# Patient Record
Sex: Male | Born: 1965
Health system: Southern US, Community
[De-identification: ages and names within clinical notes are randomized; demographics above are authoritative.]

## PROBLEM LIST (undated history)

## (undated) DIAGNOSIS — R319 Hematuria, unspecified: Secondary | ICD-10-CM

## (undated) DIAGNOSIS — Z87442 Personal history of urinary calculi: Secondary | ICD-10-CM

## (undated) DIAGNOSIS — I10 Essential (primary) hypertension: Secondary | ICD-10-CM

## (undated) DIAGNOSIS — D649 Anemia, unspecified: Secondary | ICD-10-CM

## (undated) DIAGNOSIS — G4733 Obstructive sleep apnea (adult) (pediatric): Secondary | ICD-10-CM

## (undated) DIAGNOSIS — E669 Obesity, unspecified: Secondary | ICD-10-CM

## (undated) DIAGNOSIS — N529 Male erectile dysfunction, unspecified: Secondary | ICD-10-CM

## (undated) DIAGNOSIS — R9431 Abnormal electrocardiogram [ECG] [EKG]: Secondary | ICD-10-CM

## (undated) DIAGNOSIS — E559 Vitamin D deficiency, unspecified: Secondary | ICD-10-CM

## (undated) HISTORY — DX: Obesity, unspecified: E66.9

## (undated) HISTORY — DX: Anemia, unspecified: D64.9

## (undated) HISTORY — PX: TONSILLECTOMY AND ADENOIDECTOMY: SHX28

## (undated) HISTORY — DX: Male erectile dysfunction, unspecified: N52.9

## (undated) HISTORY — DX: Obstructive sleep apnea (adult) (pediatric): G47.33

## (undated) HISTORY — DX: Vitamin D deficiency, unspecified: E55.9

## (undated) HISTORY — DX: Abnormal electrocardiogram (ECG) (EKG): R94.31

## (undated) HISTORY — PX: TONSILLECTOMY: SUR1361

## (undated) HISTORY — DX: Essential (primary) hypertension: I10

## (undated) HISTORY — DX: Hematuria, unspecified: R31.9

---

## 2011-03-01 ENCOUNTER — Emergency Department: Payer: Self-pay | Admitting: Emergency Medicine

## 2011-03-04 ENCOUNTER — Ambulatory Visit: Payer: Self-pay | Admitting: Orthopedic Surgery

## 2011-03-04 LAB — BASIC METABOLIC PANEL
Anion Gap: 10 (ref 7–16)
BUN: 16 mg/dL (ref 7–18)
Calcium, Total: 8.8 mg/dL (ref 8.5–10.1)
EGFR (African American): >60
EGFR (Non-African Amer.): >60
Glucose: 88 mg/dL (ref 65–99)
Osmolality: 276 (ref 275–301)
Sodium: 138 mmol/L (ref 136–145)

## 2011-03-04 LAB — PROTIME-INR: Prothrombin Time: 12.1 secs (ref 11.5–14.7)

## 2011-03-04 LAB — CBC
MCH: 27.8 pg (ref 26.0–34.0)
MCV: 83 fL (ref 80–100)
Platelet: 188 10*3/uL (ref 150–440)
RDW: 14.1 % (ref 11.5–14.5)
WBC: 7.7 10*3/uL (ref 3.8–10.6)

## 2011-03-04 LAB — SEDIMENTATION RATE: Erythrocyte Sed Rate: 4 mm/hr (ref 0–15)

## 2011-03-07 ENCOUNTER — Ambulatory Visit: Payer: Self-pay | Admitting: Orthopedic Surgery

## 2011-03-07 HISTORY — PX: ANKLE SURGERY: SHX546

## 2011-07-14 ENCOUNTER — Ambulatory Visit: Payer: Self-pay

## 2013-05-08 ENCOUNTER — Ambulatory Visit: Payer: Self-pay | Admitting: Family Medicine

## 2013-09-19 LAB — CBC AND DIFFERENTIAL
HEMATOCRIT: 41 % (ref 41–53)
Hemoglobin: 13.2 g/dL — AB (ref 13.5–17.5)
Neutrophils Absolute: 4 /uL
PLATELETS: 222 10*3/uL (ref 150–399)
WBC: 6.4 10^3/mL

## 2013-09-19 LAB — BASIC METABOLIC PANEL
BUN: 11 mg/dL (ref 4–21)
Creatinine: 1 mg/dL (ref 0.6–1.3)
Glucose: 88 mg/dL
Potassium: 4.3 mmol/L (ref 3.4–5.3)
SODIUM: 142 mmol/L (ref 137–147)

## 2013-09-19 LAB — HEPATIC FUNCTION PANEL
ALT: 15 U/L (ref 10–40)
AST: 15 U/L (ref 14–40)
Alkaline Phosphatase: 79 U/L (ref 25–125)
Bilirubin, Total: 0.3 mg/dL

## 2013-09-19 LAB — LIPID PANEL
Cholesterol: 178 mg/dL (ref 0–200)
HDL: 49 mg/dL (ref 35–70)
LDL CALC: 117 mg/dL
Triglycerides: 61 mg/dL (ref 40–160)

## 2013-09-19 LAB — TSH: TSH: 0.64 u[IU]/mL (ref 0.41–5.90)

## 2013-09-27 ENCOUNTER — Ambulatory Visit: Payer: Self-pay | Admitting: Family Medicine

## 2013-10-09 DIAGNOSIS — R1012 Left upper quadrant pain: Secondary | ICD-10-CM | POA: Insufficient documentation

## 2013-10-09 DIAGNOSIS — E559 Vitamin D deficiency, unspecified: Secondary | ICD-10-CM | POA: Insufficient documentation

## 2014-05-11 NOTE — Op Note (Signed)
PATIENT NAME:  Angel Chase, Angel Chase MR#:  [id number] DATE OF BIRTH:  03-Oct-1965  DATE OF PROCEDURE:  03/07/2011  PREOPERATIVE DIAGNOSIS: Left bimalleolar ankle fracture.   POSTOPERATIVE DIAGNOSIS: Left bimalleolar ankle fracture.  PROCEDURE: Open reduction internal fixation of left bimalleolar ankle fracture.   SURGEON: Timoteo Gaul, MD   ANESTHESIA: General.   TOURNIQUET TIME: 116 minutes.   ESTIMATED BLOOD LOSS: 50 mL.  IMPLANTS: Synthes seven hole 3.5 LCDC plate with two 4-0 cannulated screws for medial malleolar fixation.   INDICATIONS FOR PROCEDURE: The patient is a 49 year old male who sustained an injury to his left ankle at work when he was run over by a tractor. The patient had a bimalleolar ankle fracture by x-ray with some displacement. It was felt that the patient would be best served with surgical fixation of the bimalleolar ankle fracture. The patient understood the risks of surgery including infection, bleeding, nerve or blood vessel injury, ankle stiffness, osteoarthritis, persistent pain, failure of the hardware, malunion, nonunion, painful hardware requiring removal, wound healing problems, and the need for further surgery. Medical complications include DVT and pulmonary embolism, myocardial infarction, stroke, respiratory failure, pneumonia, and death. The patient understood these risks and wished to proceed with surgery.   PROCEDURE NOTE: The patient was met in the preop area. I signed the left leg with the word yes according to the hospital's right site protocol. This was after verbally confirming with the patient that the left side was the correct site of surgery.   I updated the patient's history and physical examination. I answered all the patient's questions. His wife is at the bedside. The patient was then taken to the operating room where he was placed supine on the operative table. All bony prominences were adequately padded. A tourniquet was applied to the left  upper thigh and the patient was prepped and draped in a sterile fashion.   A time-out was performed to verify the patient's name, date of birth, medical record number, correct site of surgery, and correct procedure to be performed. It was also used to verify the patient had received antibiotics and that all appropriate instruments, implants, and radiographic studies were available in the room. Once all in attendance were in agreement, the case began.   The patient's proposed surgical incisions were drawn out with a surgical marker after fluoroscopic images of the ankle were performed to verify the fracture locations. The patient then had his left lower extremity exsanguinated using an Esmarch. The tourniquet was applied to 275 mmHg and inflated for a total of 116 minutes. A #15 blade was then used to make a lateral incision centered over the fracture site. Care was taken to avoid injury to the superficial peroneal nerve. The subcutaneous tissues were carefully dissected using a Metzenbaum scissor and pick-up. This revealed the underlying fracture of the fibula. The fibular fracture was an oblique fracture extending in an oblique fashion through the coronal and sagittal planes. The fracture was cleared of overlying soft tissue and the hematoma was curetted and irrigated from the fracture site. The fracture was reduced with fracture clamps. The plate was then selected. It was felt that a seven hole LCDC plate would provide the best fixation for this fracture. The plate was slightly bent to allow for the flare of the distal end of the lateral malleolus. Two 3.5 mm bimalleolar screws were then placed above and below the fracture to fix the fracture. Additional proximal and distal screws were then placed. Despite fixation there was  still some instability at the fracture site. Therefore, a single locking screw was placed through the fracture in a medial and lateral direction to provide some stability to the sagittal  component of the fracture. A #2 fiber wire was also placed around the proximal fragment just proximal to the fracture site to help stabilize the fibula fracture given the comminution at the fracture site. The final distal 2.5 mm bicortical screws were then placed, one at the distalmost hole and one at the proximal most hole. Final images were performed. The patient had a slight degree of sag at the fracture site but the anterior cortex appeared anatomic. There was a slight posterior spike at the inferior aspect of the fracture line. The wound was copiously irrigated and a moist Ray-Tec was placed in the wound. The attention was then turned to the medial side. A vertical incision was made based over the medial malleolus. The incision extended just below the tip of the medial malleolus and extended just above the fracture site. The subcutaneous tissues were carefully dissected taking care to avoid any injury to the saphenous nerve or vein. The fracture again was curetted free of hematoma and irrigated carefully. A dental pick was used to perform a reduction of the medial malleolus. Prior to the fracture reduction, the joint was observed to have no evidence of focal chondral loss. With a dental pick holding fracture reduction, two K wires were placed in a parallel fashion through the medial malleolus and across the fracture site. Two cannulated long threaded 50 mm screws were placed across the fracture site allowing for reduction of the fracture. Final x-ray images were performed. The mortise was symmetric and there was no widening of the syndesmosis. With a cotton test the fibula showed no evidence of instability at the syndesmosis. The fracture of the fibula was above the syndesmosis. The position of the plate and screws did not involve any penetration into the ankle joint by x-ray. All wounds were copiously irrigated. The subcutaneous tissues were closed with 2-0 Vicryl and the skin approximated with staples. A dry  sterile dressing along with an AO splint was applied to the left lower extremity. The patient was then extubated and transferred to a hospital bed and brought to the PAC-U in stable condition. I was scrubbed and present for the entire case and all sharp and instrument counts were correct at the conclusion of the case.   I met with the patient's family in the postop recovery room to let them know the case had gone without complication and the patient was stable in the recovery room. The patient will remain nonweightbearing on the left lower extremity on crutches or a walker until he follows up with me in the office in 7 to 10 days. He will be started on enteric-coated aspirin 325 mg daily for DVT prophylaxis. He will be provided Percocet for pain control as well as Phenergan for postop nausea.   ____________________________ Timoteo Gaul, MD klk:drc D: 03/08/2011 12:28:39 ET T: 03/08/2011 12:49:53 ET JOB#: [id number]  cc: Timoteo Gaul, MD, <Dictator> Timoteo Gaul MD ELECTRONICALLY SIGNED 03/10/2011 17:00

## 2014-07-10 ENCOUNTER — Encounter (INDEPENDENT_AMBULATORY_CARE_PROVIDER_SITE_OTHER): Payer: Self-pay

## 2014-07-10 ENCOUNTER — Encounter: Payer: Self-pay | Admitting: Family Medicine

## 2014-07-10 ENCOUNTER — Ambulatory Visit (INDEPENDENT_AMBULATORY_CARE_PROVIDER_SITE_OTHER): Payer: BLUE CROSS/BLUE SHIELD | Admitting: Family Medicine

## 2014-07-10 VITALS — BP 128/80 | HR 90 | Temp 98.6°F | Resp 16 | Ht 73.0 in | Wt 308.1 lb

## 2014-07-10 DIAGNOSIS — E559 Vitamin D deficiency, unspecified: Secondary | ICD-10-CM

## 2014-07-10 DIAGNOSIS — I10 Essential (primary) hypertension: Secondary | ICD-10-CM | POA: Diagnosis not present

## 2014-07-10 DIAGNOSIS — N529 Male erectile dysfunction, unspecified: Secondary | ICD-10-CM | POA: Insufficient documentation

## 2014-07-10 DIAGNOSIS — R310 Gross hematuria: Secondary | ICD-10-CM | POA: Insufficient documentation

## 2014-07-10 MED ORDER — TELMISARTAN-HCTZ 80-12.5 MG PO TABS: 1.0000 | ORAL_TABLET | Freq: Every day | ORAL | Status: DC

## 2014-07-10 NOTE — Progress Notes (Signed)
Name: Angel Chase   MRN: [id number]    DOB: 25-Feb-1965   Date:07/10/2014       Progress Note  Subjective  Chief Complaint  Chief Complaint  Patient presents with  . Hypertension    Patient states that he is compliant with his medication and is not checking blood pressures at home  . Vitamin D Deficiency    Patient states that he has finished Vitamin D 40981 units weekly.    Hypertension This is a chronic problem. The problem is controlled. Pertinent negatives include no blurred vision, chest pain, headaches or palpitations. Past treatments include angiotensin blockers and diuretics. The current treatment provides significant improvement. There are no compliance problems.  There is no history of angina, kidney disease or CAD/MI.      Past Medical History  Diagnosis Date  . Vitamin D deficiency   . Obesity   . Obstructive sleep apnea   . Hypertension   . ED (erectile dysfunction)   . Hematuria   . EKG abnormalities     Past Surgical History  Procedure Laterality Date  . Ankle surgery Left 03/07/2011    Family History  Problem Relation Age of Onset  . Diabetes Father   . Breast cancer Mother   . Hypertension Father   . Hypertension Mother   . Pancreatic disease Sister   . Diabetes Sister   . Sleep apnea Sister     History   Social History  . Marital Status: Married    Spouse Name: Sherrie  . Number of Children: 1  . 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 Smoking status: Never Smoker   . Smokeless tobacco: Not on file  . Alcohol Use: No  . Drug Use: No  . Sexual Activity: Yes   Other Topics Concern  . Not on file   Social History Narrative     Current outpatient prescriptions:  .  tadalafil (CIALIS) 20 MG tablet, Take 1 tablet by mouth daily., Disp: , Rfl:  .  telmisartan-hydrochlorothiazide (MICARDIS HCT) 80-12.5 MG per tablet, Take 1 tablet by mouth daily., Disp: , Rfl:    No Known Allergies   Review of Systems  Eyes: Negative for blurred vision.  Cardiovascular: Negative for chest pain and palpitations.  Musculoskeletal: Negative for myalgias, back pain and joint pain.  Neurological: Negative for headaches.      Objective  Filed Vitals:   07/10/14 0847  BP: 128/80  Pulse: 90  Temp: 98.6 F (37 C)  Resp: 16  Height:  (1.854 m)  Weight: 308 lb 2 oz (139.765 kg)  SpO2: 96%    Physical Exam  Constitutional: He is oriented to person, place, and time and well-developed, well-nourished, and in no distress.  HENT:  Head: Normocephalic and atraumatic.  Cardiovascular: Normal rate and regular rhythm.   Pulmonary/Chest: Effort normal and breath sounds normal.  Musculoskeletal: He exhibits no edema.  Neurological: He is alert and oriented to person, place, and time.  Skin: Skin is warm and dry.  Nursing note and vitals reviewed.      No results found for this or any previous visit (from the past 2160 hour(s)).   Assessment & Plan 1. Essential hypertension Blood pressure at goal. Continue present therapy. - telmisartan-hydrochlorothiazide (MICARDIS HCT) 80-12.5 MG per tablet; Take 1 tablet by mouth daily.  Dispense: 90 tablet; Refill: 1  2. Avitaminosis D Patient has finished the 99  week prescription for vitamin D 50,000 units. Repeat levels today. - Vitamin D (25 hydroxy)  There are no diagnoses linked to this encounter.  Billi Bright Asad A. Faylene Kurtz [facility] 07/10/2014 8:59 AM

## 2014-07-11 ENCOUNTER — Telehealth: Payer: Self-pay

## 2014-07-11 LAB — VITAMIN D 25 HYDROXY (VIT D DEFICIENCY, FRACTURES): Vit D, 25-Hydroxy: 28.6 ng/mL — ABNORMAL LOW (ref 30.0–100.0)

## 2014-07-11 NOTE — Telephone Encounter (Signed)
-----   Message from Ellyn Hack, MD sent at 07/11/2014 10:34 AM EDT ----- Vitamin D level is improved from 19.2 to 28.6 to taking 50,000 units every week for 12 weeks. Patient may continue with OTC vitamin D 2000 IUs daily. Repeat levels in 3 months

## 2014-07-11 NOTE — Telephone Encounter (Signed)
Spoke with pt. Pt. Advised of results and verbalized understanding. Patient will call with any questions or concerns. [facility]

## 2014-07-23 ENCOUNTER — Other Ambulatory Visit: Payer: Self-pay | Admitting: Family Medicine

## 2014-10-13 ENCOUNTER — Encounter: Payer: Self-pay | Admitting: Family Medicine

## 2014-10-13 ENCOUNTER — Ambulatory Visit (INDEPENDENT_AMBULATORY_CARE_PROVIDER_SITE_OTHER): Payer: BLUE CROSS/BLUE SHIELD | Admitting: Family Medicine

## 2014-10-13 VITALS — BP 126/78 | HR 94 | Temp 98.6°F | Resp 17 | Ht 73.0 in | Wt 306.1 lb

## 2014-10-13 DIAGNOSIS — E559 Vitamin D deficiency, unspecified: Secondary | ICD-10-CM | POA: Diagnosis not present

## 2014-10-13 NOTE — Progress Notes (Signed)
Name: Angel Chase   MRN: [id number]    DOB: May 07, 1965   Date:10/13/2014       Progress Note  Subjective  Chief Complaint  Chief Complaint  Patient presents with  . Follow-up    3 mo  . Labs Only    fasting  . Hypertension    HPI Pt. Is here for Vitamin D deficiency. Level of Vitamin D was 28.6 in June 2016. He is taking OTC Vitamin D 2000 IUs. He feels well, no fatigue, myalgias, or other symptoms.  Past Medical History  Diagnosis Date  . Vitamin D deficiency   . Obesity   . Obstructive sleep apnea   . Hypertension   . ED (erectile dysfunction)   . Hematuria   . EKG abnormalities     Past Surgical History  Procedure Laterality Date  . Ankle surgery Left 03/07/2011    Family History  Problem Relation Age of Onset  . Diabetes Father   . Breast cancer Mother   . Hypertension Father   . Hypertension Mother   . Pancreatic disease Sister   . Diabetes Sister   . Sleep apnea Sister     Social History   Social History  . Marital Status: Married    Spouse Name: Sherrie  . Number of Children: 1  . 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 Smoking status: Never Smoker   . Smokeless tobacco: Not on file  . Alcohol Use: No  . Drug Use: No  . Sexual Activity: Yes   Other Topics Concern  . Not on file   Social History Narrative     Current outpatient prescriptions:  .  tadalafil (CIALIS) 20 MG tablet, Take 1 tablet by mouth daily., Disp: , Rfl:  .  telmisartan-hydrochlorothiazide (MICARDIS HCT) 80-12.5 MG per tablet, Take 1 tablet by mouth daily., Disp: 90 tablet, Rfl: 1  No Known Allergies   Review of Systems  Constitutional: Negative for fever, chills, weight loss and malaise/fatigue.   Objective  Filed Vitals:   10/13/14 0831  BP: 126/78  Pulse: 94  Temp: 98.6 F (37 C)  TempSrc: Oral  Resp: 17  Height:  (1.854 m)  Weight: 306 lb 1.6 oz (138.846 kg)  SpO2: 95%     Physical Exam  Constitutional: He is well-developed, well-nourished, and in no distress.  Cardiovascular: Normal rate and regular rhythm.   Pulmonary/Chest: Effort normal and breath sounds normal.  Abdominal: Soft. Bowel sounds are normal.  Musculoskeletal: He exhibits no edema.  Nursing note and vitals reviewed.   Assessment & Plan  1. Avitaminosis D  - Vitamin D (25 hydroxy)   Syed Asad A. Faylene Kurtz [facility] 10/13/2014 8:38 AM

## 2014-10-14 LAB — VITAMIN D 25 HYDROXY (VIT D DEFICIENCY, FRACTURES): Vit D, 25-Hydroxy: 29.7 ng/mL — ABNORMAL LOW (ref 30.0–100.0)

## 2014-12-31 ENCOUNTER — Telehealth: Payer: Self-pay | Admitting: Family Medicine

## 2014-12-31 DIAGNOSIS — I10 Essential (primary) hypertension: Secondary | ICD-10-CM

## 2014-12-31 NOTE — Telephone Encounter (Signed)
Patient had appointment for December, however, he had to reschedule due to provider not being in the office. He rescheduled for 01-27-15 and would like to know if you would refill his marcardis, he will not have enough to last until his appointment. Please send to [email or web address].

## 2015-01-02 MED ORDER — TELMISARTAN-HCTZ 80-12.5 MG PO TABS: 1.0000 | ORAL_TABLET | Freq: Every day | ORAL | Status: DC

## 2015-01-02 NOTE — Telephone Encounter (Signed)
Medication has been refilled and sent to [facility] °

## 2015-01-15 ENCOUNTER — Ambulatory Visit: Payer: BLUE CROSS/BLUE SHIELD | Admitting: Family Medicine

## 2015-01-27 ENCOUNTER — Ambulatory Visit (INDEPENDENT_AMBULATORY_CARE_PROVIDER_SITE_OTHER): Payer: BLUE CROSS/BLUE SHIELD | Admitting: Family Medicine

## 2015-01-27 ENCOUNTER — Encounter: Payer: Self-pay | Admitting: Family Medicine

## 2015-01-27 VITALS — BP 128/76 | HR 97 | Temp 98.7°F | Resp 16 | Ht 73.0 in | Wt 315.9 lb

## 2015-01-27 DIAGNOSIS — E785 Hyperlipidemia, unspecified: Secondary | ICD-10-CM

## 2015-01-27 DIAGNOSIS — I10 Essential (primary) hypertension: Secondary | ICD-10-CM | POA: Diagnosis not present

## 2015-01-27 DIAGNOSIS — E559 Vitamin D deficiency, unspecified: Secondary | ICD-10-CM

## 2015-01-27 NOTE — Progress Notes (Deleted)
Subjective:     Patient ID: Angel Chase, male   DOB: [birth year], 50 y.o.   MRN: [id number]  Hypertension This is a chronic problem. The problem is unchanged. The problem is controlled. Pertinent negatives include no blurred vision, chest pain, headaches, palpitations or shortness of breath. Past treatments include angiotensin blockers and diuretics.  Hyperlipidemia This is a chronic problem. The problem is uncontrolled. Recent lipid tests were reviewed and are high. Pertinent negatives include no chest pain or shortness of breath. He is currently on no antihyperlipidemic treatment.   Pt. Is also here for Vitamin D deficiency. Last level obtained in September 2016 was 29.6. Pt. Has been on Vitamin D OTC therapy.   Review of Systems  Eyes: Negative for blurred vision.  Respiratory: Negative for shortness of breath.   Cardiovascular: Negative for chest pain and palpitations.  Neurological: Negative for headaches.       Objective:   Physical Exam  Constitutional: He is oriented to person, place, and time. He appears well-developed.  HENT:  Head: Normocephalic and atraumatic.  Cardiovascular: Normal rate and regular rhythm.   Pulmonary/Chest: Effort normal and breath sounds normal.  Abdominal: Soft. Bowel sounds are normal.  Neurological: He is alert and oriented to person, place, and time.  Nursing note and vitals reviewed.      Assessment:     ***    Plan:     ***

## 2015-01-27 NOTE — Progress Notes (Signed)
Name: Angel Chase   MRN: [id number]    DOB: [birth year]   Date:01/27/2015       Progress Note  Subjective  Chief Complaint  Chief Complaint  Patient presents with  . Follow-up    3 mo  . Hypertension    Hypertension This is a chronic problem. The problem is unchanged. The problem is controlled. Pertinent negatives include no blurred vision, chest pain, headaches, malaise/fatigue, palpitations or shortness of breath. Risk factors for coronary artery disease include obesity, male gender and dyslipidemia. Past treatments include angiotensin blockers and diuretics. The current treatment provides significant improvement. There are no compliance problems.  There is no history of kidney disease, CAD/MI or CVA.  Hyperlipidemia This is a chronic problem. The problem is uncontrolled. Recent lipid tests were reviewed and are high. Exacerbating diseases include obesity. Pertinent negatives include no chest pain, myalgias or shortness of breath. He is currently on no antihyperlipidemic treatment. Risk factors for coronary artery disease include dyslipidemia, male sex, hypertension and obesity.   Pt. Is also here for Vitamin D deficiency. Last level obtained in September 2016 was 29.6. Pt. Has been on Vitamin D OTC therapy. We will repeat levels today.    Past Medical History  Diagnosis Date  . Vitamin D deficiency   . Obesity   . Obstructive sleep apnea   . Hypertension   . ED (erectile dysfunction)   . Hematuria   . EKG abnormalities     Past Surgical History  Procedure Laterality Date  . Ankle surgery Left 03/07/2011    Family History  Problem Relation Age of Onset  . Diabetes Father   . Breast cancer Mother   . Hypertension Father   . Hypertension Mother   . Pancreatic disease Sister   . Diabetes Sister   . Sleep apnea Sister     Social History   Social History  . Marital Status: Married    Spouse Name: Angel Chase  . Number of Children: 1  . 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 Smoking status: Never Smoker   . Smokeless tobacco: Not on file  . Alcohol Use: No  . Drug Use: No  . Sexual Activity: Yes   Other Topics Concern  . Not on file   Social History Narrative     Current outpatient prescriptions:  .  tadalafil (CIALIS) 20 MG tablet, Take 1 tablet by mouth daily., Disp: , Rfl:  .  telmisartan-hydrochlorothiazide (MICARDIS HCT) 80-12.5 MG tablet, Take 1 tablet by mouth daily., Disp: 90 tablet, Rfl: 0  No Known Allergies   Review of Systems  Constitutional: Negative for fever, chills, weight loss and malaise/fatigue.  Eyes: Negative for blurred vision.  Respiratory: Negative for shortness of breath.   Cardiovascular: Negative for chest pain and palpitations.  Musculoskeletal: Negative for myalgias.  Neurological: Negative for headaches.    Objective  Filed Vitals:   01/27/15 1044  BP: 128/76  Pulse: 97  Temp: 98.7 F (37.1 C)  TempSrc: Oral  Resp: 16  Height: 6\' 1"  (1.854 m)  Weight: 315 lb 14.4 oz (143.291 kg)  SpO2: 97%    Physical Exam  Constitutional: He is oriented to person, place, and time. He appears well-developed.  HENT:  Head: Normocephalic and atraumatic.  Cardiovascular: Normal rate and regular rhythm.  Pulmonary/Chest: Effort normal and breath sounds normal.  Abdominal: Soft. Bowel sounds are normal.  Neurological: He is alert  and oriented to person, place, and time.  Nursing note and vitals reviewed   Assessment & Plan  1. Avitaminosis D Recheck levels today. - Vitamin D (25 hydroxy)  2. Essential hypertension BP stable and controlled on present antihypertensive therapy.  3. Hyperlipidemia Obtain FLP and liver enzymes. - Comprehensive Metabolic Panel (CMET) - Lipid Profile   Angel Chase Angel Chase Angel Chase [facility] 01/27/2015 8:25 PM

## 2015-01-28 LAB — COMPREHENSIVE METABOLIC PANEL
ALBUMIN: 3.9 g/dL (ref 3.5–5.5)
ALT: 21 IU/L (ref 0–44)
AST: 12 IU/L (ref 0–40)
Albumin/Globulin Ratio: 1.4 (ref 1.1–2.5)
Alkaline Phosphatase: 89 IU/L (ref 39–117)
BUN / CREAT RATIO: 11 (ref 9–20)
BUN: 11 mg/dL (ref 6–24)
Bilirubin Total: 0.3 mg/dL (ref 0.0–1.2)
CALCIUM: 9.2 mg/dL (ref 8.7–10.2)
CO2: 24 mmol/L (ref 18–29)
CREATININE: 1 mg/dL (ref 0.76–1.27)
Chloride: 99 mmol/L (ref 96–106)
GFR calc non Af Amer: 89 mL/min/{1.73_m2} (ref >59)
GFR, EST AFRICAN AMERICAN: 103 mL/min/{1.73_m2} (ref >59)
GLOBULIN, TOTAL: 2.8 g/dL (ref 1.5–4.5)
Glucose: 85 mg/dL (ref 65–99)
Potassium: 4.5 mmol/L (ref 3.5–5.2)
SODIUM: 141 mmol/L (ref 134–144)
TOTAL PROTEIN: 6.7 g/dL (ref 6.0–8.5)

## 2015-01-28 LAB — LIPID PANEL
CHOL/HDL RATIO: 3.8 ratio (ref 0.0–5.0)
Cholesterol, Total: 189 mg/dL (ref 100–199)
HDL: 50 mg/dL (ref >39)
LDL CALC: 124 mg/dL — AB (ref 0–99)
Triglycerides: 73 mg/dL (ref 0–149)
VLDL Cholesterol Cal: 15 mg/dL (ref 5–40)

## 2015-01-28 LAB — VITAMIN D 25 HYDROXY (VIT D DEFICIENCY, FRACTURES): Vit D, 25-Hydroxy: 28.7 ng/mL — ABNORMAL LOW (ref 30.0–100.0)

## 2015-03-04 ENCOUNTER — Telehealth: Payer: Self-pay | Admitting: Family Medicine

## 2015-03-04 NOTE — Telephone Encounter (Signed)
PT SAID AT HIS LAST OFFICE VISIT THAT HE TO CALL VIT D BUT PT SAID THAT THE PHARM ( [facility] IN [place]) SAYS THAT THEY NEVER DID RECEIVE IT. PLEASE LET HIM KNOW WHAT HAPPENED.

## 2015-03-06 MED ORDER — VITAMIN D (ERGOCALCIFEROL) 1.25 MG (50000 UNIT) PO CAPS: 50000.0000 [IU] | ORAL_CAPSULE | ORAL | Status: DC

## 2015-03-06 NOTE — Telephone Encounter (Signed)
Prescription for Vitamin D 50,000 units has been sent to [facility]

## 2015-04-06 ENCOUNTER — Other Ambulatory Visit: Payer: Self-pay | Admitting: Family Medicine

## 2015-04-27 ENCOUNTER — Telehealth: Payer: Self-pay | Admitting: Family Medicine

## 2015-04-27 NOTE — Telephone Encounter (Signed)
Pt states he went to [facility] to get his DOT physical done and states their was blood in his urine. Pt states he has always had this problem and it is noted in his chart. Pt is requesting a note to be written by Dr Sherryll BurgerShah explaining that although he has blood in his urine that it is safe for him to drive and he does not need to be under any restrictions. Please advise pt.

## 2015-04-28 NOTE — Telephone Encounter (Signed)
Routed to Dr. Shah for Advice ° °

## 2015-04-28 NOTE — Telephone Encounter (Signed)
Please request records from his DOT physical, especially the urinalysis.

## 2015-05-05 ENCOUNTER — Telehealth: Payer: Self-pay | Admitting: Family Medicine

## 2015-05-05 NOTE — Telephone Encounter (Signed)
Patient checking status to see if you have submitted his DOT paperwork in. Please return call [phone]

## 2015-05-06 ENCOUNTER — Telehealth: Payer: Self-pay | Admitting: Family Medicine

## 2015-05-06 ENCOUNTER — Encounter: Payer: Self-pay | Admitting: Family Medicine

## 2015-05-06 NOTE — Telephone Encounter (Signed)
PT HAS CALLED AND WANTING TO CHECK ON THE STATUS OF HIS LETTER TO BE TURNED INTO THE DOT (THIS HAS TO BE DONE BY FIRST OF NEXT WEEK). YOU HAD REQUESTED THE [facility] URINE AND IT HAS BEEN SENT. PT SAID THAT HE NEEDS THIS LETTER SO THAT HE CAN CONTINUE TO WORK. PLEASE LET HIM KNOW SOON.  pLEASE SEE ALL THE MESSAGES FROM THE LAST WEEK ON THIS.

## 2015-05-06 NOTE — Telephone Encounter (Signed)
Letter regarding blood in the urine and specialists' notes are ready for pickup.

## 2015-05-12 NOTE — Telephone Encounter (Signed)
Paper work has been submitted to Dr. Sherryll BurgerShah will contact patient once paper work has been completed

## 2015-07-08 ENCOUNTER — Other Ambulatory Visit: Payer: Self-pay | Admitting: Family Medicine

## 2015-07-24 ENCOUNTER — Ambulatory Visit (INDEPENDENT_AMBULATORY_CARE_PROVIDER_SITE_OTHER): Payer: BLUE CROSS/BLUE SHIELD | Admitting: Family Medicine

## 2015-07-24 ENCOUNTER — Encounter: Payer: Self-pay | Admitting: Family Medicine

## 2015-07-24 VITALS — BP 123/80 | HR 86 | Temp 98.0°F | Resp 15 | Ht 73.0 in | Wt 314.6 lb

## 2015-07-24 DIAGNOSIS — E559 Vitamin D deficiency, unspecified: Secondary | ICD-10-CM | POA: Diagnosis not present

## 2015-07-24 DIAGNOSIS — I10 Essential (primary) hypertension: Secondary | ICD-10-CM | POA: Diagnosis not present

## 2015-07-24 DIAGNOSIS — E785 Hyperlipidemia, unspecified: Secondary | ICD-10-CM

## 2015-07-24 NOTE — Progress Notes (Signed)
Name: Angel JanskyDaryl Eric Skates   MRN: [id number]    DOB: [birth year]   Date:07/24/2015       Progress Note  Subjective  Chief Complaint  Chief Complaint  Patient presents with  . Follow-up    6 mo    Hypertension This is a chronic problem. The problem is unchanged. The problem is controlled. Pertinent negatives include no blurred vision, chest pain, headaches or palpitations. Past treatments include angiotensin blockers and diuretics. The current treatment provides significant improvement. There are no compliance problems.  There is no history of kidney disease, CAD/MI or CVA.     Past Medical History  Diagnosis Date  . Vitamin D deficiency   . Obesity   . Obstructive sleep apnea   . Hypertension   . ED (erectile dysfunction)   . Hematuria   . EKG abnormalities     Past Surgical History  Procedure Laterality Date  . Ankle surgery Left 03/07/2011    Family History  Problem Relation Age of Onset  . Diabetes Father   . Breast cancer Mother   . Hypertension Father   . Hypertension Mother   . Pancreatic disease Sister   . Diabetes Sister   . Sleep apnea Sister     Social History   Social History  . Marital Status: Married    Spouse Name: Sherrie  . Number of Children: 1  . Years of Education: N/A   Occupational History  . Utility IT [employer]    Social History Main Topics  . Smoking status: Never Smoker   . Smokeless tobacco: Not on file  . Alcohol Use: No  . Drug Use: No  . Sexual Activity: Yes   Other Topics Concern  . Not on file   Social History Narrative     Current outpatient prescriptions:  .  tadalafil (CIALIS) 20 MG tablet, Take 1 tablet by mouth daily., Disp: , Rfl:  .  telmisartan-hydrochlorothiazide (MICARDIS HCT) 80-12.5 MG tablet, TAKE 1 TABLET BY MOUTH DAILY, Disp: 90 tablet, Rfl: 0 .  Vitamin D, Ergocalciferol, (DRISDOL) 50000 units CAPS capsule, Take 1 capsule (50,000 Units total) by mouth once a week. For 12 weeks (Patient not taking:  Reported on 07/24/2015), Disp: 12 capsule, Rfl: 1  No Known Allergies   Review of Systems  Eyes: Negative for blurred vision.  Cardiovascular: Negative for chest pain and palpitations.  Neurological: Negative for headaches.     Objective  Filed Vitals:   07/24/15 1132  BP: 123/80  Pulse: 86  Temp: 98 F (36.7 C)  TempSrc: Oral  Resp: 15  Height: 6\' 1"  (1.854 m)  Weight: 314 lb 9.6 oz (142.702 kg)  SpO2: 93%    Physical Exam  Constitutional: He is oriented to person, place, and time and well-developed, well-nourished, and in no distress.  HENT:  Head: Normocephalic and atraumatic.  Cardiovascular: Normal rate, regular rhythm and normal heart sounds.   No murmur heard. Pulmonary/Chest: Effort normal and breath sounds normal. He has no wheezes.  Abdominal: Soft. Bowel sounds are normal. There is no tenderness.  Musculoskeletal: He exhibits edema.  1+ pitting edema bilateral LE  Neurological: He is alert and oriented to person, place, and time.  Skin: Skin is warm and dry.  Nursing note and vitals reviewed.      Assessment & Plan  1. Avitaminosis D  - Vitamin D (25 hydroxy)  2. Essential hypertension BP stable and controlled on present anti-hypertensive therapy  3. Hyperlipidemia Elevated LDL, recheck and manage accordingly. -  Lipid Profile    Malvina Schadler Asad A. Faylene KurtzShah [facility] 07/24/2015 11:53 AM

## 2015-07-25 LAB — LIPID PANEL
CHOL/HDL RATIO: 3.4 ratio (ref <=5.0)
CHOLESTEROL: 175 mg/dL (ref 125–200)
HDL: 51 mg/dL (ref >=40)
LDL Cholesterol: 113 mg/dL (ref <130)
TRIGLYCERIDES: 53 mg/dL (ref <150)
VLDL: 11 mg/dL (ref <30)

## 2015-07-25 LAB — VITAMIN D 25 HYDROXY (VIT D DEFICIENCY, FRACTURES): Vit D, 25-Hydroxy: 27 ng/mL — ABNORMAL LOW (ref 30–100)

## 2015-07-27 ENCOUNTER — Other Ambulatory Visit: Payer: Self-pay | Admitting: Family Medicine

## 2015-07-27 DIAGNOSIS — E559 Vitamin D deficiency, unspecified: Secondary | ICD-10-CM

## 2015-07-27 MED ORDER — VITAMIN D (ERGOCALCIFEROL) 1.25 MG (50000 UNIT) PO CAPS: 50000.0000 [IU] | ORAL_CAPSULE | ORAL | Status: DC

## 2015-07-29 ENCOUNTER — Telehealth: Payer: Self-pay

## 2015-07-29 MED ORDER — VITAMIN D (ERGOCALCIFEROL) 1.25 MG (50000 UNIT) PO CAPS: 50000.0000 [IU] | ORAL_CAPSULE | ORAL | Status: DC

## 2015-07-29 NOTE — Telephone Encounter (Signed)
Lab results have been reported to patient and a prescription for Vitamin D 50,000 units take 1 capsule once a week for 12 weeks has been sent to [facility] [place] per Dr. Sherryll BurgerShah, pt has been notified

## 2015-10-08 ENCOUNTER — Other Ambulatory Visit: Payer: Self-pay | Admitting: Family Medicine

## 2016-01-06 ENCOUNTER — Other Ambulatory Visit: Payer: Self-pay | Admitting: Family Medicine

## 2016-01-08 NOTE — Telephone Encounter (Signed)
Patient needs medication refill appointment and he has been notified

## 2016-01-13 ENCOUNTER — Telehealth: Payer: Self-pay | Admitting: Family Medicine

## 2016-01-13 MED ORDER — TELMISARTAN-HCTZ 80-12.5 MG PO TABS: 1.0000 | ORAL_TABLET | Freq: Every day | ORAL | 0 refills | Status: DC

## 2016-01-13 NOTE — Telephone Encounter (Signed)
Pt needs refill on Telmisartan-hydrochlorothiazie to be sent to [facility] [place]. Pt understands he needs an appt, however he without insurance until Feb and is asking if enough could be called in until Feb 21, 2016. Pt does have an appt scheduled for Feb. Please advise.

## 2016-01-13 NOTE — Telephone Encounter (Signed)
Prescription for telmisartan-hydrochlorothiazide is sent for 90 days to patient's pharmacy

## 2016-01-14 NOTE — Telephone Encounter (Signed)
Pt informed

## 2016-02-05 ENCOUNTER — Ambulatory Visit
Admission: EM | Admit: 2016-02-05 | Discharge: 2016-02-05 | Disposition: A | Discharge status: Home / Self Care | Payer: Self-pay | Attending: Family Medicine | Admitting: Family Medicine

## 2016-02-05 ENCOUNTER — Ambulatory Visit: Payer: Self-pay

## 2016-02-05 ENCOUNTER — Encounter: Payer: Self-pay | Admitting: Emergency Medicine

## 2016-02-05 ENCOUNTER — Ambulatory Visit
Admission: RE | Admit: 2016-02-05 | Discharge: 2016-02-05 | Disposition: A | Discharge status: Home / Self Care | Payer: Self-pay | Source: Ambulatory Visit | Attending: Family Medicine | Admitting: Family Medicine

## 2016-02-05 DIAGNOSIS — K5732 Diverticulitis of large intestine without perforation or abscess without bleeding: Secondary | ICD-10-CM

## 2016-02-05 DIAGNOSIS — D6489 Other specified anemias: Secondary | ICD-10-CM

## 2016-02-05 DIAGNOSIS — R1032 Left lower quadrant pain: Secondary | ICD-10-CM

## 2016-02-05 LAB — COMPREHENSIVE METABOLIC PANEL
ALBUMIN: 3.9 g/dL (ref 3.5–5.0)
ALT: 18 U/L (ref 17–63)
AST: 17 U/L (ref 15–41)
Alkaline Phosphatase: 83 U/L (ref 38–126)
Anion gap: 9 (ref 5–15)
BUN: 11 mg/dL (ref 6–20)
CHLORIDE: 97 mmol/L — AB (ref 101–111)
CO2: 24 mmol/L (ref 22–32)
CREATININE: 0.91 mg/dL (ref 0.61–1.24)
Calcium: 8.7 mg/dL — ABNORMAL LOW (ref 8.9–10.3)
GFR calc non Af Amer: >60 mL/min (ref >60)
GLUCOSE: 96 mg/dL (ref 65–99)
Potassium: 3.5 mmol/L (ref 3.5–5.1)
SODIUM: 130 mmol/L — AB (ref 135–145)
Total Bilirubin: 0.7 mg/dL (ref 0.3–1.2)
Total Protein: 7.5 g/dL (ref 6.5–8.1)

## 2016-02-05 LAB — CBC WITH DIFFERENTIAL/PLATELET
BASOS ABS: 0.1 10*3/uL (ref 0–0.1)
BASOS PCT: 1 %
Eosinophils Absolute: 0.1 10*3/uL (ref 0–0.7)
Eosinophils Relative: 1 %
HEMATOCRIT: 36.5 % — AB (ref 40.0–52.0)
HEMOGLOBIN: 11.6 g/dL — AB (ref 13.0–18.0)
LYMPHS PCT: 15 %
Lymphs Abs: 1.5 10*3/uL (ref 1.0–3.6)
MCH: 23.3 pg — ABNORMAL LOW (ref 26.0–34.0)
MCHC: 31.9 g/dL — ABNORMAL LOW (ref 32.0–36.0)
MCV: 73.1 fL — ABNORMAL LOW (ref 80.0–100.0)
Monocytes Absolute: 1.2 10*3/uL — ABNORMAL HIGH (ref 0.2–1.0)
Monocytes Relative: 12 %
NEUTROS ABS: 7.6 10*3/uL — AB (ref 1.4–6.5)
Neutrophils Relative %: 71 %
Platelets: 283 10*3/uL (ref 150–440)
RBC: 4.99 MIL/uL (ref 4.40–5.90)
RDW: 16.5 % — AB (ref 11.5–14.5)
WBC: 10.6 10*3/uL (ref 3.8–10.6)

## 2016-02-05 LAB — LIPASE, BLOOD: Lipase: 24 U/L (ref 11–51)

## 2016-02-05 LAB — AMYLASE: AMYLASE: 84 U/L (ref 28–100)

## 2016-02-05 MED ORDER — IOPAMIDOL (ISOVUE-370) INJECTION 76%
125.0000 mL | Freq: Once | INTRAVENOUS | Status: AC | PRN
  Administered 2016-02-05: 125 mL via INTRAVENOUS

## 2016-02-05 NOTE — ED Provider Notes (Signed)
[facility]    CSN: [id number] Arrival date & time: 02/05/16  1519     History   Chief Complaint Chief Complaint  Patient presents with  . Abdominal Pain    HPI Angel Chase is a 51 y.o. male.   She is here because of abdominal pain. He states he started having abdominal pain on Wednesday. He thinks he may be constipated the last time he had a regular bowel movement was on Wednesday. He states that despite having the bowel movement Wednesday he still felt constipated and gave himself 2 enemas that were unsuccessful. Reports pain is in his lower left abdomen. There is a strong family history diverticulitis with several family members with diverticulitis he had a brother in December was diagnosed diverticulitis. Reports no nausea or vomiting and no fever. He's never had a colonoscopy before. He has history of erectile dysfunction hematuria hypertension obesity and obstructive sleep apnea. He also has a history of hyperlipidemia. He's had ankle surgery before in the past. Also strong family history diverticulitis mother's had breast cancer hypertension father said diabetes and hypertension and sister also has diabetes. He has never smoked and no known drug allergies.   The history is provided by the patient. No language interpreter was used.  Abdominal Cramping  This is a new problem. The current episode started more than 2 days ago. The problem occurs constantly. The problem has been gradually worsening. Associated symptoms include abdominal pain. Pertinent negatives include no chest pain, no headaches and no shortness of breath. Nothing aggravates the symptoms. Nothing relieves the symptoms. He has tried nothing for the symptoms. The treatment provided no relief.    Past Medical History:  Diagnosis Date  . ED (erectile dysfunction)   . EKG abnormalities   . Hematuria   . Hypertension   . Obesity   . Obstructive sleep apnea   . Vitamin D deficiency     Patient  Active Problem List   Diagnosis Date Noted  . Hyperlipidemia 01/27/2015  . Benign essential hematuria 07/10/2014  . Failure of erection 07/10/2014  . Encounter for examination for driving license 40/[id number]/1191  . BP (high blood pressure) 07/10/2014  . Extreme obesity (HCC) 10/09/2013  . Avitaminosis D 10/09/2013    Past Surgical History:  Procedure Laterality Date  . ANKLE SURGERY Left 03/07/2011       Home Medications    Prior to Admission medications   Medication Sig Start Date End Date Taking? Authorizing Provider  tadalafil (CIALIS) 20 MG tablet Take 1 tablet by mouth daily.    Historical Provider, MD  telmisartan-hydrochlorothiazide (MICARDIS HCT) 80-12.5 MG tablet Take 1 tablet by mouth daily. 01/13/16   Ellyn Hack, MD  Vitamin D, Ergocalciferol, (DRISDOL) 50000 units CAPS capsule Take 1 capsule (50,000 Units total) by mouth once a week. For 12 weeks 07/29/15   Ellyn Hack, MD    Family History Family History  Problem Relation Age of Onset  . Breast cancer Mother   . Hypertension Mother   . Diabetes Father   . Hypertension Father   . Pancreatic disease Sister   . Diabetes Sister   . Sleep apnea Sister     Social History Social History  Substance Use Topics  . Smoking status: Never Smoker  . Smokeless tobacco: Not on file  . Alcohol use No     Allergies   Patient has no known allergies.   Review of Systems Review of Systems  Respiratory: Negative for  shortness of breath.   Cardiovascular: Negative for chest pain.  Gastrointestinal: Positive for abdominal pain and constipation.  Neurological: Negative for headaches.  All other systems reviewed and are negative.    Physical Exam Triage Vital Signs ED Triage Vitals  Enc Vitals Group     BP 02/05/16 1549 135/84     Pulse Rate 02/05/16 1549 99     Resp 02/05/16 1549 16     Temp 02/05/16 1549 98.3 F (36.8 C)     Temp Source 02/05/16 1549 Oral     SpO2 02/05/16 1549 99 %     Weight  02/05/16 1550 (!) 315 lb (142.9 kg)     Height 02/05/16 1550 5\' 11"  (1.803 m)     Head Circumference --      Peak Flow --      Pain Score --      Pain Loc --      Pain Edu? --      Excl. in GC? --    No data found.   Updated Vital Signs BP 135/84 (BP Location: Left Arm)   Pulse 99   Temp 98.3 F (36.8 C) (Oral)   Resp 16   Ht 5\' 11"  (1.803 m)   Wt (!) 315 lb (142.9 kg)   SpO2 99%   BMI 43.93 kg/m   Visual Acuity Right Eye Distance:   Left Eye Distance:   Bilateral Distance:    Right Eye Near:   Left Eye Near:    Bilateral Near:     Physical Exam  Constitutional: He is oriented to person, place, and time. He appears well-developed and well-nourished.  HENT:  Head: Normocephalic and atraumatic.  Eyes: Pupils are equal, round, and reactive to light.  Neck: Normal range of motion. Neck supple. No thyromegaly present.  Cardiovascular: Normal rate, regular rhythm and normal heart sounds.   Pulmonary/Chest: Effort normal.  Abdominal: Soft. Normal appearance. There is no hepatosplenomegaly. There is tenderness in the left lower quadrant. There is no CVA tenderness. No hernia.    Musculoskeletal: Normal range of motion.  Lymphadenopathy:    He has no cervical adenopathy.  Neurological: He is alert and oriented to person, place, and time.  Skin: Skin is warm.  Psychiatric: He has a normal mood and affect.  Vitals reviewed.    UC Treatments / Results  Labs (all labs ordered are listed, but only abnormal results are displayed) Labs Reviewed  CBC WITH DIFFERENTIAL/PLATELET - Abnormal; Notable for the following:       Result Value   Hemoglobin 11.6 (*)    HCT 36.5 (*)    MCV 73.1 (*)    MCH 23.3 (*)    MCHC 31.9 (*)    RDW 16.5 (*)    Neutro Abs 7.6 (*)    Monocytes Absolute 1.2 (*)    All other components within normal limits  COMPREHENSIVE METABOLIC PANEL - Abnormal; Notable for the following:    Sodium 130 (*)    Chloride 97 (*)    Calcium 8.7 (*)    All  other components within normal limits  LIPASE, BLOOD  AMYLASE    EKG  EKG Interpretation None       Radiology Ct Abdomen Pelvis W Contrast  Result Date: 02/05/2016 CLINICAL DATA:  LEFT lower quadrant pain for 2 days. EXAM: CT ABDOMEN AND PELVIS WITH CONTRAST TECHNIQUE: Multidetector CT imaging of the abdomen and pelvis was performed using the standard protocol following bolus administration of intravenous contrast. CONTRAST:  125 cc Isovue 370 COMPARISON:  Abdominal ultrasound September 27, 2013 FINDINGS: LOWER CHEST: Lung bases are clear. Included heart size is normal. No pericardial effusion. HEPATOBILIARY: Liver and gallbladder are normal. PANCREAS: Normal. SPLEEN: Normal. ADRENALS/URINARY TRACT: Kidneys are orthotopic, demonstrating symmetric enhancement. No nephrolithiasis, hydronephrosis or solid renal masses. 5.8 cm RIGHT lower pole benign-appearing cysts. 16 mm LEFT interpolar cyst. The unopacified ureters are normal in course and caliber. Delayed imaging through the kidneys demonstrates symmetric prompt contrast excretion within the proximal urinary collecting system. Urinary bladder is partially distended and unremarkable. Normal adrenal glands. STOMACH/BOWEL: Mild sigmoid diverticulosis with superimposed short segment of circumferential wall thickening, extensive pericolonic fat stranding. The stomach, small bowel are normal in course and caliber without inflammatory changes. Normal appendix. VASCULAR/LYMPHATIC: Aortoiliac vessels are normal in course and caliber. No lymphadenopathy by CT size criteria. REPRODUCTIVE: Normal. OTHER: No intraperitoneal free fluid or free air. MUSCULOSKELETAL: Nonacute. Small fat containing umbilical hernia. Moderate to severe lower lumbar facet arthropathy. Severe L5-S1 neural foraminal narrowing. IMPRESSION: Acute sigmoid diverticulitis without complication. These results will be called to the ordering clinician or representative by the Radiologist  Assistant, and communication documented in the z[employer] Dashboard Electronically Signed   By: Awilda Metro M.D.   On: 02/05/2016 19:42    Procedures Procedures (including critical care time)  Medications Ordered in UC Medications - No data to display  Results for orders placed or performed during the hospital encounter of 02/05/16  CBC with Differential  Result Value Ref Range   WBC 10.6 3.8 - 10.6 K/uL   RBC 4.99 4.40 - 5.90 MIL/uL   Hemoglobin 11.6 (L) 13.0 - 18.0 g/dL   HCT 40.9 (L) 81.1 - 91.4 %   MCV 73.1 (L) 80.0 - 100.0 fL   MCH 23.3 (L) 26.0 - 34.0 pg   MCHC 31.9 (L) 32.0 - 36.0 g/dL   RDW 78.2 (H) 95.6 - 21.3 %   Platelets 283 150 - 440 K/uL   Neutrophils Relative % 71 %   Neutro Abs 7.6 (H) 1.4 - 6.5 K/uL   Lymphocytes Relative 15 %   Lymphs Abs 1.5 1.0 - 3.6 K/uL   Monocytes Relative 12 %   Monocytes Absolute 1.2 (H) 0.2 - 1.0 K/uL   Eosinophils Relative 1 %   Eosinophils Absolute 0.1 0 - 0.7 K/uL   Basophils Relative 1 %   Basophils Absolute 0.1 0 - 0.1 K/uL  Lipase, blood  Result Value Ref Range   Lipase 24 11 - 51 U/L  Amylase  Result Value Ref Range   Amylase 84 28 - 100 U/L  Comprehensive metabolic panel  Result Value Ref Range   Sodium 130 (L) 135 - 145 mmol/L   Potassium 3.5 3.5 - 5.1 mmol/L   Chloride 97 (L) 101 - 111 mmol/L   CO2 24 22 - 32 mmol/L   Glucose, Bld 96 65 - 99 mg/dL   BUN 11 6 - 20 mg/dL   Creatinine, Ser 0.86 0.61 - 1.24 mg/dL   Calcium 8.7 (L) 8.9 - 10.3 mg/dL   Total Protein 7.5 6.5 - 8.1 g/dL   Albumin 3.9 3.5 - 5.0 g/dL   AST 17 15 - 41 U/L   ALT 18 17 - 63 U/L   Alkaline Phosphatase 83 38 - 126 U/L   Total Bilirubin 0.7 0.3 - 1.2 mg/dL   GFR calc non Af Amer >60 >60 mL/min   GFR calc Af Amer >60 >60 mL/min   Anion gap 9 5 -  15   Initial Impression / Assessment and Plan / UC Course  I have reviewed the triage vital signs and the nursing notes.  Pertinent labs & imaging results that were available during my care of  the patient were reviewed by me and considered in my medical decision making (see chart for details). Patient gives a good history of diverticulitis with a strong family history as well. He is also anemic and his white count is high normal. Amylase lipase normal. Will schedule for CT scan of the abdomen and pelvis discussed with him and his wife he has no current insurance but does have both his and his wife's Benicar and the will to use that to pay for CT scan. Initially was thinking about getting a three-way of the abdomen but with his history and the fact white count is high normal at history and anemia will proceed with CT scan of the abdomen and pelvis. And then agreement with that.     Final Clinical Impressions(s) / UC Diagnoses   Final diagnoses:  Left lower quadrant pain  Anemia due to other cause, not classified  Sigmoid diverticulitis    New Prescriptions Discharge Medication List as of 02/05/2016  8:11 PM      CT of the abdomen and pelvis showed sigmoid diverticulitis. We'll place on Cipro 500 mg twice day for 10 days Flagyl 500 mg twice day for 10 days as well. Strongly suggest to him and his wife that he have rectal examination after this is cleared up when he sees his PCP when his has insurance again for his yearly physical to make sure there is no blood in stool at 51 years of age and with the family history diverticulitis and him having a colonoscopy also not be a bad idea and will suggest they consider doing that as well.    Note: This dictation was prepared with Dragon dictation along with smaller phrase technology. Any transcriptional errors that result from this process are unintentional.   Hassan Rowan, MD 02/05/16 2145

## 2016-02-05 NOTE — ED Triage Notes (Signed)
Pt reports left lower quadrant abdominal pain that started Wednesday. Pt reports decreased bowel movements attempted 2 enemas without success.

## 2016-03-04 ENCOUNTER — Ambulatory Visit (INDEPENDENT_AMBULATORY_CARE_PROVIDER_SITE_OTHER): Payer: BLUE CROSS/BLUE SHIELD | Admitting: Family Medicine

## 2016-03-04 ENCOUNTER — Encounter: Payer: Self-pay | Admitting: Family Medicine

## 2016-03-04 VITALS — BP 132/90 | HR 100 | Temp 98.9°F | Resp 18 | Ht 71.0 in | Wt 306.3 lb

## 2016-03-04 DIAGNOSIS — Z8719 Personal history of other diseases of the digestive system: Secondary | ICD-10-CM | POA: Insufficient documentation

## 2016-03-04 DIAGNOSIS — K5733 Diverticulitis of large intestine without perforation or abscess with bleeding: Secondary | ICD-10-CM | POA: Diagnosis not present

## 2016-03-04 DIAGNOSIS — E559 Vitamin D deficiency, unspecified: Secondary | ICD-10-CM | POA: Diagnosis not present

## 2016-03-04 DIAGNOSIS — I10 Essential (primary) hypertension: Secondary | ICD-10-CM

## 2016-03-04 DIAGNOSIS — Z23 Encounter for immunization: Secondary | ICD-10-CM | POA: Diagnosis not present

## 2016-03-04 MED ORDER — TELMISARTAN-HCTZ 80-25 MG PO TABS: 1.0000 | ORAL_TABLET | Freq: Every day | ORAL | 0 refills | Status: DC

## 2016-03-04 NOTE — Progress Notes (Signed)
Name: Angel JanskyDaryl Eric Chase   MRN: [id number]    DOB: 1965/04/25   Date:03/04/2016       Progress Note  Subjective  Chief Complaint  Chief Complaint  Patient presents with  . Hypertension    follow up, medication refills    Hypertension  This is a chronic problem. The problem is unchanged. The problem is controlled. Associated symptoms include anxiety (had a stressful week at the job, also recovered froma cute diverticulitis.). Pertinent negatives include no blurred vision, chest pain, headaches, palpitations or shortness of breath. Past treatments include angiotensin blockers and diuretics. There is no history of kidney disease, CAD/MI, CVA or heart failure.     Past Medical History:  Diagnosis Date  . ED (erectile dysfunction)   . EKG abnormalities   . Hematuria   . Hypertension   . Obesity   . Obstructive sleep apnea   . Vitamin D deficiency     Past Surgical History:  Procedure Laterality Date  . ANKLE SURGERY Left 03/07/2011    Family History  Problem Relation Age of Onset  . Breast cancer Mother   . Hypertension Mother   . Diabetes Father   . Hypertension Father   . Pancreatic disease Sister   . Diabetes Sister   . Sleep apnea Sister     Social History   Social History  . Marital status: Married    Spouse name: Sherrie  . Number of children: 1  . Years of education: N/A   Occupational History  . [employer]    Social History Main Topics  . Smoking status: Never Smoker  . Smokeless tobacco: Never Used  . Alcohol use No  . Drug use: No  . Sexual activity: Yes   Other Topics Concern  . Not on file   Social History Narrative  . No narrative on file     Current Outpatient Prescriptions:  .  telmisartan-hydrochlorothiazide (MICARDIS HCT) 80-12.5 MG tablet, Take 1 tablet by mouth daily., Disp: 90 tablet, Rfl: 0 .  tadalafil (CIALIS) 20 MG tablet, Take 1 tablet by mouth daily., Disp: , Rfl:   No Known Allergies   Review of Systems   Eyes: Negative for blurred vision.  Respiratory: Negative for shortness of breath.   Cardiovascular: Negative for chest pain and palpitations.  Neurological: Negative for headaches.    Objective  Vitals:   03/04/16 1026  BP: (!) 148/82  Pulse: 100  Resp: 18  Temp: 98.9 F (37.2 C)  TempSrc: Oral  SpO2: 98%  Weight: (!) 306 lb 4.8 oz (138.9 kg)  Height: 5\' 11"  (1.803 m)    Physical Exam  Constitutional: He is oriented to person, place, and time and well-developed, well-nourished, and in no distress.  HENT:  Head: Normocephalic and atraumatic.  Cardiovascular: Normal rate, regular rhythm and normal heart sounds.   No murmur heard. Pulmonary/Chest: Effort normal and breath sounds normal. He has no wheezes.  Abdominal: Soft. Bowel sounds are normal. There is no tenderness.  Musculoskeletal: He exhibits edema (trace bilateral pitting ankle edema).  1+ pitting edema bilateral LE  Neurological: He is alert and oriented to person, place, and time.  Skin: Skin is warm and dry.  Nursing note and vitals reviewed.   Assessment & Plan  1. Need for influenza vaccination  - Flu Vaccine QUAD 36+ mos PF IM (Fluarix & Fluzone Quad PF)  2. Essential hypertension Blood pressure is elevated, will increase hydrochlorothiazide to 25 mg daily, new prescription sent to pharmacy. Recheck  in 4 weeks - telmisartan-hydrochlorothiazide (MICARDIS HCT) 80-25 MG tablet; Take 1 tablet by mouth daily.  Dispense: 90 tablet; Refill: 0  3. Diverticulitis large intestine w/o perforation or abscess w/bleeding Notes from urgent care reviewed and discussed with patient in detail, patient will return for complete physical exam and will schedule colonoscopy  4. Avitaminosis D  - VITAMIN D 25 Hydroxy (Vit-D Deficiency, Fractures)   Carrell Palmatier Asad [facility] 03/04/2016 10:45 AM

## 2016-03-05 LAB — VITAMIN D 25 HYDROXY (VIT D DEFICIENCY, FRACTURES): Vit D, 25-Hydroxy: 36 ng/mL (ref 30–100)

## 2016-03-28 ENCOUNTER — Ambulatory Visit (INDEPENDENT_AMBULATORY_CARE_PROVIDER_SITE_OTHER): Payer: BLUE CROSS/BLUE SHIELD | Admitting: Family Medicine

## 2016-03-28 ENCOUNTER — Encounter: Payer: Self-pay | Admitting: Family Medicine

## 2016-03-28 VITALS — BP 132/84 | HR 106 | Temp 98.7°F | Resp 17 | Ht 71.0 in | Wt 300.0 lb

## 2016-03-28 DIAGNOSIS — Z0000 Encounter for general adult medical examination without abnormal findings: Secondary | ICD-10-CM

## 2016-03-28 LAB — COMPLETE METABOLIC PANEL WITH GFR
ALBUMIN: 4 g/dL (ref 3.6–5.1)
ALK PHOS: 81 U/L (ref 40–115)
ALT: 16 U/L (ref 9–46)
AST: 16 U/L (ref 10–40)
BILIRUBIN TOTAL: 0.3 mg/dL (ref 0.2–1.2)
BUN: 11 mg/dL (ref 7–25)
CALCIUM: 9 mg/dL (ref 8.6–10.3)
CO2: 29 mmol/L (ref 20–31)
CREATININE: 1.02 mg/dL (ref 0.60–1.35)
Chloride: 101 mmol/L (ref 98–110)
GFR, Est African American: >89 mL/min (ref >=60)
GFR, Est Non African American: 87 mL/min (ref >=60)
Glucose, Bld: 98 mg/dL (ref 65–99)
Potassium: 3.9 mmol/L (ref 3.5–5.3)
Sodium: 138 mmol/L (ref 135–146)
TOTAL PROTEIN: 6.8 g/dL (ref 6.1–8.1)

## 2016-03-28 LAB — CBC WITH DIFFERENTIAL/PLATELET
BASOS ABS: 73 {cells}/uL (ref 0–200)
Basophils Relative: 1 %
EOS PCT: 1 %
Eosinophils Absolute: 73 cells/uL (ref 15–500)
HEMATOCRIT: 37.3 % — AB (ref 38.5–50.0)
HEMOGLOBIN: 11.5 g/dL — AB (ref 13.2–17.1)
LYMPHS ABS: 1606 {cells}/uL (ref 850–3900)
Lymphocytes Relative: 22 %
MCH: 23 pg — ABNORMAL LOW (ref 27.0–33.0)
MCHC: 30.8 g/dL — ABNORMAL LOW (ref 32.0–36.0)
MCV: 74.7 fL — ABNORMAL LOW (ref 80.0–100.0)
MONO ABS: 803 {cells}/uL (ref 200–950)
MPV: 9.9 fL (ref 7.5–12.5)
Monocytes Relative: 11 %
NEUTROS ABS: 4745 {cells}/uL (ref 1500–7800)
Neutrophils Relative %: 65 %
Platelets: 290 10*3/uL (ref 140–400)
RBC: 4.99 MIL/uL (ref 4.20–5.80)
RDW: 15.4 % — ABNORMAL HIGH (ref 11.0–15.0)
WBC: 7.3 10*3/uL (ref 3.8–10.8)

## 2016-03-28 LAB — LIPID PANEL
CHOLESTEROL: 174 mg/dL (ref <200)
HDL: 48 mg/dL (ref >40)
LDL Cholesterol: 114 mg/dL — ABNORMAL HIGH (ref <100)
Total CHOL/HDL Ratio: 3.6 Ratio (ref <5.0)
Triglycerides: 60 mg/dL (ref <150)
VLDL: 12 mg/dL (ref <30)

## 2016-03-28 LAB — TSH: TSH: 0.92 m[IU]/L (ref 0.40–4.50)

## 2016-03-28 LAB — PSA: PSA: 0.7 ng/mL (ref <=4.0)

## 2016-03-28 NOTE — Progress Notes (Signed)
Name: Angel Chase   MRN: [id number]    DOB: 1965-06-16   Date:03/28/2016       Progress Note  Subjective  Chief Complaint  Chief Complaint  Patient presents with  . Annual Exam    CPE    HPI  Pt. Presents for Complete Physical Exam.  Never had colonoscopy and no one has hx of colon cancer in his family. He is due for DRE and PSA today.   Past Medical History:  Diagnosis Date  . ED (erectile dysfunction)   . EKG abnormalities   . Hematuria   . Hypertension   . Obesity   . Obstructive sleep apnea   . Vitamin D deficiency     Past Surgical History:  Procedure Laterality Date  . ANKLE SURGERY Left 03/07/2011    Family History  Problem Relation Age of Onset  . Breast cancer Mother   . Hypertension Mother   . Diabetes Father   . Hypertension Father   . Pancreatic disease Sister   . Diabetes Sister   . Sleep apnea Sister     Social History   Social History  . Marital status: Married    Spouse name: Sherrie  . Number of children: 1  . 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 Smoking status: Never Smoker  . Smokeless tobacco: Never Used  . Alcohol use No  . Drug use: No  . Sexual activity: Yes   Other Topics Concern  . Not on file   Social History Narrative  . No narrative on file     Current Outpatient Prescriptions:  .  tadalafil (CIALIS) 20 MG tablet, Take 1 tablet by mouth daily., Disp: , Rfl:  .  telmisartan-hydrochlorothiazide (MICARDIS HCT) 80-25 MG tablet, Take 1 tablet by mouth daily., Disp: 90 tablet, Rfl: 0  No Known Allergies   Review of Systems  Constitutional: Negative for chills, fever, malaise/fatigue and weight loss.  HENT: Negative for congestion and sinus pain.   Eyes: Negative for blurred vision and double vision.  Respiratory: Negative for cough and shortness of breath.   Cardiovascular: Negative for chest pain and leg swelling.  Gastrointestinal:  Negative for abdominal pain, blood in stool, constipation and diarrhea.  Genitourinary: Positive for hematuria (hx of hematuria, work up with Urology.).  Musculoskeletal: Negative for back pain, joint pain and neck pain.  Neurological: Negative for dizziness and headaches.  Psychiatric/Behavioral: Negative for depression. The patient is not nervous/anxious and does not have insomnia.      Objective  Vitals:   03/28/16 0905  BP: 132/84  Pulse: (!) 106  Resp: 17  Temp: 98.7 F (37.1 C)  TempSrc: Oral  SpO2: 97%  Weight: 300 lb (136.1 kg)  Height: 5\' 11"  (1.803 m)    Physical Exam  Constitutional: He is oriented to person, place, and time and well-developed, well-nourished, and in no distress.  HENT:  Head: Normocephalic and atraumatic.  Cardiovascular: Normal rate, regular rhythm and normal heart sounds.   No murmur heard. Pulmonary/Chest: Effort normal and breath sounds normal. He has no wheezes.  Abdominal: Soft. Bowel sounds are normal. There is no tenderness.  Genitourinary: Rectum normal and prostate normal.  Neurological: He is alert and oriented to person, place, and time.  Skin: Skin is warm and dry.  Psychiatric: Mood, memory, affect and judgment normal.  Nursing note and vitals reviewed.    Assessment & Plan  1.  Encounter for annual physical exam Obtain age-appropriate lab screenings.  - CBC with Differential/Platelet - COMPLETE METABOLIC PANEL WITH GFR - Lipid panel - TSH - VITAMIN D 25 Hydroxy (Vit-D Deficiency, Fractures) - PSA   Tavie Haseman Asad [facility] 03/28/2016 9:11 AM

## 2016-03-29 LAB — VITAMIN D 25 HYDROXY (VIT D DEFICIENCY, FRACTURES): VIT D 25 HYDROXY: 33 ng/mL (ref 30–100)

## 2016-04-28 ENCOUNTER — Ambulatory Visit (INDEPENDENT_AMBULATORY_CARE_PROVIDER_SITE_OTHER): Payer: BLUE CROSS/BLUE SHIELD | Admitting: Family Medicine

## 2016-04-28 ENCOUNTER — Encounter: Payer: Self-pay | Admitting: Family Medicine

## 2016-04-28 VITALS — BP 135/81 | HR 99 | Temp 98.6°F | Resp 17 | Ht 71.0 in | Wt 304.0 lb

## 2016-04-28 DIAGNOSIS — Z8719 Personal history of other diseases of the digestive system: Secondary | ICD-10-CM | POA: Diagnosis not present

## 2016-04-28 DIAGNOSIS — D649 Anemia, unspecified: Secondary | ICD-10-CM | POA: Diagnosis not present

## 2016-04-28 DIAGNOSIS — Z862 Personal history of diseases of the blood and blood-forming organs and certain disorders involving the immune mechanism: Secondary | ICD-10-CM | POA: Insufficient documentation

## 2016-04-28 DIAGNOSIS — R310 Gross hematuria: Secondary | ICD-10-CM

## 2016-04-28 LAB — CBC WITH DIFFERENTIAL/PLATELET
BASOS PCT: 1 %
Basophils Absolute: 68 cells/uL (ref 0–200)
EOS ABS: 68 {cells}/uL (ref 15–500)
Eosinophils Relative: 1 %
HEMATOCRIT: 35.9 % — AB (ref 38.5–50.0)
HEMOGLOBIN: 10.9 g/dL — AB (ref 13.2–17.1)
LYMPHS ABS: 1700 {cells}/uL (ref 850–3900)
LYMPHS PCT: 25 %
MCH: 22 pg — ABNORMAL LOW (ref 27.0–33.0)
MCHC: 30.4 g/dL — ABNORMAL LOW (ref 32.0–36.0)
MCV: 72.5 fL — AB (ref 80.0–100.0)
MONO ABS: 612 {cells}/uL (ref 200–950)
MPV: 9.3 fL (ref 7.5–12.5)
Monocytes Relative: 9 %
Neutro Abs: 4352 cells/uL (ref 1500–7800)
Neutrophils Relative %: 64 %
Platelets: 295 10*3/uL (ref 140–400)
RBC: 4.95 MIL/uL (ref 4.20–5.80)
RDW: 15.5 % — ABNORMAL HIGH (ref 11.0–15.0)
WBC: 6.8 10*3/uL (ref 3.8–10.8)

## 2016-04-28 LAB — IRON AND TIBC
%SAT: 4 % — AB (ref 15–60)
Iron: 18 ug/dL — ABNORMAL LOW (ref 50–180)
TIBC: 407 ug/dL (ref 250–425)
UIBC: 389 ug/dL (ref 125–400)

## 2016-04-28 LAB — RETICULOCYTES
ABS Retic: 64350 cells/uL (ref 25000–90000)
RBC.: 4.95 MIL/uL (ref 4.20–5.80)
RETIC CT PCT: 1.3 %

## 2016-04-28 NOTE — Progress Notes (Signed)
Name: Angel Chase   MRN: [id number]    DOB: December 20, 1965   Date:04/28/2016       Progress Note  Subjective  Chief Complaint  Chief Complaint  Patient presents with  . Follow-up    abnormal labs - anemia    Anemia  Presents for initial visit. There has been no abdominal pain, light-headedness, malaise/fatigue, pallor or weight loss. (Has blood in urine for many years, now seems to be getting worse with more vlots of blood being passed in the urine.)  There is no history of chronic liver disease, chronic renal disease, clotting disorder or hemoglobinopathy. There is no past history of colonoscopy.    Past Medical History:  Diagnosis Date  . ED (erectile dysfunction)   . EKG abnormalities   . Hematuria   . Hypertension   . Obesity   . Obstructive sleep apnea   . Vitamin D deficiency     Past Surgical History:  Procedure Laterality Date  . ANKLE SURGERY Left 03/07/2011    Family History  Problem Relation Age of Onset  . Breast cancer Mother   . Hypertension Mother   . Diabetes Father   . Hypertension Father   . Pancreatic disease Sister   . Diabetes Sister   . Sleep apnea Sister     Social History   Social History  . Marital status: Married    Spouse name: Sherrie  . Number of children: 1  . 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 Smoking status: Never Smoker  . Smokeless tobacco: Never Used  . Alcohol use No  . Drug use: No  . Sexual activity: Yes   Other Topics Concern  . Not on file   Social History Narrative  . No narrative on file     Current Outpatient Prescriptions:  .  telmisartan-hydrochlorothiazide (MICARDIS HCT) 80-25 MG tablet, Take 1 tablet by mouth daily., Disp: 90 tablet, Rfl: 0 .  tadalafil (CIALIS) 20 MG tablet, Take 1 tablet by mouth daily., Disp: , Rfl:   No Known Allergies   Review of Systems  Constitutional: Negative for malaise/fatigue and weight loss.   Gastrointestinal: Negative for abdominal pain.  Skin: Negative for pallor.  Neurological: Negative for light-headedness.      Objective  Vitals:   04/28/16 0934  BP: 135/81  Pulse: 99  Resp: 17  Temp: 98.6 F (37 C)  TempSrc: Oral  SpO2: 97%  Weight: (!) 304 lb (137.9 kg)  Height:  (1.803 m)    Physical Exam  Constitutional: He is oriented to person, place, and time and well-developed, well-nourished, and in no distress.  HENT:  Head: Normocephalic and atraumatic.  Cardiovascular: Normal rate, regular rhythm and normal heart sounds.   No murmur heard. Pulmonary/Chest: Effort normal and breath sounds normal. He has no wheezes.  Abdominal: Soft. Bowel sounds are normal. There is no tenderness.  Musculoskeletal: He exhibits no edema.  1+ pitting edema bilateral LE  Neurological: He is alert and oriented to person, place, and time.  Skin: Skin is warm and dry.  Psychiatric: Mood, memory, affect and judgment normal.  Nursing note and vitals reviewed.       Assessment & Plan  1. History of diverticulitis of colon Needs diagnostic colonoscopy after an episode of diverticulitis of colon which is now resolved - Ambulatory referral to Gastroenterology  2. Anemia, unspecified type Suspect iron deficiency given episodes of hematuria,  obtain iron studies and treat accordingly - CBC with Differential/Platelet - Iron - Iron Binding Cap (TIBC) - Ferritin - Transferrin - Retic - RBC Folate  3. Hematuria, gross  - Ambulatory referral to Urology  [facility] 04/28/2016 9:40 AM

## 2016-04-29 LAB — FOLATE RBC: RBC Folate: 655 ng/mL (ref >280)

## 2016-04-29 LAB — FERRITIN: FERRITIN: 6 ng/mL — AB (ref 20–380)

## 2016-05-02 LAB — TRANSFERRIN: Transferrin: 294 mg/dL (ref 188–341)

## 2016-05-06 ENCOUNTER — Other Ambulatory Visit: Payer: Self-pay | Admitting: Family Medicine

## 2016-05-06 DIAGNOSIS — D649 Anemia, unspecified: Secondary | ICD-10-CM

## 2016-05-06 NOTE — Progress Notes (Signed)
Referral entered  

## 2016-05-17 ENCOUNTER — Inpatient Hospital Stay: Payer: BLUE CROSS/BLUE SHIELD | Attending: Oncology | Admitting: Oncology

## 2016-05-17 ENCOUNTER — Telehealth: Payer: Self-pay | Admitting: *Deleted

## 2016-05-17 ENCOUNTER — Inpatient Hospital Stay: Payer: BLUE CROSS/BLUE SHIELD

## 2016-05-17 ENCOUNTER — Encounter: Payer: Self-pay | Admitting: Oncology

## 2016-05-17 VITALS — BP 156/84 | HR 91 | Temp 95.5°F | Resp 18 | Ht 71.5 in | Wt 305.0 lb

## 2016-05-17 DIAGNOSIS — G4730 Sleep apnea, unspecified: Secondary | ICD-10-CM

## 2016-05-17 DIAGNOSIS — K5732 Diverticulitis of large intestine without perforation or abscess without bleeding: Secondary | ICD-10-CM

## 2016-05-17 DIAGNOSIS — R319 Hematuria, unspecified: Secondary | ICD-10-CM

## 2016-05-17 DIAGNOSIS — Z803 Family history of malignant neoplasm of breast: Secondary | ICD-10-CM | POA: Diagnosis not present

## 2016-05-17 DIAGNOSIS — E669 Obesity, unspecified: Secondary | ICD-10-CM

## 2016-05-17 DIAGNOSIS — I10 Essential (primary) hypertension: Secondary | ICD-10-CM

## 2016-05-17 DIAGNOSIS — D500 Iron deficiency anemia secondary to blood loss (chronic): Secondary | ICD-10-CM

## 2016-05-17 DIAGNOSIS — Z79899 Other long term (current) drug therapy: Secondary | ICD-10-CM

## 2016-05-17 DIAGNOSIS — E785 Hyperlipidemia, unspecified: Secondary | ICD-10-CM

## 2016-05-17 DIAGNOSIS — D559 Anemia due to enzyme disorder, unspecified: Secondary | ICD-10-CM | POA: Diagnosis not present

## 2016-05-17 DIAGNOSIS — N529 Male erectile dysfunction, unspecified: Secondary | ICD-10-CM

## 2016-05-17 LAB — CBC
HEMATOCRIT: 32.6 % — AB (ref 40.0–52.0)
Hemoglobin: 10.4 g/dL — ABNORMAL LOW (ref 13.0–18.0)
MCH: 22 pg — AB (ref 26.0–34.0)
MCHC: 32 g/dL (ref 32.0–36.0)
MCV: 68.8 fL — AB (ref 80.0–100.0)
Platelets: 306 10*3/uL (ref 150–440)
RBC: 4.74 MIL/uL (ref 4.40–5.90)
RDW: 16.2 % — AB (ref 11.5–14.5)
WBC: 6.8 10*3/uL (ref 3.8–10.6)

## 2016-05-17 NOTE — Progress Notes (Signed)
Here for new pt evaluation.  

## 2016-05-17 NOTE — Progress Notes (Signed)
Please let patient know that he needs to try oral iron before IV. Cbc stable

## 2016-05-17 NOTE — Telephone Encounter (Signed)
-----   Message from Creig Hines, MD sent at 05/17/2016  2:33 PM EDT ----- Please let patient know that he needs to try oral iron before IV. Cbc stable

## 2016-05-17 NOTE — Telephone Encounter (Signed)
Called pt to let him know that his hgb is not normal but it high enough that he should start the oral iron and he does not need the IV iron at this point. He was instructed to take iron pill twice daily. He is agreeable to the plan

## 2016-05-17 NOTE — Progress Notes (Signed)
05/18/2016 10:27 AM   Angel Chase 08/06/1965 [id number]  Referring provider: Ellyn Hack, MD [address] [place], [place] [address]  Chief Complaint  Patient presents with  . New Patient (Initial Visit)    gross hematuria referred by Dr. Brayton El    HPI: Patient is a 51 -year-old [place] American male who presents today as a referral from their PCP, Dr. Sherryll Burger, for gross hematuria.    Patient has a long history of gross hematuria with clots x 20 years.  So far, hematuria workups have not discovered any etiology.  He ha been evaluated by Dr. Lonna Cobb, [facility] and nephrology.  He has had kidney biopsies, CTU, renal angiograms and cystoscopies.    He does not have a prior history of recurrent urinary tract infections, nephrolithiasis, trauma to the genitourinary tract, BPH or malignancies of the genitourinary tract.   He does not have a family medical history of nephrolithiasis, malignancies of the genitourinary tract or hematuria.     Today, he is not having symptoms of frequent urination, urgency, dysuria, nocturia, incontinence, hesitancy, intermittency, straining to urinate or a weak urinary stream.  His UA today demonstrates > 30 RBC's and many bacteria.    He is not experiencing any suprapubic pain, abdominal pain or flank pain. He denies any recent fevers, chills, nausea or vomiting.   Contrast CT performed on 02/05/2016 for LLQ pain noted kidneys are orthotopic, demonstrating symmetric enhancement. No nephrolithiasis, hydronephrosis or solid renal masses. 5.8 cm RIGHT lower pole benign-appearing cysts. 16 mm LEFT interpolar cyst. The unopacified ureters are normal in course and caliber. Delayed imaging through the kidneys demonstrates symmetric prompt contrast excretion within the proximal urinary collecting system. Urinary bladder is partially distended and unremarkable. Normal adrenal glands.  Acute sigmoid diverticulitis without complication.  I have  independently reviewed the films.    He is not a smoker.  He was exposed to secondhand smoke as a child.  He has worked with pesticides and fertilizers.  He has HTN.   He has a high BMI.    His current PSA was 0.7 ng/mL on 03/28/2016.     PMH: Past Medical History:  Diagnosis Date  . ED (erectile dysfunction)   . EKG abnormalities   . Hematuria   . Hematuria    x 20 yr per pt  . Hypertension   . Obesity   . Obstructive sleep apnea   . Vitamin D deficiency     Surgical History: Past Surgical History:  Procedure Laterality Date  . ANKLE SURGERY Left 03/07/2011    Home Medications:  Allergies as of 05/18/2016   No Known Allergies     Medication List       Accurate as of 05/18/16 10:27 AM. Always use your most recent med list.          CIALIS 20 MG tablet Generic drug:  tadalafil Take 1 tablet by mouth daily.   ferrous sulfate 325 (65 FE) MG EC tablet Take 325 mg by mouth 2 (two) times daily with a meal.   MULTI-VITAMIN DAILY PO Take by mouth.   telmisartan-hydrochlorothiazide 80-25 MG tablet Commonly known as:  MICARDIS HCT Take 1 tablet by mouth daily.       Allergies: No Known Allergies  Family History: Family History  Problem Relation Age of Onset  . Breast cancer Mother   . Hypertension Mother   . Diabetes Father   . Hypertension Father   . Hypertension Sister   .  Pancreatic disease Sister   . Diabetes Sister   . Hypertension Sister   . Hypertension Brother   . Diverticulosis Brother   . Prostate cancer Maternal Uncle   . Prostate cancer Maternal Uncle   . Kidney cancer Neg Hx   . Bladder Cancer Neg Hx     Social History:  reports that he has never smoked. He has never used smokeless tobacco. He reports that he does not drink alcohol or use drugs.  ROS: UROLOGY Frequent Urination?: No Hard to postpone urination?: No Burning/pain with urination?: No Get up at night to urinate?: No Leakage of urine?: No Urine stream starts and stops?:  No Trouble starting stream?: No Do you have to strain to urinate?: No Blood in urine?: Yes Urinary tract infection?: No Sexually transmitted disease?: No Injury to kidneys or bladder?: No Painful intercourse?: No Weak stream?: No Erection problems?: No Penile pain?: No  Gastrointestinal Nausea?: No Vomiting?: No Indigestion/heartburn?: No Diarrhea?: No Constipation?: No  Constitutional Fever: No Night sweats?: No Weight loss?: No Fatigue?: No  Skin Skin rash/lesions?: No Itching?: No  Eyes Blurred vision?: No Double vision?: No  Ears/Nose/Throat Sore throat?: No Sinus problems?: No  Hematologic/Lymphatic Swollen glands?: No Easy bruising?: No  Cardiovascular Leg swelling?: No Chest pain?: No  Respiratory Cough?: No Shortness of breath?: No  Endocrine Excessive thirst?: No  Musculoskeletal Back pain?: No Joint pain?: No  Neurological Headaches?: No Dizziness?: No  Psychologic Depression?: No Anxiety?: No  Physical Exam: BP 131/83   Pulse 93   Ht 5\' 11"  (1.803 m)   Wt (!) 303 lb 9.6 oz (137.7 kg)   BMI 42.34 kg/m   Constitutional: Well nourished. Alert and oriented, No acute distress. HEENT: [place] AT, moist mucus membranes. Trachea midline, no masses. Cardiovascular: No clubbing, cyanosis, or edema. Respiratory: Normal respiratory effort, no increased work of breathing. GI: Abdomen is soft, non tender, non distended, no abdominal masses. Liver and spleen not palpable.  No hernias appreciated.  Stool sample for occult testing is not indicated.   GU: No CVA tenderness.  No bladder fullness or masses.  Patient with circumcised phallus.   Urethral meatus is patent.  No penile discharge. No penile lesions or rashes. Scrotum without lesions, cysts, rashes and/or edema.  Testicles are located scrotally bilaterally. No masses are appreciated in the testicles. Left and right epididymis are normal. Rectal: Patient with  normal sphincter tone. Anus and  perineum without scarring or rashes. No rectal masses are appreciated. Prostate is approximately 55 grams, no nodules are appreciated. Seminal vesicles are normal. Skin: No rashes, bruises or suspicious lesions. Lymph: No cervical or inguinal adenopathy. Neurologic: Grossly intact, no focal deficits, moving all 4 extremities. Psychiatric: Normal mood and affect.  Laboratory Data: Lab Results  Component Value Date   WBC 6.8 05/17/2016   HGB 10.4 (L) 05/17/2016   HCT 32.6 (L) 05/17/2016   MCV 68.8 (L) 05/17/2016   PLT 306 05/17/2016    Lab Results  Component Value Date   CREATININE 1.02 03/28/2016    Lab Results  Component Value Date   PSA 0.7 03/28/2016    Lab Results  Component Value Date   TSH 0.92 03/28/2016       Component Value Date/Time   CHOL 174 03/28/2016 0950   CHOL 189 01/27/2015 1205   HDL 48 03/28/2016 0950   HDL 50 01/27/2015 1205   CHOLHDL 3.6 03/28/2016 0950   VLDL 12 03/28/2016 0950   LDLCALC 114 (H) 03/28/2016 0950  LDLCALC 124 (H) 01/27/2015 1205    Lab Results  Component Value Date   AST 16 03/28/2016   Lab Results  Component Value Date   ALT 16 03/28/2016     Urinalysis > 30 RBC's.  Many bacteria.  See EPIC  Pertinent Imaging: CLINICAL DATA:  LEFT lower quadrant pain for 2 days.  EXAM: CT ABDOMEN AND PELVIS WITH CONTRAST  TECHNIQUE: Multidetector CT imaging of the abdomen and pelvis was performed using the standard protocol following bolus administration of intravenous contrast.  CONTRAST:  125 cc Isovue 370  COMPARISON:  Abdominal ultrasound September 27, 2013  FINDINGS: LOWER CHEST: Lung bases are clear. Included heart size is normal. No pericardial effusion.  HEPATOBILIARY: Liver and gallbladder are normal.  PANCREAS: Normal.  SPLEEN: Normal.  ADRENALS/URINARY TRACT: Kidneys are orthotopic, demonstrating symmetric enhancement. No nephrolithiasis, hydronephrosis or solid renal masses. 5.8 cm RIGHT lower  pole benign-appearing cysts. 16 mm LEFT interpolar cyst. The unopacified ureters are normal in course and caliber. Delayed imaging through the kidneys demonstrates symmetric prompt contrast excretion within the proximal urinary collecting system. Urinary bladder is partially distended and unremarkable. Normal adrenal glands.  STOMACH/BOWEL: Mild sigmoid diverticulosis with superimposed short segment of circumferential wall thickening, extensive pericolonic fat stranding. The stomach, small bowel are normal in course and caliber without inflammatory changes. Normal appendix.  VASCULAR/LYMPHATIC: Aortoiliac vessels are normal in course and caliber. No lymphadenopathy by CT size criteria.  REPRODUCTIVE: Normal.  OTHER: No intraperitoneal free fluid or free air.  MUSCULOSKELETAL: Nonacute. Small fat containing umbilical hernia. Moderate to severe lower lumbar facet arthropathy. Severe L5-S1 neural foraminal narrowing.  IMPRESSION: Acute sigmoid diverticulitis without complication.  These results will be called to the ordering clinician or representative by the Radiologist Assistant, and communication documented in the z[employer] Dashboard   Electronically Signed   By: Awilda Metro M.D.   On: 02/05/2016 19:42   Assessment & Plan:    1. Gross hematuria  - I explained to the patient that there are a number of causes that can be associated with blood in the urine, such as stones,  BPH, UTI's, damage to the urinary tract and/or cancer.  - At this time, I felt that the patient warranted further urologic evaluation.   The AUA guidelines state that a CT urogram is the preferred imaging study to evaluate hematuria.  - I explained to the patient that the CT scan performed in January may lack detail of excluding some urologic tumors.  Because of this, he could need to undergo cystoscopy with bilateral retrogrades in the OR to complete the hematuria workup in addition to the  imaging studies - patient would like to undergo the cystoscopy with RTG's in the OR at this time  - I described to the patient how the procedure is performed and the risk associated with the procedure, such as: Infection, bleeding, uncomfortableness for the first few days after the procedure, the possibility of a biopsy of an area of concern in the ureters or bladder and the possibility of a stent placement.  - I also explained the risks of general anesthesia, such as: MI, CVA, paralysis, coma and/or death.  - UA  - Urine culture  - BMP  Return for cystoscopy with RTG's in OR.  These notes generated with voice recognition software. I apologize for typographical errors.  Michiel Cowboy, PA-C  [facility] [address] [place], [place] [address] [phone]

## 2016-05-17 NOTE — Progress Notes (Signed)
Hematology/Oncology Consult note [facility] [phone] [phone]  Patient Care Team: Ellyn Hack, MD as PCP - General (Family Medicine)   Name of the patient: Angel Chase  [id number]  Oct 26, 1965    Reason for referral- anemia   Referring physician- Dr. Brayton El  Date of visit: 05/17/16   History of presenting illness- Patient is a 51 year old male who has a long standing history of hematuria. He has been seen by both nephrology and urology in the past to evaluate his hematuria and states he underwent extensive testing but no cause for hematuria was found. He has persistent hematuria all along sometimes tea colored sometimes bright red but was never anemic in the past. No family h/o bleeding disorders. No gum bleeds, nose bleeds or bleeding in his stool. He has had ankle surgery in the past without any bleeding issues.  He has been referred to [place] for evaluation and management of anemia. Most recent CBC from 04/28/2016 showed white count of 6.8, H&H of 10.9/35.9 with an MCV of 72.5 and a platelet count of 295. Iron studies revealed a low serum iron of 18 and low iron saturation of 4%. TIBC was high normal at 407. Ferritin was low at 6. Patient is yet to see urology for hematuria. He has also been referred to GI for h/o diverticulitis. He has never tried oral iron  ECOG PS- 0  Pain scale- 0   Review of systems- Review of Systems  Constitutional: Negative for chills, fever, malaise/fatigue and weight loss.  HENT: Negative for congestion, ear discharge and nosebleeds.   Eyes: Negative for blurred vision.  Respiratory: Negative for cough, hemoptysis, sputum production, shortness of breath and wheezing.   Cardiovascular: Negative for chest pain, palpitations, orthopnea and claudication.  Gastrointestinal: Negative for abdominal pain, blood in stool, constipation, diarrhea, heartburn, melena, nausea and vomiting.  Genitourinary: Positive  for hematuria. Negative for dysuria, flank pain, frequency and urgency.  Musculoskeletal: Negative for back pain, joint pain and myalgias.  Skin: Negative for rash.  Neurological: Negative for dizziness, tingling, focal weakness, seizures, weakness and headaches.  Endo/Heme/Allergies: Does not bruise/bleed easily.  Psychiatric/Behavioral: Negative for depression and suicidal ideas. The patient does not have insomnia.     No Known Allergies  Patient Active Problem List   Diagnosis Date Noted  . Anemia 04/28/2016  . Hematuria, gross 04/28/2016  . Encounter for annual physical exam 03/28/2016  . Diverticulitis large intestine w/o perforation or abscess w/bleeding 03/04/2016  . Hyperlipidemia 01/27/2015  . Benign essential hematuria 07/10/2014  . Failure of erection 07/10/2014  . Encounter for examination for driving license 62/13/0865  . BP (high blood pressure) 07/10/2014  . Extreme obesity 10/09/2013  . Avitaminosis D 10/09/2013     Past Medical History:  Diagnosis Date  . ED (erectile dysfunction)   . EKG abnormalities   . Hematuria   . Hypertension   . Obesity   . Obstructive sleep apnea   . Vitamin D deficiency      Past Surgical History:  Procedure Laterality Date  . ANKLE SURGERY Left 03/07/2011    Social History   Social History  . Marital status: Married    Spouse name: Sherrie  . Number of children: 1  . 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 Smoking status: Never Smoker  . Smokeless tobacco: Never Used  . Alcohol use No  . Drug use:  No  . Sexual activity: Yes   Other Topics Concern  . Not on file   Social History Narrative  . No narrative on file     Family History  Problem Relation Age of Onset  . Breast cancer Mother   . Hypertension Mother   . Diabetes Father   . Hypertension Father   . Pancreatic disease Sister   . Diabetes Sister   . Sleep apnea Sister       Current Outpatient Prescriptions:  .  tadalafil (CIALIS) 20 MG tablet, Take 1 tablet by mouth daily., Disp: , Rfl:  .  telmisartan-hydrochlorothiazide (MICARDIS HCT) 80-25 MG tablet, Take 1 tablet by mouth daily., Disp: 90 tablet, Rfl: 0   Physical exam:  Vitals:   05/17/16 1346  BP: (!) 156/84  Pulse: 91  Resp: 18  Temp: (!) 95.5 F (35.3 C)  TempSrc: Tympanic  Weight: (!) 305 lb (138.3 kg)  Height: 5' 11.5" (1.816 m)   Physical Exam  Constitutional: He is oriented to person, place, and time and well-developed, well-nourished, and in no distress.  HENT:  Head: Normocephalic and atraumatic.  Eyes: EOM are normal. Pupils are equal, round, and reactive to light.  Neck: Normal range of motion.  Cardiovascular: Normal rate, regular rhythm and normal heart sounds.   Pulmonary/Chest: Effort normal and breath sounds normal.  Abdominal: Soft. Bowel sounds are normal.  Neurological: He is alert and oriented to person, place, and time.  Skin: Skin is warm and dry.       CMP Latest Ref Rng & Units 03/28/2016  Glucose 65 - 99 mg/dL 98  BUN 7 - 25 mg/dL 11  Creatinine 7.82 - 9.56 mg/dL 2.13  Sodium 086 - 578 mmol/L 138  Potassium 3.5 - 5.3 mmol/L 3.9  Chloride 98 - 110 mmol/L 101  CO2 20 - 31 mmol/L 29  Calcium 8.6 - 10.3 mg/dL 9.0  Total Protein 6.1 - 8.1 g/dL 6.8  Total Bilirubin 0.2 - 1.2 mg/dL 0.3  Alkaline Phos 40 - 115 U/L 81  AST 10 - 40 U/L 16  ALT 9 - 46 U/L 16   CBC Latest Ref Rng & Units 04/28/2016  WBC 3.8 - 10.8 K/uL 6.8  Hemoglobin 13.2 - 17.1 g/dL 10.9(L)  Hematocrit 38.5 - 50.0 % 35.9(L)  Platelets 140 - 400 K/uL 295    No images are attached to the encounter.  No results found.  Assessment and plan- Patient is a 51 y.o. male referred to [place] for iron deficiency anemia likely from hematuria  Awaiting urology and GI evaluation. I will repeat his cbc today. If his hemoglobin continues to worsen less than 10, I will consider IV iron. Since he has not  had trial of oral iron, I recommend that he should take 325 mg PO iron sulfate BID and I will repeat his cbc, iron studies, B12, folate and retic count in 1 months time. He will call us if he has trouble tolerating oral iron such as constipation or abdominal pain and I will switch him to IV iron at that time.    Thank you for this kind referral and the opportunity to participate in the care of this patient   Visit Diagnosis 1. Iron deficiency anemia due to chronic blood loss     Dr. Owens Shark, MD, MPH [facility] Pager- [phone] 05/17/2016  2:17 PM

## 2016-05-18 ENCOUNTER — Other Ambulatory Visit: Payer: Self-pay | Admitting: Radiology

## 2016-05-18 ENCOUNTER — Telehealth: Payer: Self-pay | Admitting: Radiology

## 2016-05-18 ENCOUNTER — Encounter: Payer: Self-pay | Admitting: Urology

## 2016-05-18 ENCOUNTER — Ambulatory Visit: Payer: BLUE CROSS/BLUE SHIELD | Admitting: Urology

## 2016-05-18 VITALS — BP 131/83 | HR 93 | Ht 71.0 in | Wt 303.6 lb

## 2016-05-18 DIAGNOSIS — R310 Gross hematuria: Secondary | ICD-10-CM

## 2016-05-18 LAB — URINALYSIS, COMPLETE
Bilirubin, UA: NEGATIVE
GLUCOSE, UA: NEGATIVE
Ketones, UA: NEGATIVE
Leukocytes, UA: NEGATIVE
NITRITE UA: NEGATIVE
PH UA: 6.5 (ref 5.0–7.5)
Specific Gravity, UA: 1.02 (ref 1.005–1.030)
UUROB: 0.2 mg/dL (ref 0.2–1.0)

## 2016-05-18 LAB — MICROSCOPIC EXAMINATION: Epithelial Cells (non renal): NONE SEEN /hpf (ref 0–10)

## 2016-05-18 NOTE — Telephone Encounter (Signed)
Notified pt of surgery scheduled with Dr Budzyn on 06/01/16, pre-admit testing appt & to call day prior to surgery for arrival time to SDS. Questions answered. Pt voices understanding. 

## 2016-05-19 LAB — BASIC METABOLIC PANEL
BUN / CREAT RATIO: 13 (ref 9–20)
BUN: 12 mg/dL (ref 6–24)
CALCIUM: 9.4 mg/dL (ref 8.7–10.2)
CO2: 22 mmol/L (ref 18–29)
Chloride: 97 mmol/L (ref 96–106)
Creatinine, Ser: 0.94 mg/dL (ref 0.76–1.27)
GFR, EST AFRICAN AMERICAN: 110 mL/min/{1.73_m2} (ref >59)
GFR, EST NON AFRICAN AMERICAN: 95 mL/min/{1.73_m2} (ref >59)
Glucose: 95 mg/dL (ref 65–99)
Potassium: 4.3 mmol/L (ref 3.5–5.2)
Sodium: 139 mmol/L (ref 134–144)

## 2016-05-20 ENCOUNTER — Inpatient Hospital Stay: Admission: RE | Admit: 2016-05-20 | Payer: BLUE CROSS/BLUE SHIELD | Source: Ambulatory Visit

## 2016-05-21 LAB — CULTURE, URINE COMPREHENSIVE

## 2016-05-23 ENCOUNTER — Inpatient Hospital Stay: Admission: RE | Admit: 2016-05-23 | Payer: BLUE CROSS/BLUE SHIELD | Source: Ambulatory Visit

## 2016-05-24 ENCOUNTER — Encounter
Admission: RE | Admit: 2016-05-24 | Discharge: 2016-05-24 | Disposition: A | Discharge status: Home / Self Care | Payer: BLUE CROSS/BLUE SHIELD | Source: Ambulatory Visit | Attending: Urology | Admitting: Urology

## 2016-05-24 DIAGNOSIS — I10 Essential (primary) hypertension: Secondary | ICD-10-CM | POA: Diagnosis not present

## 2016-05-24 DIAGNOSIS — Z01810 Encounter for preprocedural cardiovascular examination: Secondary | ICD-10-CM | POA: Insufficient documentation

## 2016-05-24 DIAGNOSIS — R9431 Abnormal electrocardiogram [ECG] [EKG]: Secondary | ICD-10-CM | POA: Diagnosis not present

## 2016-05-24 NOTE — Patient Instructions (Signed)
  Your procedure is scheduled on: Wednesday, Jun 01, 2016 Report to Same Day Surgery 2nd floor medical mall (Medical Mall Entrance-take elevator on left to 2nd floor.  Check in with surgery information desk.) To find out your arrival time please call [phone] between 1PM - 3PM on Tuesday, May 31, 2016  Remember: Instructions that are not followed completely may result in serious medical risk, up to and including death, or upon the discretion of your surgeon and anesthesiologist your surgery may need to be rescheduled.    _x___ 1. Do not eat food or drink liquids after midnight. No gum chewing or hard candies.      __x__ 2. No Alcohol for 24 hours before or after surgery.   __x__3. No Smoking for 24 prior to surgery.   ____  4. Bring all medications with you on the day of surgery if instructed.    __x__ 5. Notify your doctor if there is any change in your medical condition     (cold, fever, infections).     Do not wear jewelry, make-up, hairpins, clips or nail polish.  Do not wear lotions, powders, or perfumes. You may wear deodorant.  Do not shave 48 hours prior to surgery. Men may shave face and neck.  Do not bring valuables to the hospital.    [facility] not responsible for any belongings or valuables.     Patients discharged the day of surgery will not be allowed to drive home.  You will need someone to drive you home and stay with you the night of your procedure.    Please read over the following fact sheets that you were given:   [facility] Preparing for Surgery and or MRSA Information   _x___ Take these medications the morning of surgery with a SIP of water:   1. NONE  X____Stop Anti-inflammatories such as Advil, Aleve, Ibuprofen, Motrin, Naproxen, Naprosyn, Goodies powders or aspirin products. OK to take Tylenol.   _x___ Stop supplements until after surgery.  But may continue Vitamin D, Vitamin B, and multivitamin.  MAY TAKE ALL CURRENT MEDICATIONS LISTED UP  UNTIL THE DAY BEFORE SURGERY.

## 2016-05-30 ENCOUNTER — Encounter: Payer: Self-pay | Admitting: Gastroenterology

## 2016-05-30 ENCOUNTER — Ambulatory Visit (INDEPENDENT_AMBULATORY_CARE_PROVIDER_SITE_OTHER): Payer: BLUE CROSS/BLUE SHIELD | Admitting: Gastroenterology

## 2016-05-30 ENCOUNTER — Telehealth: Payer: Self-pay

## 2016-05-30 ENCOUNTER — Other Ambulatory Visit: Payer: Self-pay

## 2016-05-30 VITALS — BP 126/73 | HR 81 | Temp 98.2°F | Ht 71.0 in | Wt 304.0 lb

## 2016-05-30 DIAGNOSIS — D509 Iron deficiency anemia, unspecified: Secondary | ICD-10-CM

## 2016-05-30 DIAGNOSIS — R9431 Abnormal electrocardiogram [ECG] [EKG]: Secondary | ICD-10-CM | POA: Insufficient documentation

## 2016-05-30 NOTE — Progress Notes (Signed)
Gastroenterology Consultation  Referring Provider:     Ellyn HackShah, Syed Asad A, MD Primary Care Physician:  Ellyn HackShah, Syed Asad A, MD Primary Gastroenterologist:  Dr. Wyline MoodKiran Amee Boothe  Reason for Consultation:     Iron deficiency anemia        HPI:   Angel Chase is a 51 y.o. y/o male referred for consultation & management  by Dr. Sherryll BurgerShah, Melanee SprySyed Asad A, MD.   He has been referred by Dr Smith Robertao in Oncology for GI evaluation for iron deficiency anemia. Recent Hb 05/17/16 Hb 10.4 mcv 68., ferritin 6  , receiving IV iron with hematology. He had an episode of diverticulitis in 01/2016 and doing well since. No family history of colon cancer or polyps. No prior colonoscopy.   He says he is going to have a cystoscopy next week which is scheduled and what by his description sounds like IVP  No rectal bleeding , no nose bleeds, no change in bowel habits. On oral iron   Past Medical History:  Diagnosis Date  . ED (erectile dysfunction)   . EKG abnormalities   . Hematuria   . Hematuria    x 20 yr per pt  . Hypertension   . Obesity   . Obstructive sleep apnea   . Vitamin D deficiency     Past Surgical History:  Procedure Laterality Date  . ANKLE SURGERY Left 03/07/2011   fracture from trauma  . TONSILLECTOMY    . TONSILLECTOMY AND ADENOIDECTOMY  age 31    Prior to Admission medications   Medication Sig Start Date End Date Taking? Authorizing Provider  ferrous sulfate 325 (65 FE) MG EC tablet Take 325 mg by mouth 2 (two) times daily with a meal. (0600 & 1530)    [provider]  Multiple Vitamin (MULTIVITAMIN WITH MINERALS) TABS tablet Take 1 tablet by mouth daily at 6 (six) AM.    [provider]  tadalafil (CIALIS) 20 MG tablet Take 20 mg by mouth daily as needed for erectile dysfunction.     [provider]  telmisartan-hydrochlorothiazide (MICARDIS HCT) 80-25 MG tablet Take 1 tablet by mouth daily. Patient taking differently: Take 1 tablet by mouth daily at 6 (six) AM.  03/04/16    Ellyn HackShah, Syed Asad A, MD    Family History  Problem Relation Age of Onset  . Breast cancer Mother   . Hypertension Mother   . Diabetes Father   . Hypertension Father   . Hypertension Sister   . Pancreatic disease Sister   . Diabetes Sister   . Hypertension Sister   . Hypertension Brother   . Diverticulosis Brother   . Prostate cancer Maternal Uncle   . Prostate cancer Maternal Uncle   . Kidney cancer Neg Hx   . Bladder Cancer Neg Hx      Social History  Substance Use Topics  . Smoking status: Never Smoker  . Smokeless tobacco: Never Used  . Alcohol use No    Allergies as of 05/30/2016  . (No Known Allergies)    Review of Systems:    All systems reviewed and negative except where noted in HPI.   Physical Exam:  There were no vitals taken for this visit. No LMP for male patient. Psych:  Alert and cooperative. Normal mood and affect. General:   Alert,  Well-developed, well-nourished, pleasant and cooperative in NAD Head:  Normocephalic and atraumatic. Eyes:  Sclera clear, no icterus.   Conjunctiva pink. Ears:  Normal auditory acuity. Nose:  No deformity, discharge, or lesions. Mouth:  No deformity or lesions,oropharynx pink & moist. Neck:  Supple; no masses or thyromegaly. Lungs:  Respirations even and unlabored.  Clear throughout to auscultation.   No wheezes, crackles, or rhonchi. No acute distress. Heart:  Regular rate and rhythm; no murmurs, clicks, rubs, or gallops. Abdomen:  Normal bowel sounds.  No bruits.  Soft, non-tender and non-distended without masses, hepatosplenomegaly or hernias noted.  No guarding or rebound tenderness.    Pulses:  Normal pulses noted. Extremities:  No clubbing or edema.  No cyanosis. Neurologic:  Alert and oriented x3;  grossly normal neurologically. Psych:  Alert and cooperative. Normal mood and affect.  Imaging Studies: No results found.  Assessment and Plan:   Angel Chase is a 51 y.o. y/o male has been referred for  iron  deficienay anemia, He has been evaluated for hematuria but has not had a GI work up. He had an episode of acute diverticulitis back in 01/2016   Plan  1. Check celiac serology  2 . EGD+ colonoscopy +/- small bowel capsule study   I have discussed alternative options, risks & benefits,  which include, but are not limited to, bleeding, infection, perforation,respiratory complication & drug reaction.  The patient agrees with this plan & written consent will be obtained.     F/u in 8 weeks  Dr Wyline Mood MD

## 2016-05-30 NOTE — Telephone Encounter (Signed)
Gastroenterology Pre-Procedure Review  Request Date: 6/15 Requesting Physician: Dr. Tobi BastosANNA  PATIENT REVIEW QUESTIONS: The patient responded to the following health history questions as indicated:    1. Are you having any GI issues? yes (IRON DEFICIENCY ANEMIA) 2. Do you have a personal history of Polyps? no 3. Do you have a family history of Colon Cancer or Polyps? no 4. Diabetes Mellitus? no 5. Joint replacements in the past 12 months?no 6. Major health problems in the past 3 months?yes (DIVERTICULITIS) 7. Any artificial heart valves, MVP, or defibrillator?no    MEDICATIONS & ALLERGIES:    Patient reports the following regarding taking any anticoagulation/antiplatelet therapy:   Plavix, Coumadin, Eliquis, Xarelto, Lovenox, Pradaxa, Brilinta, or Effient? no Aspirin? no  Patient confirms/reports the following medications:  Current Outpatient Prescriptions  Medication Sig Dispense Refill  . ferrous sulfate 325 (65 FE) MG EC tablet Take 325 mg by mouth 2 (two) times daily with a meal. (0600 & 1530)    . Multiple Vitamin (MULTIVITAMIN WITH MINERALS) TABS tablet Take 1 tablet by mouth daily at 6 (six) AM.    . ranitidine (ZANTAC) 75 MG tablet Take 75 mg by mouth 2 (two) times daily.    . tadalafil (CIALIS) 20 MG tablet Take 20 mg by mouth daily as needed for erectile dysfunction.     Marland Kitchen. telmisartan-hydrochlorothiazide (MICARDIS HCT) 80-25 MG tablet Take 1 tablet by mouth daily. (Patient taking differently: Take 1 tablet by mouth daily at 6 (six) AM. ) 90 tablet 0   No current facility-administered medications for this visit.     Patient confirms/reports the following allergies:  No Known Allergies  No orders of the defined types were placed in this encounter.   AUTHORIZATION INFORMATION Primary Insurance: 1D#: Group #:  Secondary Insurance: 1D#: Group #:  SCHEDULE INFORMATION: Date: 6/15  Time: Location: [facility]

## 2016-05-31 MED ORDER — CEFAZOLIN SODIUM-DEXTROSE 2-4 GM/100ML-% IV SOLN
2.0000 g | INTRAVENOUS | Status: AC
  Administered 2016-06-01: 2 g via INTRAVENOUS

## 2016-06-01 ENCOUNTER — Encounter
Admission: RE | Disposition: A | Discharge status: Home / Self Care | Payer: Self-pay | Source: Ambulatory Visit | Attending: Urology

## 2016-06-01 ENCOUNTER — Ambulatory Visit: Payer: BLUE CROSS/BLUE SHIELD | Admitting: Anesthesiology

## 2016-06-01 ENCOUNTER — Telehealth: Payer: Self-pay | Admitting: Gastroenterology

## 2016-06-01 ENCOUNTER — Other Ambulatory Visit: Payer: Self-pay | Admitting: Family Medicine

## 2016-06-01 ENCOUNTER — Ambulatory Visit
Admission: RE | Admit: 2016-06-01 | Discharge: 2016-06-01 | Disposition: A | Discharge status: Home / Self Care | Payer: BLUE CROSS/BLUE SHIELD | Source: Ambulatory Visit | Attending: Urology | Admitting: Urology

## 2016-06-01 ENCOUNTER — Telehealth: Payer: Self-pay | Admitting: Urology

## 2016-06-01 ENCOUNTER — Encounter: Payer: Self-pay | Admitting: *Deleted

## 2016-06-01 DIAGNOSIS — K5732 Diverticulitis of large intestine without perforation or abscess without bleeding: Secondary | ICD-10-CM | POA: Diagnosis not present

## 2016-06-01 DIAGNOSIS — E669 Obesity, unspecified: Secondary | ICD-10-CM | POA: Diagnosis not present

## 2016-06-01 DIAGNOSIS — K219 Gastro-esophageal reflux disease without esophagitis: Secondary | ICD-10-CM | POA: Diagnosis not present

## 2016-06-01 DIAGNOSIS — G4733 Obstructive sleep apnea (adult) (pediatric): Secondary | ICD-10-CM | POA: Diagnosis not present

## 2016-06-01 DIAGNOSIS — R310 Gross hematuria: Secondary | ICD-10-CM | POA: Diagnosis not present

## 2016-06-01 DIAGNOSIS — I10 Essential (primary) hypertension: Secondary | ICD-10-CM | POA: Insufficient documentation

## 2016-06-01 DIAGNOSIS — N401 Enlarged prostate with lower urinary tract symptoms: Secondary | ICD-10-CM | POA: Diagnosis not present

## 2016-06-01 DIAGNOSIS — N201 Calculus of ureter: Secondary | ICD-10-CM | POA: Insufficient documentation

## 2016-06-01 HISTORY — PX: STONE EXTRACTION WITH BASKET: SHX5318

## 2016-06-01 HISTORY — PX: URETEROSCOPY: SHX842

## 2016-06-01 HISTORY — PX: CYSTOSCOPY WITH STENT PLACEMENT: SHX5790

## 2016-06-01 HISTORY — PX: CYSTOSCOPY W/ RETROGRADES: SHX1426

## 2016-06-01 SURGERY — CYSTOSCOPY, WITH RETROGRADE PYELOGRAM
Anesthesia: General | Site: Ureter

## 2016-06-01 MED ORDER — PROPOFOL 10 MG/ML IV BOLUS
INTRAVENOUS | Status: AC
  Filled 2016-06-01: qty 40

## 2016-06-01 MED ORDER — FENTANYL CITRATE (PF) 100 MCG/2ML IJ SOLN
INTRAMUSCULAR | Status: AC
  Filled 2016-06-01: qty 2

## 2016-06-01 MED ORDER — LACTATED RINGERS IV SOLN
INTRAVENOUS | Status: DC
  Administered 2016-06-01: 06:00:00 via INTRAVENOUS

## 2016-06-01 MED ORDER — FAMOTIDINE 20 MG PO TABS
20.0000 mg | ORAL_TABLET | Freq: Once | ORAL | Status: AC
  Administered 2016-06-01: 20 mg via ORAL

## 2016-06-01 MED ORDER — DEXAMETHASONE SODIUM PHOSPHATE 10 MG/ML IJ SOLN
INTRAMUSCULAR | Status: AC
  Filled 2016-06-01: qty 1

## 2016-06-01 MED ORDER — LIDOCAINE HCL (CARDIAC) 20 MG/ML IV SOLN
INTRAVENOUS | Status: DC | PRN
  Administered 2016-06-01: 100 mg via INTRAVENOUS

## 2016-06-01 MED ORDER — ONDANSETRON HCL 4 MG/2ML IJ SOLN
INTRAMUSCULAR | Status: DC | PRN
  Administered 2016-06-01: 4 mg via INTRAVENOUS

## 2016-06-01 MED ORDER — ONDANSETRON HCL 4 MG/2ML IJ SOLN
INTRAMUSCULAR | Status: AC
  Filled 2016-06-01: qty 2

## 2016-06-01 MED ORDER — PHENYLEPHRINE HCL 10 MG/ML IJ SOLN
INTRAMUSCULAR | Status: AC
  Filled 2016-06-01: qty 1

## 2016-06-01 MED ORDER — MIDAZOLAM HCL 2 MG/2ML IJ SOLN
INTRAMUSCULAR | Status: AC
  Filled 2016-06-01: qty 2

## 2016-06-01 MED ORDER — MIDAZOLAM HCL 2 MG/2ML IJ SOLN
INTRAMUSCULAR | Status: DC | PRN
  Administered 2016-06-01: 2 mg via INTRAVENOUS

## 2016-06-01 MED ORDER — IOTHALAMATE MEGLUMINE 43 % IV SOLN
INTRAVENOUS | Status: DC | PRN
  Administered 2016-06-01: 40 mL via URETHRAL

## 2016-06-01 MED ORDER — CEPHALEXIN 500 MG PO CAPS: 500.0000 mg | ORAL_CAPSULE | Freq: Three times a day (TID) | ORAL | 0 refills | Status: DC

## 2016-06-01 MED ORDER — FENTANYL CITRATE (PF) 100 MCG/2ML IJ SOLN
INTRAMUSCULAR | Status: DC | PRN
  Administered 2016-06-01 (×2): 50 ug via INTRAVENOUS

## 2016-06-01 MED ORDER — PHENYLEPHRINE HCL 10 MG/ML IJ SOLN
INTRAMUSCULAR | Status: DC | PRN
  Administered 2016-06-01: 100 ug via INTRAVENOUS

## 2016-06-01 MED ORDER — CEFAZOLIN SODIUM-DEXTROSE 2-4 GM/100ML-% IV SOLN
INTRAVENOUS | Status: AC
  Filled 2016-06-01: qty 100

## 2016-06-01 MED ORDER — ONDANSETRON HCL 4 MG/2ML IJ SOLN: 4.0000 mg | Freq: Once | INTRAMUSCULAR | Status: DC | PRN

## 2016-06-01 MED ORDER — PROPOFOL 10 MG/ML IV BOLUS
INTRAVENOUS | Status: DC | PRN
  Administered 2016-06-01: 230 mg via INTRAVENOUS

## 2016-06-01 MED ORDER — DEXAMETHASONE SODIUM PHOSPHATE 10 MG/ML IJ SOLN
INTRAMUSCULAR | Status: DC | PRN
  Administered 2016-06-01: 10 mg via INTRAVENOUS

## 2016-06-01 MED ORDER — HYDROCODONE-ACETAMINOPHEN 5-325 MG PO TABS
1.0000 | ORAL_TABLET | ORAL | 0 refills | Status: DC | PRN
Start: 2016-06-01 — End: 2016-07-01

## 2016-06-01 MED ORDER — EPHEDRINE SULFATE 50 MG/ML IJ SOLN
INTRAMUSCULAR | Status: AC
  Filled 2016-06-01: qty 1

## 2016-06-01 MED ORDER — FAMOTIDINE 20 MG PO TABS
ORAL_TABLET | ORAL | Status: AC
  Filled 2016-06-01: qty 1

## 2016-06-01 MED ORDER — GLYCOPYRROLATE 0.2 MG/ML IJ SOLN
INTRAMUSCULAR | Status: DC | PRN
  Administered 2016-06-01: 0.2 mg via INTRAVENOUS

## 2016-06-01 MED ORDER — LIDOCAINE HCL (PF) 2 % IJ SOLN
INTRAMUSCULAR | Status: AC
  Filled 2016-06-01: qty 2

## 2016-06-01 MED ORDER — FENTANYL CITRATE (PF) 100 MCG/2ML IJ SOLN
25.0000 ug | INTRAMUSCULAR | Status: DC | PRN
Start: 2016-06-01 — End: 2016-06-01

## 2016-06-01 MED ORDER — GLYCOPYRROLATE 0.2 MG/ML IJ SOLN
INTRAMUSCULAR | Status: AC
  Filled 2016-06-01: qty 2

## 2016-06-01 MED ORDER — SUCCINYLCHOLINE CHLORIDE 20 MG/ML IJ SOLN
INTRAMUSCULAR | Status: AC
  Filled 2016-06-01: qty 1

## 2016-06-01 SURGICAL SUPPLY — 28 items
BACTOSHIELD CHG 4% 4OZ (MISCELLANEOUS) ×1
BASKET ZERO TIP 1.9FR (BASKET) ×4 IMPLANT
CATH URETL 5X70 OPEN END (CATHETERS) ×4 IMPLANT
CONRAY 43 FOR UROLOGY 50M (MISCELLANEOUS) ×8 IMPLANT
FORCEPS BIOP PIRANHA Y (CUTTING FORCEPS) IMPLANT
GLOVE BIO SURGEON STRL SZ7 (GLOVE) ×8 IMPLANT
GLOVE BIO SURGEON STRL SZ7.5 (GLOVE) ×4 IMPLANT
GOWN L4 LG 24 PK N/S (GOWN DISPOSABLE) ×4 IMPLANT
GOWN STRL REUS W/ TWL LRG LVL3 (GOWN DISPOSABLE) ×3 IMPLANT
GOWN STRL REUS W/ TWL LRG LVL4 (GOWN DISPOSABLE) ×3 IMPLANT
GOWN STRL REUS W/ TWL XL LVL3 (GOWN DISPOSABLE) ×3 IMPLANT
GOWN STRL REUS W/TWL LRG LVL3 (GOWN DISPOSABLE) ×1
GOWN STRL REUS W/TWL LRG LVL4 (GOWN DISPOSABLE) ×1
GOWN STRL REUS W/TWL XL LVL3 (GOWN DISPOSABLE) ×5 IMPLANT
KIT BALLN UROMAX 15FX4 (MISCELLANEOUS) ×3 IMPLANT
KIT BALLN UROMAX 26 75X4 (MISCELLANEOUS) ×1
KIT RM TURNOVER CYSTO AR (KITS) ×4 IMPLANT
PACK CYSTO AR (MISCELLANEOUS) ×4 IMPLANT
SCRUB CHG 4% DYNA-HEX 4OZ (MISCELLANEOUS) ×3 IMPLANT
SENSORWIRE 0.038 NOT ANGLED (WIRE) ×8
SET CYSTO W/LG BORE CLAMP LF (SET/KITS/TRAYS/PACK) ×4 IMPLANT
SOL .9 NS 3000ML IRR  AL (IV SOLUTION) ×1
SOL .9 NS 3000ML IRR UROMATIC (IV SOLUTION) ×3 IMPLANT
STENT URET 6FRX24 CONTOUR (STENTS) IMPLANT
STENT URET 6FRX26 CONTOUR (STENTS) ×4 IMPLANT
SURGILUBE 2OZ TUBE FLIPTOP (MISCELLANEOUS) ×4 IMPLANT
WATER STERILE IRR 1000ML POUR (IV SOLUTION) ×4 IMPLANT
WIRE SENSOR 0.038 NOT ANGLED (WIRE) ×6 IMPLANT

## 2016-06-01 NOTE — Transfer of Care (Signed)
Immediate Anesthesia Transfer of Care Note  Patient: Agnes LawrenceDaryl E Mozingo  Procedure(s) Performed: Procedure(s): CYSTOSCOPY WITH RETROGRADE PYELOGRAM (Bilateral) CYSTOSCOPY WITH STENT PLACEMENT (Left) URETEROSCOPY (Left) URETERAL DILITATION (Left) STONE EXTRACTION WITH BASKET (Left)  Patient Location: PACU  Anesthesia Type:General  Level of Consciousness: sedated  Airway & Oxygen Therapy: Patient Spontanous Breathing and Patient connected to face mask oxygen  Post-op Assessment: Report given to RN and Post -op Vital signs reviewed and stable  Post vital signs: Reviewed and stable  Last Vitals:  Vitals:   06/01/16 0601 06/01/16 0840  BP: (!) 142/84 (!) 146/80  Pulse: 80 (!) 105  Resp: 14 19  Temp: 36.4 C 36.5 C    Complications: No apparent anesthesia complications

## 2016-06-01 NOTE — Anesthesia Preprocedure Evaluation (Signed)
Anesthesia Evaluation  Patient identified by MRN, date of birth, ID band Patient awake    Reviewed: Allergy & Precautions, NPO status , Patient's Chart, lab work & pertinent test results  History of Anesthesia Complications Negative for: history of anesthetic complications  Airway Mallampati: III       Dental   Pulmonary sleep apnea (no CPAP) ,           Cardiovascular hypertension, Pt. on medications      Neuro/Psych negative neurological ROS     GI/Hepatic Neg liver ROS, GERD  Medicated and Controlled,  Endo/Other  negative endocrine ROS  Renal/GU negative Renal ROS     Musculoskeletal   Abdominal   Peds  Hematology  (+) anemia ,   Anesthesia Other Findings   Reproductive/Obstetrics                             Anesthesia Physical Anesthesia Plan  ASA: II  Anesthesia Plan: General   Post-op Pain Management:    Induction: Intravenous  Airway Management Planned: LMA  Additional Equipment:   Intra-op Plan:   Post-operative Plan:   Informed Consent: I have reviewed the patients History and Physical, chart, labs and discussed the procedure including the risks, benefits and alternatives for the proposed anesthesia with the patient or authorized representative who has indicated his/her understanding and acceptance.     Plan Discussed with:   Anesthesia Plan Comments:         Anesthesia Quick Evaluation

## 2016-06-01 NOTE — H&P (View-Only) (Signed)
  05/18/2016 10:27 AM   Angel Chase 04/10/1965 [id number]  Referring provider: Syed Asad A Shah, MD [address] , [place] [address]  Chief Complaint  Patient presents with  . New Patient (Initial Visit)    gross hematuria referred by Dr. Syed Shah    HPI: Patient is a 51 -year-old African American male who presents today as a referral from their PCP, Dr. Shah, for gross hematuria.    Patient has a long history of gross hematuria with clots x 20 years.  So far, hematuria workups have not discovered any etiology.  He ha been evaluated by Dr. Stoioff, [facility] and nephrology.  He has had kidney biopsies, CTU, renal angiograms and cystoscopies.    He does not have a prior history of recurrent urinary tract infections, nephrolithiasis, trauma to the genitourinary tract, BPH or malignancies of the genitourinary tract.   He does not have a family medical history of nephrolithiasis, malignancies of the genitourinary tract or hematuria.     Today, he is not having symptoms of frequent urination, urgency, dysuria, nocturia, incontinence, hesitancy, intermittency, straining to urinate or a weak urinary stream.  His UA today demonstrates > 30 RBC's and many bacteria.    He is not experiencing any suprapubic pain, abdominal pain or flank pain. He denies any recent fevers, chills, nausea or vomiting.   Contrast CT performed on 02/05/2016 for LLQ pain noted kidneys are orthotopic, demonstrating symmetric enhancement. No nephrolithiasis, hydronephrosis or solid renal masses. 5.8 cm RIGHT lower pole benign-appearing cysts. 16 mm LEFT interpolar cyst. The unopacified ureters are normal in course and caliber. Delayed imaging through the kidneys demonstrates symmetric prompt contrast excretion within the proximal urinary collecting system. Urinary bladder is partially distended and unremarkable. Normal adrenal glands.  Acute sigmoid diverticulitis without complication.  I have  independently reviewed the films.    He is not a smoker.  He was exposed to secondhand smoke as a child.  He has worked with pesticides and fertilizers.  He has HTN.   He has a high BMI.    His current PSA was 0.7 ng/mL on 03/28/2016.     PMH: Past Medical History:  Diagnosis Date  . ED (erectile dysfunction)   . EKG abnormalities   . Hematuria   . Hematuria    x 20 yr per pt  . Hypertension   . Obesity   . Obstructive sleep apnea   . Vitamin D deficiency     Surgical History: Past Surgical History:  Procedure Laterality Date  . ANKLE SURGERY Left 03/07/2011    Home Medications:  Allergies as of 05/18/2016   No Known Allergies     Medication List       Accurate as of 05/18/16 10:27 AM. Always use your most recent med list.          CIALIS 20 MG tablet Generic drug:  tadalafil Take 1 tablet by mouth daily.   ferrous sulfate 325 (65 FE) MG EC tablet Take 325 mg by mouth 2 (two) times daily with a meal.   MULTI-VITAMIN DAILY PO Take by mouth.   telmisartan-hydrochlorothiazide 80-25 MG tablet Commonly known as:  MICARDIS HCT Take 1 tablet by mouth daily.       Allergies: No Known Allergies  Family History: Family History  Problem Relation Age of Onset  . Breast cancer Mother   . Hypertension Mother   . Diabetes Father   . Hypertension Father   . Hypertension Sister   .   Pancreatic disease Sister   . Diabetes Sister   . Hypertension Sister   . Hypertension Brother   . Diverticulosis Brother   . Prostate cancer Maternal Uncle   . Prostate cancer Maternal Uncle   . Kidney cancer Neg Hx   . Bladder Cancer Neg Hx     Social History:  reports that he has never smoked. He has never used smokeless tobacco. He reports that he does not drink alcohol or use drugs.  ROS: UROLOGY Frequent Urination?: No Hard to postpone urination?: No Burning/pain with urination?: No Get up at night to urinate?: No Leakage of urine?: No Urine stream starts and stops?:  No Trouble starting stream?: No Do you have to strain to urinate?: No Blood in urine?: Yes Urinary tract infection?: No Sexually transmitted disease?: No Injury to kidneys or bladder?: No Painful intercourse?: No Weak stream?: No Erection problems?: No Penile pain?: No  Gastrointestinal Nausea?: No Vomiting?: No Indigestion/heartburn?: No Diarrhea?: No Constipation?: No  Constitutional Fever: No Night sweats?: No Weight loss?: No Fatigue?: No  Skin Skin rash/lesions?: No Itching?: No  Eyes Blurred vision?: No Double vision?: No  Ears/Nose/Throat Sore throat?: No Sinus problems?: No  Hematologic/Lymphatic Swollen glands?: No Easy bruising?: No  Cardiovascular Leg swelling?: No Chest pain?: No  Respiratory Cough?: No Shortness of breath?: No  Endocrine Excessive thirst?: No  Musculoskeletal Back pain?: No Joint pain?: No  Neurological Headaches?: No Dizziness?: No  Psychologic Depression?: No Anxiety?: No  Physical Exam: BP 131/83   Pulse 93   Ht 5\' 11"  (1.803 m)   Wt (!) 303 lb 9.6 oz (137.7 kg)   BMI 42.34 kg/m   Constitutional: Well nourished. Alert and oriented, No acute distress. HEENT: [place] AT, moist mucus membranes. Trachea midline, no masses. Cardiovascular: No clubbing, cyanosis, or edema. Respiratory: Normal respiratory effort, no increased work of breathing. GI: Abdomen is soft, non tender, non distended, no abdominal masses. Liver and spleen not palpable.  No hernias appreciated.  Stool sample for occult testing is not indicated.   GU: No CVA tenderness.  No bladder fullness or masses.  Patient with circumcised phallus.   Urethral meatus is patent.  No penile discharge. No penile lesions or rashes. Scrotum without lesions, cysts, rashes and/or edema.  Testicles are located scrotally bilaterally. No masses are appreciated in the testicles. Left and right epididymis are normal. Rectal: Patient with  normal sphincter tone. Anus and  perineum without scarring or rashes. No rectal masses are appreciated. Prostate is approximately 55 grams, no nodules are appreciated. Seminal vesicles are normal. Skin: No rashes, bruises or suspicious lesions. Lymph: No cervical or inguinal adenopathy. Neurologic: Grossly intact, no focal deficits, moving all 4 extremities. Psychiatric: Normal mood and affect.  Laboratory Data: Lab Results  Component Value Date   WBC 6.8 05/17/2016   HGB 10.4 (L) 05/17/2016   HCT 32.6 (L) 05/17/2016   MCV 68.8 (L) 05/17/2016   PLT 306 05/17/2016    Lab Results  Component Value Date   CREATININE 1.02 03/28/2016    Lab Results  Component Value Date   PSA 0.7 03/28/2016    Lab Results  Component Value Date   TSH 0.92 03/28/2016       Component Value Date/Time   CHOL 174 03/28/2016 0950   CHOL 189 01/27/2015 1205   HDL 48 03/28/2016 0950   HDL 50 01/27/2015 1205   CHOLHDL 3.6 03/28/2016 0950   VLDL 12 03/28/2016 0950   LDLCALC 114 (H) 03/28/2016 0950  LDLCALC 124 (H) 01/27/2015 1205    Lab Results  Component Value Date   AST 16 03/28/2016   Lab Results  Component Value Date   ALT 16 03/28/2016     Urinalysis > 30 RBC's.  Many bacteria.  See EPIC  Pertinent Imaging: CLINICAL DATA:  LEFT lower quadrant pain for 2 days.  EXAM: CT ABDOMEN AND PELVIS WITH CONTRAST  TECHNIQUE: Multidetector CT imaging of the abdomen and pelvis was performed using the standard protocol following bolus administration of intravenous contrast.  CONTRAST:  125 cc Isovue 370  COMPARISON:  Abdominal ultrasound September 27, 2013  FINDINGS: LOWER CHEST: Lung bases are clear. Included heart size is normal. No pericardial effusion.  HEPATOBILIARY: Liver and gallbladder are normal.  PANCREAS: Normal.  SPLEEN: Normal.  ADRENALS/URINARY TRACT: Kidneys are orthotopic, demonstrating symmetric enhancement. No nephrolithiasis, hydronephrosis or solid renal masses. 5.8 cm RIGHT lower  pole benign-appearing cysts. 16 mm LEFT interpolar cyst. The unopacified ureters are normal in course and caliber. Delayed imaging through the kidneys demonstrates symmetric prompt contrast excretion within the proximal urinary collecting system. Urinary bladder is partially distended and unremarkable. Normal adrenal glands.  STOMACH/BOWEL: Mild sigmoid diverticulosis with superimposed short segment of circumferential wall thickening, extensive pericolonic fat stranding. The stomach, small bowel are normal in course and caliber without inflammatory changes. Normal appendix.  VASCULAR/LYMPHATIC: Aortoiliac vessels are normal in course and caliber. No lymphadenopathy by CT size criteria.  REPRODUCTIVE: Normal.  OTHER: No intraperitoneal free fluid or free air.  MUSCULOSKELETAL: Nonacute. Small fat containing umbilical hernia. Moderate to severe lower lumbar facet arthropathy. Severe L5-S1 neural foraminal narrowing.  IMPRESSION: Acute sigmoid diverticulitis without complication.  These results will be called to the ordering clinician or representative by the Radiologist Assistant, and communication documented in the [facility] Dashboard   Electronically Signed   By: Awilda Metro M.D.   On: 02/05/2016 19:42   Assessment & Plan:    1. Gross hematuria  - I explained to the patient that there are a number of causes that can be associated with blood in the urine, such as stones,  BPH, UTI's, damage to the urinary tract and/or cancer.  - At this time, I felt that the patient warranted further urologic evaluation.   The AUA guidelines state that a CT urogram is the preferred imaging study to evaluate hematuria.  - I explained to the patient that the CT scan performed in January may lack detail of excluding some urologic tumors.  Because of this, he could need to undergo cystoscopy with bilateral retrogrades in the OR to complete the hematuria workup in addition to the  imaging studies - patient would like to undergo the cystoscopy with RTG's in the OR at this time  - I described to the patient how the procedure is performed and the risk associated with the procedure, such as: Infection, bleeding, uncomfortableness for the first few days after the procedure, the possibility of a biopsy of an area of concern in the ureters or bladder and the possibility of a stent placement.  - I also explained the risks of general anesthesia, such as: MI, CVA, paralysis, coma and/or death.  - UA  - Urine culture  - BMP  Return for cystoscopy with RTG's in OR.  These notes generated with voice recognition software. I apologize for typographical errors.  Michiel Cowboy, PA-C  [facility] [address] [place], [place] [address] [phone]

## 2016-06-01 NOTE — Anesthesia Procedure Notes (Signed)
Procedure Name: LMA Insertion Date/Time: 06/01/2016 7:44 AM Performed by: Stormy FabianURTIS, Jaeda Bruso Pre-anesthesia Checklist: Patient identified, Patient being monitored, Timeout performed, Emergency Drugs available and Suction available Patient Re-evaluated:Patient Re-evaluated prior to inductionOxygen Delivery Method: Circle system utilized Preoxygenation: Pre-oxygenation with 100% oxygen Intubation Type: IV induction Ventilation: Mask ventilation without difficulty LMA: LMA inserted LMA Size: 4.5 Tube type: Oral Number of attempts: 1 Placement Confirmation: positive ETCO2 and breath sounds checked- equal and bilateral Tube secured with: Tape Dental Injury: Teeth and Oropharynx as per pre-operative assessment

## 2016-06-01 NOTE — Telephone Encounter (Signed)
06/01/16 [facility] prior Berkley Harveyauth is required for EGD 43235 & Colonoscopy [id number] (D50.9)

## 2016-06-01 NOTE — Op Note (Signed)
Date of procedure: 06/01/16  Preoperative diagnosis:  1. Gross hematuria   Postoperative diagnosis:  1. Gross hematuria 2. Left nonobstructing stone   Procedure: 1. Cystoscopy 2. Bilateral retrograde pyelogram with interpretation 3. Left ureteral orifices balloon dilation 4. Left ureteroscopy 5. Stone basketing 6. Left ureteral stent placement 6 French by 26 cm  Surgeon: Baruch Gouty, MD  Anesthesia: General  Complications: None  Intraoperative findings: Patient had an unremarkable cystoscopy and bilateral retrograde pyelograms. However, there was clear gross hematuria effluxing from the left ureteral orifice so ureteroscopy on that side was negative except for a 3 mm calculus which was removed intact.  EBL: None  Specimens: Left renal stone to lab  Drains: 6 French by 26 cm left double-J ureteral stent  Disposition: Stable to the postanesthesia care unit  Indication for procedure: The patient is a 51 y.o. male with history of gross hematuria for the last 20 years with negative urological nephrological workup who presents today with new recurrence of gross hematuria. He had a CT scan with contrast but no delays performed. Since today for cystoscopy and bilateral retrograde pyelogram.  After reviewing the management options for treatment, the patient elected to proceed with the above surgical procedure(s). We have discussed the potential benefits and risks of the procedure, side effects of the proposed treatment, the likelihood of the patient achieving the goals of the procedure, and any potential problems that might occur during the procedure or recuperation. Informed consent has been obtained.  Description of procedure: The patient was met in the preoperative area. All risks, benefits, and indications of the procedure were described in great detail. The patient consented to the procedure. Preoperative antibiotics were given. The patient was taken to the operative theater.  General anesthesia was induced per the anesthesia service. The patient was then placed in the dorsal lithotomy position and prepped and draped in the usual sterile fashion. A preoperative timeout was called.   A 21 French 30 cystoscope was inserted in the patient's bladder per urethra atraumatically. Pan cystoscopy was unremarkable. Upon examination of the left ureteral orifice there was clear reflux of gross hematuria. He had clear yellow urine effluxing from his right ureteral orifice. Bilateral retrograde polygrams were obtained and were unremarkable with no filling defects and normal contrast excretion. Decision was made to perform left ureteroscopy due to the gross reflux of hematuria. 2 sensor wires were advanced to level of the renal pelvis under fluoroscopy. When the sensor wires a flexible ureteroscope was attempted to be advanced however this was not possible. The left ear orifice was too narrow to accommodate the scope. Using a 15 French balloon dilator, ureter orifice was dilated under fluoroscopy for 30 seconds. The digital flexible ureteroscope was unable to be advanced past the ureteral orifice and easily up to level of the renal pelvis. With the aid of a retrograde powder, pan nephroscopy was performed which is unremarkable except for an approximately 3 mm stone in the upper pole. After complete eburnation of the kidney, this was removed. Pan ureteroscopy at this point as well as also unremarkable. His overall study was negative except for that gross reflux of hematuria from the left ureteral orifice and a small 3 mm stone which was removed. At this point over the remaining sensor wire 6 French by 26 cm double-J ureteral stent was placed. The sensor was removed. A curl seen in the renal pelvis under fluoroscopy and the urinary bladder under direct visualization. There is drainage of clear light pink urine at  this point. The patient's bladder was drained was woken from anesthesia and transferred in  stable condition to postanesthesia care unit.  Plan: The patient will follow-up to have his stent removed in the office approximately one week. He will need a renal ultrasound in 1 month after that for a iatrogenic hydronephrosis. We'll send his stone for analysis though this is his first stone and he is never spontaneously passed one. At this time there is no clear etiology for his gross hematuria as it is unlikely the stone would cause such significant bleeding. Since ureteroscopy and nephroscopy was negative on the side with gross hematuria, it is likely that the hematuria is nephrologic in origin.  Baruch Gouty, M.D.

## 2016-06-01 NOTE — Interval H&P Note (Signed)
History and Physical Interval Note:  06/01/2016 7:29 AM  Angel Chase  has presented today for surgery, with the diagnosis of GROSS HEMATURIA  The various methods of treatment have been discussed with the patient and family. After consideration of risks, benefits and other options for treatment, the patient has consented to  Procedure(s): CYSTOSCOPY WITH RETROGRADE PYELOGRAM (Bilateral) CYSTOSCOPY WITH BIOPSY (N/A) CYSTOSCOPY WITH STENT PLACEMENT (Bilateral) as a surgical intervention .  The patient's history has been reviewed, patient examined, no change in status, stable for surgery.  I have reviewed the patient's chart and labs.  Questions were answered to the patient's satisfaction.    RRR Lungs clear  Hildred LaserBrian James Millicent Blazejewski

## 2016-06-01 NOTE — Telephone Encounter (Signed)
-----   Message from Hildred LaserBrian James Budzyn, MD sent at 06/01/2016  8:47 AM EDT ----- Patient needs to see me next week for cysto/stent removal. thanks

## 2016-06-01 NOTE — Telephone Encounter (Signed)
done

## 2016-06-01 NOTE — Discharge Instructions (Signed)
AMBULATORY SURGERY  °DISCHARGE INSTRUCTIONS ° ° °1) The drugs that you were given will stay in your system until tomorrow so for the next 24 hours you should not: ° °A) Drive an automobile °B) Make any legal decisions °C) Drink any alcoholic beverage ° ° °2) You may resume regular meals tomorrow.  Today it is better to start with liquids and gradually work up to solid foods. ° °You may eat anything you prefer, but it is better to start with liquids, then soup and crackers, and gradually work up to solid foods. ° ° °3) Please notify your doctor immediately if you have any unusual bleeding, trouble breathing, redness and pain at the surgery site, drainage, fever, or pain not relieved by medication. ° ° ° °4) Additional Instructions: ° ° ° ° ° ° ° °Please contact your physician with any problems or Same Day Surgery at [phone], Monday through Friday 6 am to 4 pm, or [place] at [facility] Main number at [phone]. °

## 2016-06-01 NOTE — Anesthesia Postprocedure Evaluation (Signed)
Anesthesia Post Note  Patient: Angel Chase  Procedure(s) Performed: Procedure(s) (LRB): CYSTOSCOPY WITH RETROGRADE PYELOGRAM (Bilateral) CYSTOSCOPY WITH STENT PLACEMENT (Left) URETEROSCOPY (Left) URETERAL DILITATION (Left) STONE EXTRACTION WITH BASKET (Left)  Patient location during evaluation: PACU Anesthesia Type: General Level of consciousness: awake and alert Pain management: pain level controlled Vital Signs Assessment: post-procedure vital signs reviewed and stable Respiratory status: spontaneous breathing and respiratory function stable Cardiovascular status: stable Anesthetic complications: no     Last Vitals:  Vitals:   06/01/16 0845 06/01/16 0855  BP:  135/78  Pulse: 83 80  Resp: 15 18  Temp:      Last Pain:  Vitals:   06/01/16 0855  TempSrc:   PainSc: 0-No pain                 KEPHART,WILLIAM K

## 2016-06-01 NOTE — Anesthesia Post-op Follow-up Note (Cosign Needed)
Anesthesia QCDR form completed.        

## 2016-06-09 ENCOUNTER — Other Ambulatory Visit: Payer: Self-pay | Admitting: Radiology

## 2016-06-09 ENCOUNTER — Encounter: Payer: Self-pay | Admitting: Urology

## 2016-06-09 ENCOUNTER — Ambulatory Visit (INDEPENDENT_AMBULATORY_CARE_PROVIDER_SITE_OTHER): Payer: BLUE CROSS/BLUE SHIELD | Admitting: Urology

## 2016-06-09 VITALS — BP 134/84 | HR 87 | Ht 71.0 in | Wt 299.7 lb

## 2016-06-09 DIAGNOSIS — R310 Gross hematuria: Secondary | ICD-10-CM

## 2016-06-09 DIAGNOSIS — N200 Calculus of kidney: Secondary | ICD-10-CM

## 2016-06-09 LAB — URINALYSIS, COMPLETE
Bilirubin, UA: NEGATIVE
GLUCOSE, UA: NEGATIVE
KETONES UA: NEGATIVE
NITRITE UA: NEGATIVE
Specific Gravity, UA: 1.025 (ref 1.005–1.030)
UUROB: 1 mg/dL (ref 0.2–1.0)
pH, UA: 7 (ref 5.0–7.5)

## 2016-06-09 LAB — MICROSCOPIC EXAMINATION: Epithelial Cells (non renal): NONE SEEN /hpf (ref 0–10)

## 2016-06-09 LAB — STONE ANALYSIS
CA OXALATE, MONOHYDR.: 97 %
Ca phos cry stone ql IR: 3 %
Stone Weight KSTONE: 5.3 mg

## 2016-06-09 MED ORDER — CIPROFLOXACIN HCL 500 MG PO TABS
500.0000 mg | ORAL_TABLET | Freq: Once | ORAL | Status: AC
  Administered 2016-06-09: 500 mg via ORAL

## 2016-06-09 MED ORDER — LIDOCAINE HCL 2 % EX GEL
1.0000 "application " | Freq: Once | CUTANEOUS | Status: AC
  Administered 2016-06-09: 1 via URETHRAL

## 2016-06-09 NOTE — Progress Notes (Signed)
   06/09/16  CC:  Chief Complaint  Patient presents with  . Cysto Stent Removal    1 week post op     HPI: The patient is a 51 year old gentleman that has had intermittent gross hematuria for the last 2 decades. He has has had negative hematuria workups in the past. He had CT scan with contrast without delayed phase images, so he present to the operating room recently for cystoscopy with bilateral retrograde polygrams. These were normal. However, during the procedure, he had obvious reflux of gross hematuria from his left ureteral orifice. He underwent ureteroscopy which was normal except for a 3 mm calculus which was removed. A ureteral stent was placed. He presents today for left ureteral stent removal in the office. The stone was likely not the source of his gross hematuria, he has no previous history of nephrolithiasis.  His hematuria has caused him to pass clots as well as drop his hemoglobin. He is currently requiring iron.  He did have angiography well over a decade ago that was negative. His hematuria is gotten significantly worse since that time though.  Blood pressure 134/84, pulse 87, height 5\' 11"  (1.803 m), weight 299 lb 11.2 oz (135.9 kg). NED. A&Ox3.   No respiratory distress   Abd soft, NT, ND Normal phallus with bilateral descended testicles  Cystoscopy Procedure Note  Patient identification was confirmed, informed consent was obtained, and patient was prepped using Betadine solution.  Lidocaine jelly was administered per urethral meatus.    Preoperative abx where received prior to procedure.     Pre-Procedure: - Inspection reveals a normal caliber ureteral meatus.  Procedure: The flexible cystoscope was introduced without difficulty - No urethral strictures/lesions are present. - Left ureteral stent removed intact with flexible graspers  Post-Procedure: - Patient tolerated the procedure well  Assessment/ Plan:  1. Unilateral gross hematuria from left  kidney 2. Nephrolithiasis I discussed with the patient again that his chronic gross hematuria that is causing anemia is isolated to his left kidney. He has no obvious source on ureteroscopy and nephroscopy. I'm concerned that he may have a AV malformation in his left kidney that is contributing to his gross hematuria. I think she would be best served at this point with angiography of his left kidney with possible embolization of any abnormality. We will arrange for him to see interventional radiology in the near future to undergo this procedure. I'll see him back after seeing him in one month after seeing interventional radiology with a renal ultrasound to rule out iatrogenic hydronephrosis.

## 2016-06-10 ENCOUNTER — Telehealth: Payer: Self-pay | Admitting: *Deleted

## 2016-06-10 NOTE — Telephone Encounter (Signed)
Called to report that the iron is giving him watery diarrhea twice a day. He reports that he is taking it on an empty stomach so I asked that he take it with food and call back if that does not improve. He was agreeable with this.

## 2016-06-16 ENCOUNTER — Ambulatory Visit
Admission: RE | Admit: 2016-06-16 | Discharge: 2016-06-16 | Disposition: A | Discharge status: Home / Self Care | Payer: BLUE CROSS/BLUE SHIELD | Source: Ambulatory Visit | Attending: Urology | Admitting: Urology

## 2016-06-16 DIAGNOSIS — R319 Hematuria, unspecified: Secondary | ICD-10-CM | POA: Diagnosis not present

## 2016-06-16 DIAGNOSIS — R310 Gross hematuria: Secondary | ICD-10-CM

## 2016-06-16 HISTORY — PX: IR RADIOLOGIST EVAL & MGMT: IMG5224

## 2016-06-16 NOTE — Consult Note (Signed)
Chief Complaint: Blood in my urine  Referring Physician(s): Hildred LaserBudzyn,Brian James  PCP: Dr. Sherryll BurgerShah, [place], [place]  History of Present Illness: Agnes LawrenceDaryl E Taussig is a 51 y.o. male presenting today as a scheduled appointment in Vascular & Interventional Radiology, kindly referred by Dr. Hadley PenBrian Budzyn of Urology, for evaluation for hematuria.    Mr Marvis MoellerMiles is present today with his wife for the interview.  He tells me that he has been having ongoing hematuria for about 20 years.  This was first treated as a possible infection, but he has never received a diagnosis, and there has been no time when he did not have hematuria for the past 20 years.    He reports varying degree of blood in the urine.  This ranges from tea-colored urine to frank red blood.  More recently over the past few months, he has been experiencing clots.  He denies and pain, flank pain, or painful urination.  He denies any frequency, urgency, or history of urinary obstruction.  He has never experienced noticeable fatigue or malaise.  He has never required a blood transfusion.    He tells me that Dr. Sherryll BurgerShah, his PCP, has intermittently checked his H&H, not prompted by symptoms, and a downward trend has been recognized over the past year or so.  In January of 2018, during an episode of diverticulitis, he was diagnosed with anemia.    He underwent retrograde pyelogram, cystoscopy, stone treatment, left nephroscopy, left ureteral balloon dilation, and left ureteral stenting with Dr. Sherryl BartersBudzyn on 06/01/2016, with removal of a collecting system stone.  The stent was removed in the office on 5/24.    Hematuria was observed at the left ureteral orifice and also within the collecting system, with no visualization of a potential source.    He tells me that he has had an [place] performed in [place] at [facility] many years ago, which was reported to be negative.    Past Medical History:  Diagnosis Date  . Anemia   . ED (erectile dysfunction)     . EKG abnormalities   . Hematuria   . Hematuria    x 20 yr per pt  . Hypertension   . Obesity   . Obstructive sleep apnea   . Vitamin D deficiency     Past Surgical History:  Procedure Laterality Date  . ANKLE SURGERY Left 03/07/2011   fracture from trauma  . CYSTOSCOPY W/ RETROGRADES Bilateral 06/01/2016   Procedure: CYSTOSCOPY WITH RETROGRADE PYELOGRAM;  Surgeon: Hildred LaserBudzyn, Brian James, MD;  Location: [facility] ORS;  Service: Urology;  Laterality: Bilateral;  . CYSTOSCOPY WITH STENT PLACEMENT Left 06/01/2016   Procedure: CYSTOSCOPY WITH STENT PLACEMENT;  Surgeon: Hildred LaserBudzyn, Brian James, MD;  Location: [facility] ORS;  Service: Urology;  Laterality: Left;  . STONE EXTRACTION WITH BASKET Left 06/01/2016   Procedure: STONE EXTRACTION WITH BASKET;  Surgeon: Hildred LaserBudzyn, Brian James, MD;  Location: [facility] ORS;  Service: Urology;  Laterality: Left;  . TONSILLECTOMY    . TONSILLECTOMY AND ADENOIDECTOMY  age 89  . URETEROSCOPY Left 06/01/2016   Procedure: URETEROSCOPY;  Surgeon: Hildred LaserBudzyn, Brian James, MD;  Location: [facility] ORS;  Service: Urology;  Laterality: Left;    Allergies: Patient has no known allergies.  Medications: Prior to Admission medications   Medication Sig Start Date End Date Taking? Authorizing Provider  ferrous sulfate 325 (65 FE) MG EC tablet Take 325 mg by mouth 2 (two) times daily with a meal. (0600 & 1530)   Yes [provider]  Multiple  Vitamin (MULTIVITAMIN WITH MINERALS) TABS tablet Take 1 tablet by mouth daily at 6 (six) AM.   Yes [provider]  ranitidine (ZANTAC) 75 MG tablet Take 75 mg by mouth 2 (two) times daily.   Yes [provider]  telmisartan-hydrochlorothiazide (MICARDIS HCT) 80-25 MG tablet TAKE 1 TABLET BY MOUTH DAILY 06/01/16  Yes Brayton El Asad A, MD  cephALEXin (KEFLEX) 500 MG capsule Take 1 capsule (500 mg total) by mouth 3 (three) times daily. Patient not taking: Reported on 06/09/2016 06/01/16   Hildred Laser, MD  HYDROcodone-acetaminophen  [facility]) 5-325 MG tablet Take 1 tablet by mouth every 4 (four) hours as needed for moderate pain. Patient not taking: Reported on 06/16/2016 06/01/16   Hildred Laser, MD  tadalafil (CIALIS) 20 MG tablet Take 20 mg by mouth daily as needed for erectile dysfunction.     [provider]     Family History  Problem Relation Age of Onset  . Breast cancer Mother   . Hypertension Mother   . Diabetes Father   . Hypertension Father   . Hypertension Sister   . Pancreatic disease Sister   . Diabetes Sister   . Hypertension Sister   . Hypertension Brother   . Diverticulosis Brother   . Prostate cancer Maternal Uncle   . Prostate cancer Maternal Uncle   . Kidney cancer Neg Hx   . Bladder Cancer Neg Hx     Social History   Social History  . Marital status: Married    Spouse name: Sherrie  . Number of children: 1  . 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 Smoking status: Never Smoker  . Smokeless tobacco: Never Used  . Alcohol use No  . Drug use: No  . Sexual activity: Yes   Other Topics Concern  . Not on file   Social History Narrative  . No narrative on file     Review of Systems: A 12 point ROS discussed and pertinent positives are indicated in the HPI above.  All other systems are negative.  Review of Systems  Vital Signs: BP 133/85   Pulse 86   Temp 98.4 F (36.9 C) (Oral)   Resp 14   Ht 5\' 11"  (1.803 m)   Wt 299 lb (135.6 kg)   SpO2 98%   BMI 41.70 kg/m   Physical Exam General: 51 yo male appearing stated age.  Well-developed, well-nourished.  No distress. HEENT: Atraumatic, normocephalic.  Conjugate gaze, extra-ocular motor intact. No scleral icterus or scleral injection. No lesions on external ears, nose, lips, or gums.  Oral mucosa moist, pink.  Neck: Symmetric with no goiter enlargement.  Chest/Lungs:  Symmetric chest with inspiration/expiration.  No labored breathing.   Clear to auscultation with no wheezes, rhonchi, or rales.  Heart:  RRR, with no third heart sounds appreciated. No JVD appreciated.  Abdomen:  Soft, NT/ND, with + bowel sounds.   Genito-urinary: Deferred Neurologic: Alert & Oriented to person, place, and time.   Normal affect and insight.  Appropriate questions.  Moving all 4 extremities with gross sensory intact.    Mallampati Score:  2  Imaging: No results found.  Labs:  CBC:  Recent Labs  02/05/16 1615 03/28/16 0950 04/28/16 1015 05/17/16 1420  WBC 10.6 7.3 6.8 6.8  HGB 11.6* 11.5* 10.9* 10.4*  HCT 36.5* 37.3* 35.9* 32.6*  PLT 283 290 295 306    COAGS:  No results for input(s): INR, APTT in the last 8760 hours.  BMP:  Recent Labs  02/05/16 1615 03/28/16 0950 05/18/16 1027  NA 130* 138 139  K 3.5 3.9 4.3  CL 97* 101 97  CO2 24 29 22   GLUCOSE 96 98 95  BUN 11 11 12   CALCIUM 8.7* 9.0 9.4  CREATININE 0.91 1.02 0.94  GFRNONAA >60 87 95  GFRAA >60 >89 110    LIVER FUNCTION TESTS:  Recent Labs  02/05/16 1615 03/28/16 0950  BILITOT 0.7 0.3  AST 17 16  ALT 18 16  ALKPHOS 83 81  PROT 7.5 6.8  ALBUMIN 3.9 4.0    TUMOR MARKERS: No results for input(s): AFPTM, CEA, CA199, CHROMGRNA in the last 8760 hours.  Assessment and Plan:  Mr Diekman is a 50 year old male with long standing gross hematuria of at least 20 years, which recently has led to asymptomatic anemia.  The source has been localized to the left kidney/collecting system, possibly the left ureter.    While uncommon, arteriovenous malformation, including fistulas, may be a source for long-standing bleeding.   Fraley Syndrome uncommonly has hematuria as the primary symptom without pain, and the prior CT shows no evidence of calyx dilation.    I discussed a diagnostic angiogram with Mr Chestnutt and his wife.  Specific risks discussed include: bleeding, infection, arterial injury/dissection, need for further procedure/surgery, contrast reaction, contrast  induced nephropathy/renal failure, cardiopulmonary collapse, death.    I discussed CTA as an alternative, but I told him that I frankly do not think the resolution is high enough (neither spatial or temporal), and that a diagnostic angiogram is better test.    Regarding a treatment plan, I think it is best to complete the angiogram, review the images with him, and discuss possible options.  I do not anticipate treating him on the same date.    I have discussed the case with Dr. Sherryl Barters.    I answered all of his and his wife's questions.    Plan: - Schedule for aortic, renal, and pelvic angiogram with Dr. Loreta Ave.    Thank you for this interesting consult.  I greatly enjoyed meeting [place] FANT and look forward to participating in their care.  A copy of this report was sent to the requesting provider on this date.  Electronically Signed: Gilmer Mor 06/16/2016, 8:45 AM   I spent a total of  40 Minutes   in face to face in clinical consultation, greater than 50% of which was counseling/coordinating care for hematuria, renal angiogram, possible treatment

## 2016-06-17 ENCOUNTER — Inpatient Hospital Stay: Payer: BLUE CROSS/BLUE SHIELD | Attending: Hematology and Oncology

## 2016-06-17 ENCOUNTER — Encounter: Payer: Self-pay | Admitting: Hematology and Oncology

## 2016-06-17 ENCOUNTER — Inpatient Hospital Stay (HOSPITAL_BASED_OUTPATIENT_CLINIC_OR_DEPARTMENT_OTHER): Payer: BLUE CROSS/BLUE SHIELD | Admitting: Hematology and Oncology

## 2016-06-17 ENCOUNTER — Other Ambulatory Visit (HOSPITAL_COMMUNITY): Payer: Self-pay | Admitting: Interventional Radiology

## 2016-06-17 VITALS — BP 118/71 | HR 74 | Temp 96.8°F | Resp 18 | Wt 303.2 lb

## 2016-06-17 DIAGNOSIS — K5732 Diverticulitis of large intestine without perforation or abscess without bleeding: Secondary | ICD-10-CM | POA: Diagnosis not present

## 2016-06-17 DIAGNOSIS — R310 Gross hematuria: Secondary | ICD-10-CM | POA: Diagnosis not present

## 2016-06-17 DIAGNOSIS — E559 Vitamin D deficiency, unspecified: Secondary | ICD-10-CM | POA: Diagnosis not present

## 2016-06-17 DIAGNOSIS — E785 Hyperlipidemia, unspecified: Secondary | ICD-10-CM | POA: Insufficient documentation

## 2016-06-17 DIAGNOSIS — I10 Essential (primary) hypertension: Secondary | ICD-10-CM | POA: Insufficient documentation

## 2016-06-17 DIAGNOSIS — E669 Obesity, unspecified: Secondary | ICD-10-CM | POA: Insufficient documentation

## 2016-06-17 DIAGNOSIS — Z79899 Other long term (current) drug therapy: Secondary | ICD-10-CM | POA: Insufficient documentation

## 2016-06-17 DIAGNOSIS — G4733 Obstructive sleep apnea (adult) (pediatric): Secondary | ICD-10-CM | POA: Diagnosis not present

## 2016-06-17 DIAGNOSIS — D500 Iron deficiency anemia secondary to blood loss (chronic): Secondary | ICD-10-CM

## 2016-06-17 LAB — CBC
HCT: 37.6 % — ABNORMAL LOW (ref 40.0–52.0)
Hemoglobin: 12.3 g/dL — ABNORMAL LOW (ref 13.0–18.0)
MCH: 24.4 pg — ABNORMAL LOW (ref 26.0–34.0)
MCHC: 32.7 g/dL (ref 32.0–36.0)
MCV: 74.6 fL — ABNORMAL LOW (ref 80.0–100.0)
PLATELETS: 230 10*3/uL (ref 150–440)
RBC: 5.04 MIL/uL (ref 4.40–5.90)
RDW: 23.4 % — AB (ref 11.5–14.5)
WBC: 7 10*3/uL (ref 3.8–10.6)

## 2016-06-17 LAB — RETICULOCYTES
RBC.: 5.12 MIL/uL (ref 4.40–5.90)
RETIC COUNT ABSOLUTE: 76.8 10*3/uL (ref 19.0–183.0)
Retic Ct Pct: 1.5 % (ref 0.4–3.1)

## 2016-06-17 LAB — IRON AND TIBC
Iron: 75 ug/dL (ref 45–182)
Saturation Ratios: 21 % (ref 17.9–39.5)
TIBC: 350 ug/dL (ref 250–450)
UIBC: 275 ug/dL

## 2016-06-17 LAB — VITAMIN B12: Vitamin B-12: 597 pg/mL (ref 180–914)

## 2016-06-17 LAB — FOLATE: FOLATE: 26 ng/mL (ref >5.9)

## 2016-06-17 LAB — FERRITIN: FERRITIN: 13 ng/mL — AB (ref 24–336)

## 2016-06-17 NOTE — Progress Notes (Signed)
Patient offers no concerns today. Patient of Dr. Rao. 

## 2016-06-17 NOTE — Progress Notes (Signed)
Hematology/Oncology Consult note [facility] [phone] [phone]  Patient Care Team: Ellyn HackShah, Syed Asad A, MD as PCP - General (Family Medicine)   Name of the patient: Angel Chase  [id number]  1965/02/18    Reason for referral- anemia   Referring physician- Dr. Brayton ElSyed Shah  Date of visit: 06/17/16   History of presenting illness- Patient is a 51 year old male who has a long standing history of hematuria. He has been seen by both nephrology and urology in the past to evaluate his hematuria and states he underwent extensive testing but no cause for hematuria was found. He has persistent hematuria all along sometimes tea colored sometimes bright red but was never anemic in the past. No family h/o bleeding disorders. No gum bleeds, nose bleeds or bleeding in his stool. He has had ankle surgery in the past without any bleeding issues.  He has been referred for evaluation and management of anemia.  CBC from 04/28/2016 showed white count of 6.8, H&H of 10.9/35.9 with an MCV of 72.5 and a platelet count of 295. Iron studies revealed a low serum iron of 18 and low iron saturation of 4%. TIBC was high normal at 407. Ferritin was low at 6.  Patient has seen urology for hematuria. He has also been referred to GI for h/o diverticulitis. He is on oral iron BID.  He states a stone was removed on the left side by Dr. Sherryl BartersBudzyn.  He is now scheduled for an arteriogram to investigate his hematuria further.  ECOG PS- 0  Pain scale- 0   Review of systems- Review of Systems  Constitutional: Negative.  Negative for chills, fever, malaise/fatigue and weight loss.  HENT: Negative.  Negative for congestion, ear discharge and nosebleeds.   Eyes: Negative.  Negative for blurred vision.  Respiratory: Negative.  Negative for cough, hemoptysis, sputum production, shortness of breath and wheezing.   Cardiovascular: Negative.  Negative for chest pain, palpitations, orthopnea  and claudication.  Gastrointestinal: Negative.  Negative for abdominal pain, blood in stool, constipation, diarrhea, heartburn, melena, nausea and vomiting.  Genitourinary: Positive for hematuria. Negative for dysuria, flank pain, frequency and urgency.  Musculoskeletal: Negative.  Negative for back pain, joint pain and myalgias.  Skin: Negative.  Negative for rash.  Neurological: Negative.  Negative for dizziness, tingling, focal weakness, seizures, weakness and headaches.  Endo/Heme/Allergies: Negative.  Does not bruise/bleed easily.  Psychiatric/Behavioral: Negative for depression and suicidal ideas. The patient does not have insomnia.     No Known Allergies  Patient Active Problem List   Diagnosis Date Noted  . Abnormal ECG 05/30/2016  . Anemia 04/28/2016  . Hematuria, gross 04/28/2016  . Encounter for annual physical exam 03/28/2016  . Diverticulitis large intestine w/o perforation or abscess w/bleeding 03/04/2016  . Hyperlipidemia 01/27/2015  . Benign essential hematuria 07/10/2014  . Failure of erection 07/10/2014  . Encounter for examination for driving license [id number]/23/2016  . BP (high blood pressure) 07/10/2014  . Extreme obesity 10/09/2013  . Avitaminosis D 10/09/2013  . Abdominal pain, left upper quadrant 10/09/2013     Past Medical History:  Diagnosis Date  . Anemia   . ED (erectile dysfunction)   . EKG abnormalities   . Hematuria   . Hematuria    x 20 yr per pt  . Hypertension   . Obesity   . Obstructive sleep apnea   . Vitamin D deficiency      Past Surgical History:  Procedure Laterality Date  . ANKLE SURGERY  Left 03/07/2011   fracture from trauma  . CYSTOSCOPY W/ RETROGRADES Bilateral 06/01/2016   Procedure: CYSTOSCOPY WITH RETROGRADE PYELOGRAM;  Surgeon: Hildred Laser, MD;  Location: [facility] ORS;  Service: Urology;  Laterality: Bilateral;  . CYSTOSCOPY WITH STENT PLACEMENT Left 06/01/2016   Procedure: CYSTOSCOPY WITH STENT PLACEMENT;  Surgeon:  Hildred Laser, MD;  Location: [facility] ORS;  Service: Urology;  Laterality: Left;  . STONE EXTRACTION WITH BASKET Left 06/01/2016   Procedure: STONE EXTRACTION WITH BASKET;  Surgeon: Hildred Laser, MD;  Location: [facility] ORS;  Service: Urology;  Laterality: Left;  . TONSILLECTOMY    . TONSILLECTOMY AND ADENOIDECTOMY  age 31  . URETEROSCOPY Left 06/01/2016   Procedure: URETEROSCOPY;  Surgeon: Hildred Laser, MD;  Location: [facility] ORS;  Service: Urology;  Laterality: Left;    Social History   Social History  . Marital status: Married    Spouse name: Sherrie  . Number of children: 1  . 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 Smoking status: Never Smoker  . Smokeless tobacco: Never Used  . Alcohol use No  . Drug use: No  . Sexual activity: Yes   Other Topics Concern  . Not on file   Social History Narrative  . No narrative on file     Family History  Problem Relation Age of Onset  . Breast cancer Mother   . Hypertension Mother   . Diabetes Father   . Hypertension Father   . Hypertension Sister   . Pancreatic disease Sister   . Diabetes Sister   . Hypertension Sister   . Hypertension Brother   . Diverticulosis Brother   . Prostate cancer Maternal Uncle   . Prostate cancer Maternal Uncle   . Kidney cancer Neg Hx   . Bladder Cancer Neg Hx      Current Outpatient Prescriptions:  .  ferrous sulfate 325 (65 FE) MG EC tablet, Take 325 mg by mouth 2 (two) times daily with a meal. (0600 & 1530), Disp: , Rfl:  .  Multiple Vitamin (MULTIVITAMIN WITH MINERALS) TABS tablet, Take 1 tablet by mouth daily at 6 (six) AM., Disp: , Rfl:  .  ranitidine (ZANTAC) 75 MG tablet, Take 75 mg by mouth 2 (two) times daily., Disp: , Rfl:  .  telmisartan-hydrochlorothiazide (MICARDIS HCT) 80-25 MG tablet, TAKE 1 TABLET BY MOUTH DAILY, Disp: 90 tablet, Rfl: 0 .  cephALEXin (KEFLEX) 500 MG capsule, Take 1 capsule (500 mg  total) by mouth 3 (three) times daily. (Patient not taking: Reported on 06/09/2016), Disp: 6 capsule, Rfl: 0 .  HYDROcodone-acetaminophen (NORCO) 5-325 MG tablet, Take 1 tablet by mouth every 4 (four) hours as needed for moderate pain. (Patient not taking: Reported on 06/16/2016), Disp: 30 tablet, Rfl: 0 .  tadalafil (CIALIS) 20 MG tablet, Take 20 mg by mouth daily as needed for erectile dysfunction. , Disp: , Rfl:    Physical exam:  Vitals:   06/17/16 1125  BP: 118/71  Pulse: 74  Resp: 18  Temp: (!) 96.8 F (36 C)  TempSrc: Tympanic  Weight: (!) 303 lb 3 oz (137.5 kg)   Physical Exam  Constitutional: He is oriented to person, place, and time and well-developed, well-nourished, and in no distress.  HENT:  Head: Normocephalic and atraumatic.  Eyes: Pupils are equal, round, and reactive to light. EOM are normal. No scleral icterus.  Neck: Normal range of motion. Neck  supple.  Cardiovascular: Normal rate, regular rhythm and normal heart sounds.   No murmur heard. Pulmonary/Chest: Effort normal and breath sounds normal.  Abdominal: Soft. Bowel sounds are normal. He exhibits no mass. There is no tenderness.  Lymphadenopathy:    He has no cervical adenopathy.  Neurological: He is alert and oriented to person, place, and time.  Skin: Skin is warm and dry.  Psychiatric: Affect normal.       CMP Latest Ref Rng & Units 05/18/2016  Glucose 65 - 99 mg/dL 95  BUN 6 - 24 mg/dL 12  Creatinine 1.61 - 0.96 mg/dL 0.45  Sodium 409 - 811 mmol/L 139  Potassium 3.5 - 5.2 mmol/L 4.3  Chloride 96 - 106 mmol/L 97  CO2 18 - 29 mmol/L 22  Calcium 8.7 - 10.2 mg/dL 9.4  Total Protein 6.1 - 8.1 g/dL -  Total Bilirubin 0.2 - 1.2 mg/dL -  Alkaline Phos 40 - 914 U/L -  AST 10 - 40 U/L -  ALT 9 - 46 U/L -   CBC Latest Ref Rng & Units 06/17/2016  WBC 3.8 - 10.6 K/uL 7.0  Hemoglobin 13.0 - 18.0 g/dL 12.3(L)  Hematocrit 40.0 - 52.0 % 37.6(L)  Platelets 150 - 440 K/uL 230    No images are attached to  the encounter.  No results found.  Assessment and plan- Patient is a 51 y.o. male with iron deficiency anemia likely from hematuria.  Await completion of urology and GI evaluation.  He is on oral iron 325 mg PO iron sulfate BID.  Hematocrit has improved from 32.6 to 37.6.  Hemoglobin has improved from 10.4 to 12.3.  Microcytic RBC indices have improved.  Ferritin is 13.  Ferritin goal is 100.  1.  Labs today:  CBC, ferritin, iron studies, retic, B12,  2.  Conitnue oral iron BID with OJ or vitamin C. 3.  RTC in 1 month for MD assessment and labs (CBC with diff, ferritin).   Visit Diagnosis 1. Iron deficiency anemia due to chronic blood loss     Rosey Bath, MD  [facility] 06/17/2016  12:02 PM

## 2016-06-20 ENCOUNTER — Telehealth (HOSPITAL_COMMUNITY): Payer: Self-pay

## 2016-06-20 NOTE — Telephone Encounter (Signed)
Called to schedule angiogram with Dr. Loreta AveWagner. Pt is getting ready to have a colonoscopy and would like to wait to schedule. Will schedule the beginning of July after his procedure per pt. AW

## 2016-07-01 ENCOUNTER — Ambulatory Visit
Admission: RE | Admit: 2016-07-01 | Discharge: 2016-07-01 | Disposition: A | Discharge status: Home / Self Care | Payer: BLUE CROSS/BLUE SHIELD | Source: Ambulatory Visit | Attending: Gastroenterology | Admitting: Gastroenterology

## 2016-07-01 ENCOUNTER — Ambulatory Visit: Payer: BLUE CROSS/BLUE SHIELD | Admitting: Registered Nurse

## 2016-07-01 ENCOUNTER — Encounter: Payer: Self-pay | Admitting: Interventional Radiology

## 2016-07-01 ENCOUNTER — Encounter
Admission: RE | Disposition: A | Discharge status: Home / Self Care | Payer: Self-pay | Source: Ambulatory Visit | Attending: Gastroenterology

## 2016-07-01 DIAGNOSIS — Z6841 Body Mass Index (BMI) 40.0 and over, adult: Secondary | ICD-10-CM | POA: Insufficient documentation

## 2016-07-01 DIAGNOSIS — K5790 Diverticulosis of intestine, part unspecified, without perforation or abscess without bleeding: Secondary | ICD-10-CM | POA: Diagnosis not present

## 2016-07-01 DIAGNOSIS — E669 Obesity, unspecified: Secondary | ICD-10-CM | POA: Diagnosis not present

## 2016-07-01 DIAGNOSIS — K621 Rectal polyp: Secondary | ICD-10-CM | POA: Insufficient documentation

## 2016-07-01 DIAGNOSIS — Z79899 Other long term (current) drug therapy: Secondary | ICD-10-CM | POA: Diagnosis not present

## 2016-07-01 DIAGNOSIS — N529 Male erectile dysfunction, unspecified: Secondary | ICD-10-CM | POA: Insufficient documentation

## 2016-07-01 DIAGNOSIS — G4733 Obstructive sleep apnea (adult) (pediatric): Secondary | ICD-10-CM | POA: Diagnosis not present

## 2016-07-01 DIAGNOSIS — D509 Iron deficiency anemia, unspecified: Secondary | ICD-10-CM | POA: Insufficient documentation

## 2016-07-01 DIAGNOSIS — K629 Disease of anus and rectum, unspecified: Secondary | ICD-10-CM

## 2016-07-01 DIAGNOSIS — I10 Essential (primary) hypertension: Secondary | ICD-10-CM | POA: Insufficient documentation

## 2016-07-01 DIAGNOSIS — K6289 Other specified diseases of anus and rectum: Secondary | ICD-10-CM | POA: Diagnosis not present

## 2016-07-01 DIAGNOSIS — E559 Vitamin D deficiency, unspecified: Secondary | ICD-10-CM | POA: Insufficient documentation

## 2016-07-01 DIAGNOSIS — K5730 Diverticulosis of large intestine without perforation or abscess without bleeding: Secondary | ICD-10-CM | POA: Diagnosis not present

## 2016-07-01 HISTORY — PX: ESOPHAGOGASTRODUODENOSCOPY (EGD) WITH PROPOFOL: SHX5813

## 2016-07-01 HISTORY — PX: COLONOSCOPY WITH PROPOFOL: SHX5780

## 2016-07-01 HISTORY — DX: Personal history of urinary calculi: Z87.442

## 2016-07-01 SURGERY — COLONOSCOPY WITH PROPOFOL
Anesthesia: General

## 2016-07-01 MED ORDER — LIDOCAINE HCL (PF) 2 % IJ SOLN
INTRAMUSCULAR | Status: AC
  Filled 2016-07-01: qty 2

## 2016-07-01 MED ORDER — SODIUM CHLORIDE 0.9 % IV SOLN
INTRAVENOUS | Status: DC
  Administered 2016-07-01: 1000 mL via INTRAVENOUS

## 2016-07-01 MED ORDER — FENTANYL CITRATE (PF) 100 MCG/2ML IJ SOLN
INTRAMUSCULAR | Status: DC | PRN
  Administered 2016-07-01: 50 ug via INTRAVENOUS

## 2016-07-01 MED ORDER — PROPOFOL 500 MG/50ML IV EMUL
INTRAVENOUS | Status: AC
  Filled 2016-07-01: qty 50

## 2016-07-01 MED ORDER — FENTANYL CITRATE (PF) 100 MCG/2ML IJ SOLN
INTRAMUSCULAR | Status: AC
  Filled 2016-07-01: qty 2

## 2016-07-01 MED ORDER — GLYCOPYRROLATE 0.2 MG/ML IJ SOLN
INTRAMUSCULAR | Status: DC | PRN
Start: 2016-07-01 — End: 2016-07-01
  Administered 2016-07-01: .2 mg via INTRAVENOUS

## 2016-07-01 MED ORDER — LIDOCAINE HCL (CARDIAC) 20 MG/ML IV SOLN
INTRAVENOUS | Status: DC | PRN
  Administered 2016-07-01: 40 mg via INTRAVENOUS

## 2016-07-01 MED ORDER — PROPOFOL 10 MG/ML IV BOLUS
INTRAVENOUS | Status: DC | PRN
  Administered 2016-07-01: 10 mg via INTRAVENOUS
  Administered 2016-07-01: 90 mg via INTRAVENOUS

## 2016-07-01 MED ORDER — PROPOFOL 500 MG/50ML IV EMUL
INTRAVENOUS | Status: DC | PRN
  Administered 2016-07-01: 150 ug/kg/min via INTRAVENOUS

## 2016-07-01 MED ORDER — PHENYLEPHRINE HCL 10 MG/ML IJ SOLN
INTRAMUSCULAR | Status: DC | PRN
  Administered 2016-07-01: 100 ug via INTRAVENOUS

## 2016-07-01 MED ORDER — GLYCOPYRROLATE 0.2 MG/ML IJ SOLN
INTRAMUSCULAR | Status: AC
  Filled 2016-07-01: qty 1

## 2016-07-01 NOTE — Op Note (Signed)
[facility] Gastroenterology Patient Name: Angel HecklerDaryl Trower Procedure Date: 07/01/2016 12:46 PM [id number] Account #: [id number] Date of Birth: [birth year] Admit Type: Outpatient Age: 51 Room: [facility] ENDO ROOM 3 Gender: Male Note Status: Finalized Procedure:            Upper GI endoscopy Indications:          Iron deficiency anemia Providers:            Wyline MoodKiran Jarmaine Ehrler MD, MD Referring MD:         No Local Md, MD (Referring MD) Medicines:            Monitored Anesthesia Care Complications:        No immediate complications. Procedure:            Pre-Anesthesia Assessment:                       - Prior to the procedure, a History and Physical was                        performed, and patient medications, allergies and                        sensitivities were reviewed. The patient's tolerance of                        previous anesthesia was reviewed.                       - The risks and benefits of the procedure and the                        sedation options and risks were discussed with the                        patient. All questions were answered and informed                        consent was obtained.                       - ASA Grade Assessment: II - A patient with mild                        systemic disease.                       After obtaining informed consent, the endoscope was                        passed under direct vision. Throughout the procedure,                        the patient's blood pressure, pulse, and oxygen                        saturations were monitored continuously. The                        Colonoscope was introduced through the mouth, and  advanced to the third part of duodenum. The upper GI                        endoscopy was accomplished with ease. The patient                        tolerated the procedure well. Findings:      The esophagus was normal.      The stomach was normal.      The examined duodenum  was normal. Biopsies for histology were taken with       a cold forceps for evaluation of celiac disease. Impression:           - Normal esophagus.                       - Normal stomach.                       - Normal examined duodenum. Biopsied. Recommendation:       - Await pathology results.                       - Perform a colonoscopy today. Procedure Code(s):    --- Professional ---                       [phone], Esophagogastroduodenoscopy, flexible, transoral;                        with biopsy, single or multiple Diagnosis Code(s):    --- Professional ---                       D50.9, Iron deficiency anemia, unspecified CPT copyright 2016 American Medical Association. All rights reserved. The codes documented in this report are preliminary and upon coder review may  be revised to meet current compliance requirements. Wyline Mood, MD Wyline Mood MD, MD 07/01/2016 12:54:12 PM This report has been signed electronically. Number of Addenda: 0 Note Initiated On: 07/01/2016 12:46 PM      [facility]

## 2016-07-01 NOTE — Anesthesia Postprocedure Evaluation (Signed)
Anesthesia Post Note  Patient: Angel Chase  Procedure(s) Performed: Procedure(s) (LRB): COLONOSCOPY WITH PROPOFOL (N/A) ESOPHAGOGASTRODUODENOSCOPY (EGD) WITH PROPOFOL (N/A)  Patient location during evaluation: PACU Anesthesia Type: General Level of consciousness: awake and alert and oriented Pain management: pain level controlled Vital Signs Assessment: post-procedure vital signs reviewed and stable Respiratory status: spontaneous breathing Cardiovascular status: blood pressure returned to baseline Postop Assessment: no headache Anesthetic complications: no     Last Vitals:  Vitals:   07/01/16 1327 07/01/16 1337  BP: 126/80 (!) 141/92  Pulse: 86 64  Resp: 15 12  Temp:      Last Pain:  Vitals:   07/01/16 1317  TempSrc: Tympanic                 [name]

## 2016-07-01 NOTE — Anesthesia Post-op Follow-up Note (Cosign Needed)
Anesthesia QCDR form completed.        

## 2016-07-01 NOTE — Transfer of Care (Signed)
Immediate Anesthesia Transfer of Care Note  Patient: Agnes LawrenceDaryl E Lisenbee  Procedure(s) Performed: Procedure(s): COLONOSCOPY WITH PROPOFOL (N/A) ESOPHAGOGASTRODUODENOSCOPY (EGD) WITH PROPOFOL (N/A)  Patient Location: PACU  Anesthesia Type:General  Level of Consciousness: sedated  Airway & Oxygen Therapy: Patient Spontanous Breathing and Patient connected to nasal cannula oxygen  Post-op Assessment: Report given to RN and Post -op Vital signs reviewed and stable  Post vital signs: Reviewed and stable  Last Vitals:  Vitals:   07/01/16 1124 07/01/16 1317  BP: 123/76 116/66  Pulse: 81 86  Resp: 18 17  Temp: 36.2 C 36.3 C    Last Pain:  Vitals:   07/01/16 1317  TempSrc: Tympanic         Complications: No apparent anesthesia complications

## 2016-07-01 NOTE — H&P (Signed)
Wyline Mood MD [address], [address] [place], [place] [address] Phone: [phone] Fax : [phone]  Primary Care Physician:  Ellyn Hack, MD Primary Gastroenterologist:  Dr. Wyline Mood   Pre-Procedure History & Physical: HPI:  Angel Chase is a 51 y.o. male is here for an endoscopy and colonoscopy.   Past Medical History:  Diagnosis Date  . Anemia   . ED (erectile dysfunction)   . EKG abnormalities   . Hematuria   . Hematuria    x 20 yr per pt  . History of kidney stones   . Hypertension   . Obesity   . Obstructive sleep apnea   . Vitamin D deficiency     Past Surgical History:  Procedure Laterality Date  . ANKLE SURGERY Left 03/07/2011   fracture from trauma  . CYSTOSCOPY W/ RETROGRADES Bilateral 06/01/2016   Procedure: CYSTOSCOPY WITH RETROGRADE PYELOGRAM;  Surgeon: Hildred Laser, MD;  Location: [facility];  Service: Urology;  Laterality: Bilateral;  . CYSTOSCOPY WITH STENT PLACEMENT Left 06/01/2016   Procedure: CYSTOSCOPY WITH STENT PLACEMENT;  Surgeon: Hildred Laser, MD;  Location: [facility];  Service: Urology;  Laterality: Left;  . IR RADIOLOGIST EVAL & MGMT  06/16/2016  . STONE EXTRACTION WITH BASKET Left 06/01/2016   Procedure: STONE EXTRACTION WITH BASKET;  Surgeon: Hildred Laser, MD;  Location: [facility];  Service: Urology;  Laterality: Left;  . TONSILLECTOMY    . TONSILLECTOMY AND ADENOIDECTOMY  age 13  . URETEROSCOPY Left 06/01/2016   Procedure: URETEROSCOPY;  Surgeon: Hildred Laser, MD;  Location: [facility];  Service: Urology;  Laterality: Left;    Prior to Admission medications   Medication Sig Start Date End Date Taking? Authorizing Provider  ferrous sulfate 325 (65 FE) MG EC tablet Take 325 mg by mouth 2 (two) times daily with a meal. (0600 & 1530)    [provider]  Multiple Vitamin (MULTIVITAMIN WITH MINERALS) TABS tablet Take 1 tablet by mouth daily at 6 (six) AM.    [provider]  ranitidine (ZANTAC) 75  MG tablet Take 75 mg by mouth 2 (two) times daily.    [provider]  tadalafil (CIALIS) 20 MG tablet Take 20 mg by mouth daily as needed for erectile dysfunction.     [provider]  telmisartan-hydrochlorothiazide (MICARDIS HCT) 80-25 MG tablet TAKE 1 TABLET BY MOUTH DAILY 06/01/16   Ellyn Hack, MD    Allergies as of 05/30/2016  . (No Known Allergies)    Family History  Problem Relation Age of Onset  . Breast cancer Mother   . Hypertension Mother   . Diabetes Father   . Hypertension Father   . Hypertension Sister   . Pancreatic disease Sister   . Diabetes Sister   . Hypertension Sister   . Hypertension Brother   . Diverticulosis Brother   . Prostate cancer Maternal Uncle   . Prostate cancer Maternal Uncle   . Kidney cancer Neg Hx   . Bladder Cancer Neg Hx     Social History   Social History  . Marital status: Married    Spouse name: Sherrie  . Number of children: 1  . 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 Smoking status: Never Smoker  . Smokeless tobacco: Never Used  . Alcohol use No  . Drug use: No  . Sexual activity: Yes   Other  Topics Concern  . Not on file   Social History Narrative  . No narrative on file    Review of Systems: See HPI, otherwise negative ROS  Physical Exam: BP 123/76   Pulse 81   Temp 97.1 F (36.2 C) (Tympanic)   Resp 18   Ht 5\' 11"  (1.803 m)   Wt 300 lb (136.1 kg)   SpO2 97%   BMI 41.84 kg/m  General:   Alert,  pleasant and cooperative in NAD Head:  Normocephalic and atraumatic. Neck:  Supple; no masses or thyromegaly. Lungs:  Clear throughout to auscultation.    Heart:  Regular rate and rhythm. Abdomen:  Soft, nontender and nondistended. Normal bowel sounds, without guarding, and without rebound.   Neurologic:  Alert and  oriented x4;  grossly normal neurologically.  Impression/Plan: Angel Chase is here for an  endoscopy and colonoscopy to be performed for iron deficiency anemia   Risks, benefits, limitations, and alternatives regarding  endoscopy and colonoscopy have been reviewed with the patient.  Questions have been answered.  All parties agreeable.   Wyline MoodKiran Ciin Brazzel, MD  07/01/2016, 12:41 PM

## 2016-07-01 NOTE — Op Note (Signed)
[facility] Gastroenterology Patient Name: Angel Chase Procedure Date: 07/01/2016 12:45 PM MRN: [id number] Account #: [id number] Date of Birth: 03-Sep-1965 Admit Type: Outpatient Age: 51 Room: [facility] ENDO ROOM 3 Gender: Male Note Status: Finalized Procedure:            Colonoscopy Indications:          Iron deficiency anemia Providers:            Wyline Mood MD, MD Referring MD:         No Local Md, MD (Referring MD) Medicines:            Monitored Anesthesia Care Complications:        No immediate complications. Procedure:            Pre-Anesthesia Assessment:                       - Prior to the procedure, a History and Physical was                        performed, and patient medications, allergies and                        sensitivities were reviewed. The patient's tolerance of                        previous anesthesia was reviewed.                       - The risks and benefits of the procedure and the                        sedation options and risks were discussed with the                        patient. All questions were answered and informed                        consent was obtained.                       - ASA Grade Assessment: II - A patient with mild                        systemic disease.                       After obtaining informed consent, the colonoscope was                        passed under direct vision. Throughout the procedure,                        the patient's blood pressure, pulse, and oxygen                        saturations were monitored continuously. The                        Colonoscope was introduced through the anus and  advanced to the the cecum, identified by the                        appendiceal orifice, IC valve and transillumination.                        The colonoscopy was performed with ease. The patient                        tolerated the procedure well. The quality of the bowel                preparation was good. Findings:      The perianal and digital rectal examinations were normal.      A few small-mouthed diverticula were found in the sigmoid colon.      A lesion lesion was found in the distal rectum over a fold- I wasnt sure       if its a thickened fold or adenomatous tissue. Under NBI didnt have the       typical pitted appearance. . Biopsies were taken with a cold forceps for       histology.      The exam was otherwise without abnormality on direct and retroflexion       views. Impression:           - Diverticulosis in the sigmoid colon.                       - Rule out malignancy, polypoid lesion in the distal                        rectum. Biopsied.                       - The examination was otherwise normal on direct and                        retroflexion views. Recommendation:       - Discharge patient to home (with escort).                       - Resume previous diet.                       - Continue present medications.                       - Await pathology results.                       - If the bx shows adenomatous tissue then will need                        excision of the lesion. Very likely next step would be                        a rectal ultrasound                       - Return to my office in 2 weeks. Procedure Code(s):    --- Professional ---  40981, Colonoscopy, flexible; with biopsy, single or                        multiple Diagnosis Code(s):    --- Professional ---                       D49.0, Neoplasm of unspecified behavior of digestive                        system                       D50.9, Iron deficiency anemia, unspecified                       K57.30, Diverticulosis of large intestine without                        perforation or abscess without bleeding CPT copyright 2016 American Medical Association. All rights reserved. The codes documented in this report are preliminary and upon coder  review may  be revised to meet current compliance requirements. Wyline Mood, MD Wyline Mood MD, MD 07/01/2016 1:13:07 PM This report has been signed electronically. Number of Addenda: 0 Note Initiated On: 07/01/2016 12:45 PM Scope Withdrawal Time: 0 hours 12 minutes 13 seconds  Total Procedure Duration: 0 hours 14 minutes 16 seconds       [facility]

## 2016-07-01 NOTE — Anesthesia Preprocedure Evaluation (Signed)
Anesthesia Evaluation  Patient identified by MRN, date of birth, ID band Patient awake    Reviewed: Allergy & Precautions, NPO status , Patient's Chart, lab work & pertinent test results  History of Anesthesia Complications Negative for: history of anesthetic complications  Airway Mallampati: III       Dental   Pulmonary sleep apnea ,    Pulmonary exam normal        Cardiovascular hypertension, Pt. on medications Normal cardiovascular exam     Neuro/Psych negative neurological ROS  negative psych ROS   GI/Hepatic Neg liver ROS, GERD  Medicated and Controlled,  Endo/Other  negative endocrine ROS  Renal/GU negative Renal ROS     Musculoskeletal   Abdominal (+) + obese,   Peds negative pediatric ROS (+)  Hematology  (+) anemia ,   Anesthesia Other Findings   Reproductive/Obstetrics                             Anesthesia Physical  Anesthesia Plan  ASA: II  Anesthesia Plan: General   Post-op Pain Management:    Induction: Intravenous  PONV Risk Score and Plan:   Airway Management Planned: Nasal Cannula  Additional Equipment:   Intra-op Plan:   Post-operative Plan:   Informed Consent: I have reviewed the patients History and Physical, chart, labs and discussed the procedure including the risks, benefits and alternatives for the proposed anesthesia with the patient or authorized representative who has indicated his/her understanding and acceptance.     Plan Discussed with:   Anesthesia Plan Comments:         Anesthesia Quick Evaluation

## 2016-07-04 ENCOUNTER — Encounter: Payer: Self-pay | Admitting: Gastroenterology

## 2016-07-04 LAB — SURGICAL PATHOLOGY

## 2016-07-05 ENCOUNTER — Telehealth: Payer: Self-pay

## 2016-07-05 NOTE — Telephone Encounter (Signed)
Advised patient of results per Dr. Tobi BastosAnna.   Inform all biopsies were normal   The area in the rectum looked a bit abnormal on endoscopy and hence suggest a flexible sigmoidoscopy in 3-4 months- please set up reminder in system

## 2016-07-05 NOTE — Telephone Encounter (Signed)
-----   Message from Wyline MoodKiran [name], MD sent at 07/04/2016 10:21 AM EDT ----- Angel Chase  Inform all biopsies were normal   The area in the rectum looked a bit abnormal on endoscopy and hence suggest a flexible sigmoidoscopy in 3-4 months- please set up reminder in system

## 2016-07-14 ENCOUNTER — Ambulatory Visit: Payer: BLUE CROSS/BLUE SHIELD

## 2016-07-27 ENCOUNTER — Other Ambulatory Visit: Payer: Self-pay | Admitting: Radiology

## 2016-07-28 ENCOUNTER — Encounter (HOSPITAL_COMMUNITY): Payer: Self-pay

## 2016-07-28 ENCOUNTER — Ambulatory Visit (HOSPITAL_COMMUNITY)
Admission: RE | Admit: 2016-07-28 | Discharge: 2016-07-28 | Disposition: A | Discharge status: Home / Self Care | Payer: BLUE CROSS/BLUE SHIELD | Source: Ambulatory Visit | Attending: Interventional Radiology | Admitting: Interventional Radiology

## 2016-07-28 ENCOUNTER — Other Ambulatory Visit (HOSPITAL_COMMUNITY): Payer: Self-pay | Admitting: Interventional Radiology

## 2016-07-28 DIAGNOSIS — E669 Obesity, unspecified: Secondary | ICD-10-CM | POA: Insufficient documentation

## 2016-07-28 DIAGNOSIS — E559 Vitamin D deficiency, unspecified: Secondary | ICD-10-CM | POA: Diagnosis not present

## 2016-07-28 DIAGNOSIS — R319 Hematuria, unspecified: Secondary | ICD-10-CM | POA: Insufficient documentation

## 2016-07-28 DIAGNOSIS — Z6841 Body Mass Index (BMI) 40.0 and over, adult: Secondary | ICD-10-CM | POA: Insufficient documentation

## 2016-07-28 DIAGNOSIS — Z87442 Personal history of urinary calculi: Secondary | ICD-10-CM | POA: Diagnosis not present

## 2016-07-28 DIAGNOSIS — R310 Gross hematuria: Secondary | ICD-10-CM

## 2016-07-28 DIAGNOSIS — I10 Essential (primary) hypertension: Secondary | ICD-10-CM | POA: Insufficient documentation

## 2016-07-28 DIAGNOSIS — N529 Male erectile dysfunction, unspecified: Secondary | ICD-10-CM | POA: Diagnosis not present

## 2016-07-28 DIAGNOSIS — G4733 Obstructive sleep apnea (adult) (pediatric): Secondary | ICD-10-CM | POA: Diagnosis not present

## 2016-07-28 HISTORY — PX: IR US GUIDE VASC ACCESS LEFT: IMG2389

## 2016-07-28 HISTORY — PX: IR RENAL SELECTIVE  UNI INC S&I MOD SED: IMG654

## 2016-07-28 LAB — BASIC METABOLIC PANEL
Anion gap: 8 (ref 5–15)
BUN: 13 mg/dL (ref 6–20)
CALCIUM: 8.9 mg/dL (ref 8.9–10.3)
CO2: 24 mmol/L (ref 22–32)
CREATININE: 0.87 mg/dL (ref 0.61–1.24)
Chloride: 103 mmol/L (ref 101–111)
GFR calc Af Amer: >60 mL/min (ref >60)
GLUCOSE: 96 mg/dL (ref 65–99)
POTASSIUM: 3.8 mmol/L (ref 3.5–5.1)
SODIUM: 135 mmol/L (ref 135–145)

## 2016-07-28 LAB — CBC
HEMATOCRIT: 42.3 % (ref 39.0–52.0)
Hemoglobin: 13.7 g/dL (ref 13.0–17.0)
MCH: 25.9 pg — ABNORMAL LOW (ref 26.0–34.0)
MCHC: 32.4 g/dL (ref 30.0–36.0)
MCV: 80 fL (ref 78.0–100.0)
PLATELETS: 212 10*3/uL (ref 150–400)
RBC: 5.29 MIL/uL (ref 4.22–5.81)
RDW: 19.9 % — AB (ref 11.5–15.5)
WBC: 5.7 10*3/uL (ref 4.0–10.5)

## 2016-07-28 LAB — PROTIME-INR
INR: 0.96
PROTHROMBIN TIME: 12.8 s (ref 11.4–15.2)

## 2016-07-28 LAB — APTT: APTT: 28 s (ref 24–36)

## 2016-07-28 MED ORDER — VERAPAMIL HCL 2.5 MG/ML IV SOLN
INTRAVENOUS | Status: AC
  Filled 2016-07-28: qty 2

## 2016-07-28 MED ORDER — MIDAZOLAM HCL 2 MG/2ML IJ SOLN
INTRAMUSCULAR | Status: AC
  Filled 2016-07-28: qty 6

## 2016-07-28 MED ORDER — IOPAMIDOL (ISOVUE-300) INJECTION 61%
INTRAVENOUS | Status: AC
  Administered 2016-07-28: 60 mL
  Filled 2016-07-28: qty 100

## 2016-07-28 MED ORDER — HEPARIN SODIUM (PORCINE) 1000 UNIT/ML IJ SOLN
INTRAMUSCULAR | Status: AC
  Filled 2016-07-28: qty 1

## 2016-07-28 MED ORDER — LIDOCAINE HCL (PF) 1 % IJ SOLN
INTRAMUSCULAR | Status: AC | PRN
  Administered 2016-07-28: 5 mL

## 2016-07-28 MED ORDER — LIDOCAINE HCL (PF) 1 % IJ SOLN
INTRAMUSCULAR | Status: AC
  Filled 2016-07-28: qty 30

## 2016-07-28 MED ORDER — FENTANYL CITRATE (PF) 100 MCG/2ML IJ SOLN
INTRAMUSCULAR | Status: AC | PRN
  Administered 2016-07-28 (×2): 25 ug via INTRAVENOUS
  Administered 2016-07-28: 50 ug via INTRAVENOUS

## 2016-07-28 MED ORDER — VERAPAMIL HCL 2.5 MG/ML IV SOLN
INTRA_ARTERIAL | Status: AC | PRN
  Administered 2016-07-28: 10:00:00 via INTRA_ARTERIAL

## 2016-07-28 MED ORDER — SODIUM CHLORIDE 0.9 % IV SOLN: INTRAVENOUS | Status: DC

## 2016-07-28 MED ORDER — NITROGLYCERIN 1 MG/10 ML FOR IR/CATH LAB
INTRA_ARTERIAL | Status: AC
  Administered 2016-07-28: 200 ug
  Filled 2016-07-28: qty 10

## 2016-07-28 MED ORDER — IOPAMIDOL (ISOVUE-300) INJECTION 61%
INTRAVENOUS | Status: AC
  Administered 2016-07-28: 50 mL
  Filled 2016-07-28: qty 100

## 2016-07-28 MED ORDER — MIDAZOLAM HCL 2 MG/2ML IJ SOLN
INTRAMUSCULAR | Status: AC | PRN
  Administered 2016-07-28 (×2): 1 mg via INTRAVENOUS

## 2016-07-28 MED ORDER — FENTANYL CITRATE (PF) 100 MCG/2ML IJ SOLN
INTRAMUSCULAR | Status: AC
  Filled 2016-07-28: qty 6

## 2016-07-28 MED ORDER — IOPAMIDOL (ISOVUE-300) INJECTION 61%
INTRAVENOUS | Status: AC
  Administered 2016-07-28: 40 mL
  Filled 2016-07-28: qty 150

## 2016-07-28 NOTE — Discharge Instructions (Addendum)
Angiogram, Care After °This sheet gives you information about how to care for yourself after your procedure. Your health care provider may also give you more specific instructions. If you have problems or questions, contact your health care provider. °What can I expect after the procedure? °After the procedure, it is common to have bruising and tenderness at the catheter insertion area. °Follow these instructions at home: °Insertion site care °· Follow instructions from your health care provider about how to take care of your insertion site. Make sure you: °? Wash your hands with soap and water before you change your bandage (dressing). If soap and water are not available, use hand sanitizer. °? Change your dressing as told by your health care provider. °? Leave stitches (sutures), skin glue, or adhesive strips in place. These skin closures may need to stay in place for 2 weeks or longer. If adhesive strip edges start to loosen and curl up, you may trim the loose edges. Do not remove adhesive strips completely unless your health care provider tells you to do that. °· Do not take baths, swim, or use a hot tub until your health care provider approves. °· You may shower 24-48 hours after the procedure or as told by your health care provider. °? Gently wash the site with plain soap and water. °? Pat the area dry with a clean towel. °? Do not rub the site. This may cause bleeding. °· Do not apply powder or lotion to the site. Keep the site clean and dry. °· Check your insertion site every day for signs of infection. Check for: °? Redness, swelling, or pain. °? Fluid or blood. °? Warmth. °? Pus or a bad smell. °Activity °· Rest as told by your health care provider, usually for 1-2 days. °· Do not lift anything that is heavier than 10 lbs. (4.5 kg) or as told by your health care provider. °· Do not drive for 24 hours if you were given a medicine to help you relax (sedative). °· Do not drive or use heavy machinery while  taking prescription pain medicine. °General instructions °· Return to your normal activities as told by your health care provider, usually in about a week. Ask your health care provider what activities are safe for you. °· If the catheter site starts bleeding, lie flat and put pressure on the site. If the bleeding does not stop, get help right away. This is a medical emergency. °· Drink enough fluid to keep your urine clear or pale yellow. This helps flush the contrast dye from your body. °· Take over-the-counter and prescription medicines only as told by your health care provider. °· Keep all follow-up visits as told by your health care provider. This is important. °Contact a health care provider if: °· You have a fever or chills. °· You have redness, swelling, or pain around your insertion site. °· You have fluid or blood coming from your insertion site. °· The insertion site feels warm to the touch. °· You have pus or a bad smell coming from your insertion site. °· You have bruising around the insertion site. °· You notice blood collecting in the tissue around the catheter site (hematoma). The hematoma may be painful to the touch. °Get help right away if: °· You have severe pain at the catheter insertion area. °· The catheter insertion area swells very fast. °· The catheter insertion area is bleeding, and the bleeding does not stop when you hold steady pressure on the area. °·   The area near or just beyond the catheter insertion site becomes pale, cool, tingly, or numb. °These symptoms may represent a serious problem that is an emergency. Do not wait to see if the symptoms will go away. Get medical help right away. Call your local emergency services (911 in the [place] Do not drive yourself to the hospital. °Summary °· After the procedure, it is common to have bruising and tenderness at the catheter insertion area. °· After the procedure, it is important to rest and drink plenty of fluids. °· Do not take baths,  swim, or use a hot tub until your health care provider says it is okay to do so. You may shower 24-48 hours after the procedure or as told by your health care provider. °· If the catheter site starts bleeding, lie flat and put pressure on the site. If the bleeding does not stop, get help right away. This is a medical emergency. °This information is not intended to replace advice given to you by your health care provider. Make sure you discuss any questions you have with your health care provider. °Document Released: 07/22/2004 Document Revised: 12/09/2015 Document Reviewed: 12/09/2015 °Elsevier Interactive Patient Education © 2017 [employer] °Moderate Conscious Sedation, Adult, Care After °These instructions provide you with information about caring for yourself after your procedure. Your health care provider may also give you more specific instructions. Your treatment has been planned according to current medical practices, but problems sometimes occur. Call your health care provider if you have any problems or questions after your procedure. °What can I expect after the procedure? °After your procedure, it is common: °· To feel sleepy for several hours. °· To feel clumsy and have poor balance for several hours. °· To have poor judgment for several hours. °· To vomit if you eat too soon. ° °Follow these instructions at home: °For at least 24 hours after the procedure: ° °· Do not: °? Participate in activities where you could fall or become injured. °? Drive. °? Use heavy machinery. °? Drink alcohol. °? Take sleeping pills or medicines that cause drowsiness. °? Make important decisions or sign legal documents. °? Take care of children on your own. °· Rest. °Eating and drinking °· Follow the diet recommended by your health care provider. °· If you vomit: °? Drink water, juice, or soup when you can drink without vomiting. °? Make sure you have little or no nausea before eating solid foods. °General  instructions °· Have a responsible adult stay with you until you are awake and alert. °· Take over-the-counter and prescription medicines only as told by your health care provider. °· If you smoke, do not smoke without supervision. °· Keep all follow-up visits as told by your health care provider. This is important. °Contact a health care provider if: °· You keep feeling nauseous or you keep vomiting. °· You feel light-headed. °· You develop a rash. °· You have a fever. °Get help right away if: °· You have trouble breathing. °This information is not intended to replace advice given to you by your health care provider. Make sure you discuss any questions you have with your health care provider. °Document Released: 10/24/2012 Document Revised: 06/08/2015 Document Reviewed: 04/25/2015 °Elsevier Interactive Patient Education © 2018 [employer] ° ° °

## 2016-07-28 NOTE — Procedures (Signed)
Interventional Radiology Procedure Note  Procedure: Left radial arterial access.  Aortic angiogram.  Left Renal artery angiogram with high speed DSA performed.   Complications: None  Findings: - Full findings on pending dictation - Single left renal artery, no accessory - No parenchymal lesion, no AVM, no early venous shunting, no abnormal artery identified - No collateral flow from the pelvis - Palpable left radial pulse on application of TR band (patent compression).   Recommendations:  - Left TR band in-place, to Short stay recovery for management.  Once the TR band is successfully removed with hemostasis at the puncture site, pt may be DC'd home - Advance diet - May ambulate to bathroom when sedation goals met - Do not submerge wrist for 7 days - Oral hydration at home - Routine wound care   Signed,  Dulcy Fanny. Earleen Newport, DO

## 2016-07-28 NOTE — Consult Note (Signed)
Chief Complaint: Patient was seen in consultation today for hematuria  Referring Physician(s):  Dr. Baruch Gouty  Supervising Physician: Corrie Mckusick  Patient Status: [facility] - Out-pt  History of Present Illness: Angel Chase is a 51 y.o. male with past medical history of HTN, kidney stones, and 20 years of hematuria of unknown source.   Patient met with Dr. Earleen Newport in [place] clinic for assessment on 06/16/16.  Per his report, he underwent retrograde pyelogram, cystoscopy, stone treatment, left nephroscopy, left ureteral balloon dilation, and left ureteral stenting with Dr. Pilar Jarvis on 06/01/2016, with removal of a collecting system stone.  The stent was removed in the office on 5/24. Hematuria was observed at the left ureteral orifice and also within the collecting system, with no visualization of a potential source.    Request for angiogram by Dr. Baruch Gouty.  Patient presents for procedure today.  He has been NPO.  He does not take blood thinners.    Past Medical History:  Diagnosis Date  . Anemia   . ED (erectile dysfunction)   . EKG abnormalities   . Hematuria   . Hematuria    x 20 yr per pt  . History of kidney stones   . Hypertension   . Obesity   . Obstructive sleep apnea   . Vitamin D deficiency     Past Surgical History:  Procedure Laterality Date  . ANKLE SURGERY Left 03/07/2011   fracture from trauma  . COLONOSCOPY WITH PROPOFOL N/A 07/01/2016   Procedure: COLONOSCOPY WITH PROPOFOL;  Surgeon: Jonathon Bellows, MD;  Location: [facility] ENDOSCOPY;  Service: Endoscopy;  Laterality: N/A;  . CYSTOSCOPY W/ RETROGRADES Bilateral 06/01/2016   Procedure: CYSTOSCOPY WITH RETROGRADE PYELOGRAM;  Surgeon: Nickie Retort, MD;  Location: [facility] ORS;  Service: Urology;  Laterality: Bilateral;  . CYSTOSCOPY WITH STENT PLACEMENT Left 06/01/2016   Procedure: CYSTOSCOPY WITH STENT PLACEMENT;  Surgeon: Nickie Retort, MD;  Location: [facility] ORS;  Service: Urology;  Laterality: Left;  .  ESOPHAGOGASTRODUODENOSCOPY (EGD) WITH PROPOFOL N/A 07/01/2016   Procedure: ESOPHAGOGASTRODUODENOSCOPY (EGD) WITH PROPOFOL;  Surgeon: Jonathon Bellows, MD;  Location: [facility] ENDOSCOPY;  Service: Endoscopy;  Laterality: N/A;  . IR RADIOLOGIST EVAL & MGMT  06/16/2016  . STONE EXTRACTION WITH BASKET Left 06/01/2016   Procedure: STONE EXTRACTION WITH BASKET;  Surgeon: Nickie Retort, MD;  Location: [facility] ORS;  Service: Urology;  Laterality: Left;  . TONSILLECTOMY    . TONSILLECTOMY AND ADENOIDECTOMY  age 38  . URETEROSCOPY Left 06/01/2016   Procedure: URETEROSCOPY;  Surgeon: Nickie Retort, MD;  Location: [facility] ORS;  Service: Urology;  Laterality: Left;    Allergies: Patient has no known allergies.  Medications: Prior to Admission medications   Medication Sig Start Date End Date Taking? Authorizing Provider  ferrous sulfate 325 (65 FE) MG EC tablet Take 325 mg by mouth 2 (two) times daily with a meal. (0600 & 1530)   Yes [provider]  Multiple Vitamin (MULTIVITAMIN WITH MINERALS) TABS tablet Take 1 tablet by mouth daily at 6 (six) AM.   Yes [provider]  ranitidine (ZANTAC) 75 MG tablet Take 75 mg by mouth 2 (two) times daily as needed for heartburn.    Yes [provider]  telmisartan-hydrochlorothiazide (MICARDIS HCT) 80-25 MG tablet TAKE 1 TABLET BY MOUTH DAILY 06/01/16  Yes Keith Rake Asad A, MD  tadalafil (CIALIS) 20 MG tablet Take 20 mg by mouth daily as needed for erectile dysfunction.     [provider]     Family History  Problem Relation Age of Onset  . Breast cancer Mother   . Hypertension Mother   . Diabetes Father   . Hypertension Father   . Hypertension Sister   . Pancreatic disease Sister   . Diabetes Sister   . Hypertension Sister   . Hypertension Brother   . Diverticulosis Brother   . Prostate cancer Maternal Uncle   . Prostate cancer Maternal Uncle   . Kidney cancer Neg Hx   . Bladder Cancer Neg Hx     Social History    Social History  . Marital status: Married    Spouse name: Sherrie  . Number of children: 1  . Years of education: N/A   Occupational History  . [employer] teacher    Social History Main Topics  . Smoking status: Never Smoker  . Smokeless tobacco: Never Used  . Alcohol use No  . Drug use: No  . Sexual activity: Yes   Other Topics Concern  . None   Social History Narrative  . None    Review of Systems  Constitutional: Negative for fatigue and fever.  Respiratory: Negative for cough and shortness of breath.   Cardiovascular: Negative for chest pain.  Genitourinary: Positive for hematuria.  Psychiatric/Behavioral: Negative for behavioral problems and confusion.    Vital Signs: BP 122/80   Pulse 74   Temp 98.3 F (36.8 C) (Oral)   Resp 18   Ht _0  (1.803 m)   Wt 291 lb (132 kg)   SpO2 99%   BMI 40.59 kg/m   Physical Exam  Constitutional: He is oriented to person, place, and time. He appears well-developed.  Cardiovascular: Normal rate, regular rhythm and normal heart sounds.   Pulmonary/Chest: Effort normal and breath sounds normal. No respiratory distress.  Neurological: He is alert and oriented to person, place, and time.  Skin: Skin is warm and dry.  Psychiatric: He has a normal mood and affect. His behavior is normal. Judgment and thought content normal.  Nursing note and vitals reviewed.   Mallampati Score:  MD Evaluation Airway: WNL Heart: WNL Abdomen: WNL Chest/ Lungs: WNL ASA  Classification: 3 Mallampati/Airway Score: Two  Imaging: No results found.  Labs:  CBC:  Recent Labs  03/28/16 0950 04/28/16 1015 05/17/16 1420 06/17/16 1044  WBC 7.3 6.8 6.8 7.0  HGB 11.5* 10.9* 10.4* 12.3*  HCT 37.3* 35.9* 32.6* 37.6*  PLT 290 295 306 230    COAGS: No results for input(s): INR, APTT in the last 8760 hours.  BMP:  Recent Labs  02/05/16 1615 03/28/16 0950 05/18/16 1027  NA 130* 138 139  K 3.5 3.9 4.3  CL 97* 101 97   CO2 _1 GLUCOSE 96 98 95  BUN _2 CALCIUM 8.7* 9.0 9.4  CREATININE 0.91 1.02 0.94  GFRNONAA >60 87 95  GFRAA >60 >89 110    LIVER FUNCTION TESTS:  Recent Labs  02/05/16 1615 03/28/16 0950  BILITOT 0.7 0.3  AST 17 16  ALT 18 16  ALKPHOS 83 81  PROT 7.5 6.8  ALBUMIN 3.9 4.0    TUMOR MARKERS: No results for input(s): AFPTM, CEA, CA199, CHROMGRNA in the last 8760 hours.  Assessment and Plan: Patient with long history of hematuria with unknown source presents for pelvic angiogram at the request of Dr. Baruch Gouty.  Patient met with Dr. Earleen Newport in [place] clinic 06/16/16 to discuss procedure.  Patient was felt to be  an appropriate candidate.  He presents for procedure today in his usual state of health.  He has been NPO.  He does not take blood thinners.  Risks and benefits discussed with the patient including, but not limited to bleeding, infection, vascular injury or contrast induced renal failure. All of the patient's questions were answered, patient is agreeable to proceed. Consent signed and in chart.  Thank you for this interesting consult.  I greatly enjoyed meeting FAIZON CAPOZZI and look forward to participating in their care.  A copy of this report was sent to the requesting provider on this date.  Electronically Signed: Docia Barrier, PA 07/28/2016, 8:24 AM   I spent a total of    15 Minutes in face to face in clinical consultation, greater than 50% of which was counseling/coordinating care for hematuria

## 2016-07-28 NOTE — Sedation Documentation (Signed)
TR band placed on pt's left wrist with 13cc of air injected into the balloon.

## 2016-07-29 ENCOUNTER — Other Ambulatory Visit (HOSPITAL_COMMUNITY): Payer: Self-pay | Admitting: Interventional Radiology

## 2016-07-29 ENCOUNTER — Encounter (HOSPITAL_COMMUNITY): Payer: Self-pay

## 2016-07-29 ENCOUNTER — Ambulatory Visit: Payer: BLUE CROSS/BLUE SHIELD | Admitting: Family Medicine

## 2016-07-29 DIAGNOSIS — R310 Gross hematuria: Secondary | ICD-10-CM

## 2016-08-01 ENCOUNTER — Ambulatory Visit: Payer: BLUE CROSS/BLUE SHIELD | Admitting: Hematology and Oncology

## 2016-08-01 ENCOUNTER — Encounter: Payer: Self-pay | Admitting: Family Medicine

## 2016-08-01 ENCOUNTER — Ambulatory Visit (INDEPENDENT_AMBULATORY_CARE_PROVIDER_SITE_OTHER): Payer: BLUE CROSS/BLUE SHIELD | Admitting: Family Medicine

## 2016-08-01 ENCOUNTER — Other Ambulatory Visit: Payer: BLUE CROSS/BLUE SHIELD

## 2016-08-01 ENCOUNTER — Other Ambulatory Visit: Payer: Self-pay

## 2016-08-01 VITALS — BP 120/73 | HR 95 | Temp 98.4°F | Resp 17 | Ht 71.0 in | Wt 295.1 lb

## 2016-08-01 DIAGNOSIS — I10 Essential (primary) hypertension: Secondary | ICD-10-CM

## 2016-08-01 DIAGNOSIS — R310 Gross hematuria: Secondary | ICD-10-CM | POA: Diagnosis not present

## 2016-08-01 DIAGNOSIS — D500 Iron deficiency anemia secondary to blood loss (chronic): Secondary | ICD-10-CM

## 2016-08-01 DIAGNOSIS — D508 Other iron deficiency anemias: Secondary | ICD-10-CM

## 2016-08-01 MED ORDER — TELMISARTAN-HCTZ 80-25 MG PO TABS: 1.0000 | ORAL_TABLET | Freq: Every day | ORAL | 0 refills | Status: DC

## 2016-08-01 NOTE — Progress Notes (Signed)
Name: Agnes LawrenceDaryl E Kluger   MRN: [id number]    DOB: [birth year]   Date:08/01/2016       Progress Note  Subjective  Chief Complaint  Chief Complaint  Patient presents with  . Follow-up    3 mo  . Medication Refill    Hypertension  This is a chronic problem. The problem is unchanged. The problem is controlled. Pertinent negatives include no anxiety, blurred vision, headaches or palpitations. Past treatments include angiotensin blockers and diuretics. There is no history of kidney disease, CAD/MI, CVA or heart failure.     Past Medical History:  Diagnosis Date  . Anemia   . ED (erectile dysfunction)   . EKG abnormalities   . Hematuria   . Hematuria    x 20 yr per pt  . History of kidney stones   . Hypertension   . Obesity   . Obstructive sleep apnea   . Vitamin D deficiency     Past Surgical History:  Procedure Laterality Date  . ANKLE SURGERY Left 03/07/2011   fracture from trauma  . COLONOSCOPY WITH PROPOFOL N/A 07/01/2016   Procedure: COLONOSCOPY WITH PROPOFOL;  Surgeon: Wyline MoodAnna, Kiran, MD;  [facility] ENDOSCOPY;  Service: Endoscopy;  Laterality: N/A;  . CYSTOSCOPY W/ RETROGRADES Bilateral 06/01/2016   Procedure: CYSTOSCOPY WITH RETROGRADE PYELOGRAM;  Surgeon: Hildred LaserBudzyn, Brian James, MD;  Location: [facility] ORS;  Service: Urology;  Laterality: Bilateral;  . CYSTOSCOPY WITH STENT PLACEMENT Left 06/01/2016   Procedure: CYSTOSCOPY WITH STENT PLACEMENT;  Surgeon: Hildred LaserBudzyn, Brian James, MD;  Location: [facility] ORS;  Service: Urology;  Laterality: Left;  . ESOPHAGOGASTRODUODENOSCOPY (EGD) WITH PROPOFOL N/A 07/01/2016   Procedure: ESOPHAGOGASTRODUODENOSCOPY (EGD) WITH PROPOFOL;  Surgeon: Wyline MoodAnna, Kiran, MD;  [facility];  Service: Endoscopy;  Laterality: N/A;  . IR RADIOLOGIST EVAL & MGMT  06/16/2016  . IR RENAL SELECTIVE  UNI INC S&I MOD SED  07/28/2016  . IR US GUIDE VASC ACCESS LEFT  07/28/2016  . STONE EXTRACTION WITH BASKET Left 06/01/2016   Procedure: STONE EXTRACTION WITH BASKET;   Surgeon: Hildred LaserBudzyn, Brian James, MD;  Location: [facility] ORS;  Service: Urology;  Laterality: Left;  . TONSILLECTOMY    . TONSILLECTOMY AND ADENOIDECTOMY  age 89  . URETEROSCOPY Left 06/01/2016   Procedure: URETEROSCOPY;  Surgeon: Hildred LaserBudzyn, Brian James, MD;  Location: [facility] ORS;  Service: Urology;  Laterality: Left;    Family History  Problem Relation Age of Onset  . Breast cancer Mother   . Hypertension Mother   . Diabetes Father   . Hypertension Father   . Hypertension Sister   . Pancreatic disease Sister   . Diabetes Sister   . Hypertension Sister   . Hypertension Brother   . Diverticulosis Brother   . Prostate cancer Maternal Uncle   . Prostate cancer Maternal Uncle   . Kidney cancer Neg Hx   . Bladder Cancer Neg Hx     Social History   Social History  . Marital status: Married    Spouse name: Sherrie  . Number of children: 1  . Years of education: N/A   Occupational History  . Utility [employer]    Social History Main Topics  . Smoking status: Never Smoker  . Smokeless tobacco: Never Used  . Alcohol use No  . Drug use: No  . Sexual activity: Yes   Other Topics Concern  . Not on file   Social History Narrative  . No narrative on file     Current Outpatient Prescriptions:  .  ferrous sulfate 325 (65 FE) MG EC tablet, Take 325 mg by mouth 2 (two) times daily with a meal. (0600 & 1530), Disp: , Rfl:  .  Multiple Vitamin (MULTIVITAMIN WITH MINERALS) TABS tablet, Take 1 tablet by mouth daily at 6 (six) AM., Disp: , Rfl:  .  ranitidine (ZANTAC) 75 MG tablet, Take 75 mg by mouth 2 (two) times daily as needed for heartburn. , Disp: , Rfl:  .  telmisartan-hydrochlorothiazide (MICARDIS HCT) 80-25 MG tablet, TAKE 1 TABLET BY MOUTH DAILY, Disp: 90 tablet, Rfl: 0 .  tadalafil (CIALIS) 20 MG tablet, Take 20 mg by mouth daily as needed for erectile dysfunction. , Disp: , Rfl:   No Known Allergies   Review of Systems  Eyes: Negative for blurred vision.  Cardiovascular:  Negative for palpitations.  Neurological: Negative for headaches.      Objective  Vitals:   08/01/16 1031  BP: 120/73  Pulse: 95  Resp: 17  Temp: 98.4 F (36.9 C)  TempSrc: Oral  SpO2: 96%  Weight: 295 lb 1.6 oz (133.9 kg)  Height: 5\' 11"  (1.803 m)    Physical Exam  Constitutional: He is oriented to person, place, and time and well-developed, well-nourished, and in no distress.  HENT:  Head: Normocephalic and atraumatic.  Cardiovascular: Normal rate, regular rhythm and normal heart sounds.   No murmur heard. Pulmonary/Chest: Effort normal and breath sounds normal. He has no wheezes.  Abdominal: Soft. Bowel sounds are normal. There is no tenderness.  Genitourinary: Rectum normal.  Musculoskeletal: He exhibits no edema.  Neurological: He is alert and oriented to person, place, and time.  Skin: Skin is warm and dry.  Psychiatric: Mood, memory, affect and judgment normal.  Nursing note and vitals reviewed.      Assessment & Plan  1. Essential hypertension BP stable on present antihypertensive therapy - telmisartan-hydrochlorothiazide (MICARDIS HCT) 80-25 MG tablet; Take 1 tablet by mouth daily.  Dispense: 90 tablet; Refill: 0  2. Iron deficiency anemia due to chronic blood loss Now CBC is normal, continues on iron, reviewed Hematology notes.  3. Gross hematuria Now improved but still persistent, reviewed Urology notes and discussed with patient in detail   Tulio Facundo Asad A. [facility] 08/01/2016 10:39 AM

## 2016-08-02 ENCOUNTER — Inpatient Hospital Stay: Payer: BLUE CROSS/BLUE SHIELD | Admitting: Oncology

## 2016-08-02 ENCOUNTER — Inpatient Hospital Stay: Payer: BLUE CROSS/BLUE SHIELD

## 2016-08-04 ENCOUNTER — Inpatient Hospital Stay: Payer: BLUE CROSS/BLUE SHIELD

## 2016-08-04 ENCOUNTER — Inpatient Hospital Stay: Payer: BLUE CROSS/BLUE SHIELD | Attending: Oncology | Admitting: Oncology

## 2016-08-04 ENCOUNTER — Encounter: Payer: Self-pay | Admitting: Oncology

## 2016-08-04 VITALS — BP 151/95 | HR 76 | Temp 97.6°F | Ht 71.0 in | Wt 295.0 lb

## 2016-08-04 DIAGNOSIS — D509 Iron deficiency anemia, unspecified: Secondary | ICD-10-CM | POA: Diagnosis not present

## 2016-08-04 DIAGNOSIS — Z79899 Other long term (current) drug therapy: Secondary | ICD-10-CM | POA: Diagnosis not present

## 2016-08-04 DIAGNOSIS — E559 Vitamin D deficiency, unspecified: Secondary | ICD-10-CM | POA: Insufficient documentation

## 2016-08-04 DIAGNOSIS — I10 Essential (primary) hypertension: Secondary | ICD-10-CM | POA: Insufficient documentation

## 2016-08-04 DIAGNOSIS — R319 Hematuria, unspecified: Secondary | ICD-10-CM

## 2016-08-04 DIAGNOSIS — Z803 Family history of malignant neoplasm of breast: Secondary | ICD-10-CM

## 2016-08-04 DIAGNOSIS — G4730 Sleep apnea, unspecified: Secondary | ICD-10-CM | POA: Diagnosis not present

## 2016-08-04 DIAGNOSIS — Z87442 Personal history of urinary calculi: Secondary | ICD-10-CM | POA: Diagnosis not present

## 2016-08-04 DIAGNOSIS — D500 Iron deficiency anemia secondary to blood loss (chronic): Secondary | ICD-10-CM

## 2016-08-04 DIAGNOSIS — N529 Male erectile dysfunction, unspecified: Secondary | ICD-10-CM

## 2016-08-04 DIAGNOSIS — Z8042 Family history of malignant neoplasm of prostate: Secondary | ICD-10-CM | POA: Diagnosis not present

## 2016-08-04 DIAGNOSIS — E669 Obesity, unspecified: Secondary | ICD-10-CM | POA: Diagnosis not present

## 2016-08-04 LAB — CBC WITH DIFFERENTIAL/PLATELET
Basophils Absolute: 0.1 10*3/uL (ref 0–0.1)
Basophils Relative: 1 %
Eosinophils Absolute: 0 10*3/uL (ref 0–0.7)
Eosinophils Relative: 1 %
HCT: 41.1 % (ref 40.0–52.0)
Hemoglobin: 13.9 g/dL (ref 13.0–18.0)
Lymphocytes Relative: 17 %
Lymphs Abs: 1.2 10*3/uL (ref 1.0–3.6)
MCH: 26.7 pg (ref 26.0–34.0)
MCHC: 33.8 g/dL (ref 32.0–36.0)
MCV: 79.1 fL — ABNORMAL LOW (ref 80.0–100.0)
Monocytes Absolute: 0.9 10*3/uL (ref 0.2–1.0)
Monocytes Relative: 12 %
Neutro Abs: 5 10*3/uL (ref 1.4–6.5)
Neutrophils Relative %: 69 %
Platelets: 220 10*3/uL (ref 150–440)
RBC: 5.19 MIL/uL (ref 4.40–5.90)
RDW: 21.2 % — ABNORMAL HIGH (ref 11.5–14.5)
WBC: 7.3 10*3/uL (ref 3.8–10.6)

## 2016-08-04 LAB — FERRITIN: Ferritin: 21 ng/mL — ABNORMAL LOW (ref 24–336)

## 2016-08-04 NOTE — Progress Notes (Signed)
Hematology/Oncology Consult note [facility]  [phone] [phone]  Patient Care Team: Ellyn Hack, MD as PCP - General (Family Medicine)   Name of the patient: Angel Chase  [id number]  1966/01/16   Date of visit: 08/04/16  Diagnosis- iron deficiency anemia likely due to hematuria  Chief complaint/ Reason for visit- routine f/u  Heme/Onc history:  Patient is a 51 year old male who has a long standing history of hematuria. He has been seen by both nephrology and urology in the past to evaluate his hematuria and states he underwent extensive testing but no cause for hematuria was found. He has persistent hematuria all along sometimes tea colored sometimes bright red but was never anemic in the past. No family h/o bleeding disorders. No gum bleeds, nose bleeds or bleeding in his stool. He has had ankle surgery in the past without any bleeding issues.  He has been referred to [place] for evaluation and management of anemia. Most recent CBC from 04/28/2016 showed white count of 6.8, H&H of 10.9/35.9 with an MCV of 72.5 and a platelet count of 295. Iron studies revealed a low serum iron of 18 and low iron saturation of 4%. TIBC was high normal at 407. Ferritin was low at 6. Patient is yet to see urology for hematuria. He has also been referred to GI for h/o diverticulitis. He has never tried oral iron  Also seen by Dr. Tobi Bastos from GI and underwent EGD and colonoscopy which was unremarkable  He has not received IV iron so far and remains on oral iron  Interval history- continues to have intermittent hematuria being followed by urology  ECOG PS- 0 Pain scale- 0  Review of systems- Review of Systems  Constitutional: Negative for chills, fever, malaise/fatigue and weight loss.  HENT: Negative for congestion, ear discharge and nosebleeds.   Eyes: Negative for blurred vision.  Respiratory: Negative for cough, hemoptysis, sputum production,  shortness of breath and wheezing.   Cardiovascular: Negative for chest pain, palpitations, orthopnea and claudication.  Gastrointestinal: Negative for abdominal pain, blood in stool, constipation, diarrhea, heartburn, melena, nausea and vomiting.  Genitourinary: Negative for dysuria, flank pain, frequency, hematuria and urgency.  Musculoskeletal: Negative for back pain, joint pain and myalgias.  Skin: Negative for rash.  Neurological: Negative for dizziness, tingling, focal weakness, seizures, weakness and headaches.  Endo/Heme/Allergies: Does not bruise/bleed easily.  Psychiatric/Behavioral: Negative for depression and suicidal ideas. The patient does not have insomnia.      Current treatment- oral iron  No Known Allergies   Past Medical History:  Diagnosis Date  . Anemia   . ED (erectile dysfunction)   . EKG abnormalities   . Hematuria   . Hematuria    x 20 yr per pt  . History of kidney stones   . Hypertension   . Obesity   . Obstructive sleep apnea   . Vitamin D deficiency      Past Surgical History:  Procedure Laterality Date  . ANKLE SURGERY Left 03/07/2011   fracture from trauma  . COLONOSCOPY WITH PROPOFOL N/A 07/01/2016   Procedure: COLONOSCOPY WITH PROPOFOL;  Surgeon: Wyline Mood, MD;  Location: [facility] ENDOSCOPY;  Service: Endoscopy;  Laterality: N/A;  . CYSTOSCOPY W/ RETROGRADES Bilateral 06/01/2016   Procedure: CYSTOSCOPY WITH RETROGRADE PYELOGRAM;  Surgeon: Hildred Laser, MD;  Location: [facility] ORS;  Service: Urology;  Laterality: Bilateral;  . CYSTOSCOPY WITH STENT PLACEMENT Left 06/01/2016   Procedure: CYSTOSCOPY WITH STENT PLACEMENT;  Surgeon:  Hildred Laser, MD;  Location: [facility] ORS;  Service: Urology;  Laterality: Left;  . ESOPHAGOGASTRODUODENOSCOPY (EGD) WITH PROPOFOL N/A 07/01/2016   Procedure: ESOPHAGOGASTRODUODENOSCOPY (EGD) WITH PROPOFOL;  Surgeon: Wyline Mood, MD;  Location[facility] ENDOSCOPY;  Service: Endoscopy;  Laterality: N/A;  . IR RADIOLOGIST  EVAL & MGMT  06/16/2016  . IR RENAL SELECTIVE  UNI INC S&I MOD SED  07/28/2016  . IR US GUIDE VASC ACCESS LEFT  07/28/2016  . STONE EXTRACTION WITH BASKET Left 06/01/2016   Procedure: STONE EXTRACTION WITH BASKET;  Surgeon: Hildred Laser, MD;  Location: [facility] ORS;  Service: Urology;  Laterality: Left;  . TONSILLECTOMY    . TONSILLECTOMY AND ADENOIDECTOMY  age 33  . URETEROSCOPY Left 06/01/2016   Procedure: URETEROSCOPY;  Surgeon: Hildred Laser, MD;  Location: [facility] ORS;  Service: Urology;  Laterality: Left;    Social History   Social History  . Marital status: Married    Spouse name: Sherrie  . Number of children: 1  . 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 Smoking status: Never Smoker  . Smokeless tobacco: Never Used  . Alcohol use No  . Drug use: No  . Sexual activity: Yes   Other Topics Concern  . Not on file   Social History Narrative  . No narrative on file    Family History  Problem Relation Age of Onset  . Breast cancer Mother   . Hypertension Mother   . Diabetes Father   . Hypertension Father   . Hypertension Sister   . Pancreatic disease Sister   . Diabetes Sister   . Hypertension Sister   . Hypertension Brother   . Diverticulosis Brother   . Prostate cancer Maternal Uncle   . Prostate cancer Maternal Uncle   . Kidney cancer Neg Hx   . Bladder Cancer Neg Hx      Current Outpatient Prescriptions:  .  ferrous sulfate 325 (65 FE) MG EC tablet, Take 325 mg by mouth 2 (two) times daily with a meal. (0600 & 1530), Disp: , Rfl:  .  Multiple Vitamin (MULTIVITAMIN WITH MINERALS) TABS tablet, Take 1 tablet by mouth daily at 6 (six) AM., Disp: , Rfl:  .  ranitidine (ZANTAC) 75 MG tablet, Take 75 mg by mouth 2 (two) times daily as needed for heartburn. , Disp: , Rfl:  .  tadalafil (CIALIS) 20 MG tablet, Take 20 mg by mouth daily as needed for erectile dysfunction. , Disp: , Rfl:  .   telmisartan-hydrochlorothiazide (MICARDIS HCT) 80-25 MG tablet, Take 1 tablet by mouth daily., Disp: 90 tablet, Rfl: 0  Physical exam:  Vitals:   08/04/16 1526  BP: (!) 151/95  Pulse: 76  Temp: 97.6 F (36.4 C)  TempSrc: Tympanic  Weight: 295 lb (133.8 kg)  Height: 5\' 11"  (1.803 m)   Physical Exam  Constitutional: He is oriented to person, place, and time and well-developed, well-nourished, and in no distress.  HENT:  Head: Normocephalic and atraumatic.  Eyes: Pupils are equal, round, and reactive to light. EOM are normal.  Neck: Normal range of motion.  Cardiovascular: Normal rate, regular rhythm and normal heart sounds.   Pulmonary/Chest: Effort normal and breath sounds normal.  Abdominal: Soft. Bowel sounds are normal.  Neurological: He is alert and oriented to person, place, and time.  Skin: Skin is warm and dry.     CMP Latest Ref Rng & Units  07/28/2016  Glucose 65 - 99 mg/dL 96  BUN 6 - 20 mg/dL 13  Creatinine 1.61 - 0.96 mg/dL 0.45  Sodium 409 - 811 mmol/L 135  Potassium 3.5 - 5.1 mmol/L 3.8  Chloride 101 - 111 mmol/L 103  CO2 22 - 32 mmol/L 24  Calcium 8.9 - 10.3 mg/dL 8.9  Total Protein 6.1 - 8.1 g/dL -  Total Bilirubin 0.2 - 1.2 mg/dL -  Alkaline Phos 40 - 914 U/L -  AST 10 - 40 U/L -  ALT 9 - 46 U/L -   CBC Latest Ref Rng & Units 08/04/2016  WBC 3.8 - 10.6 K/uL 7.3  Hemoglobin 13.0 - 18.0 g/dL 78.2  Hematocrit 95.6 - 52.0 % 41.1  Platelets 150 - 440 K/uL 220    No images are attached to the encounter.  Ir Angiogram Renal Left Selective  Result Date: 07/28/2016 INDICATION: 51 year old male with a history of hematuria, confirmed to be left-sided EXAM: ULTRASOUND GUIDED ACCESS LEFT RADIAL ARTERY AORTIC ANGIOGRAM LEFT RENAL ANGIOGRAM MEDICATIONS: 3000 units heparin intra- arterial, 2.5 mg verapamil, intra- arterial, 200 mcg nitroglycerin intraarterial. ANESTHESIA/SEDATION: Moderate (conscious) sedation was employed during this procedure. A total of Versed 2.0  mg and Fentanyl 100 mcg was administered intravenously. Moderate Sedation Time: 58 minutes. The patient's level of consciousness and vital signs were monitored continuously by radiology nursing throughout the procedure under my direct supervision. CONTRAST:  One hundred fifty Isovue-300 FLUOROSCOPY TIME:  Fluoroscopy Time: 6 minutes 12 seconds (1,075 mGy). COMPLICATIONS: None PROCEDURE: Informed consent was obtained from the patient following explanation of the procedure, risks, benefits and alternatives. The patient understands, agrees and consents for the procedure. All questions were addressed. A time out was performed prior to the initiation of the procedure. Maximal barrier sterile technique utilized including caps, mask, sterile gowns, sterile gloves, large sterile drape, hand hygiene, and Betadine prep. Allen test at the left hand was performed, confirming adequate perfusion of the hand by both radial and ulnar artery. Ultrasound survey of the left radial artery was performed with images stored and sent to PACs. 3 mm -4 mm diameter was confirmed. A micropuncture needle was used access the left radial artery under ultrasound. With excellent arterial blood flow returned, and an .018 micro wire was passed through the needle, observed enter the radial artery under ultrasound. The needle was removed, and 5 [place] glide sheath slender was placed over the wire. The inner dilator and wire were removed, and radial cocktail was infused through the sheath. Benson wire was navigated to the brachial artery, over which 125 cm 5 French angled catheter was advanced. Bentson wire an the angled catheter were advanced into the descending thoracic aorta. Once the catheter was at the level of diaphragm, exchange Rosen wire was placed and the angled catheter were exchanged for a 100 cm pigtail catheter. Pigtail catheter was position at the level the diaphragm and aortic angiogram was performed. The image intensifier was then  repositioned over the pelvic region and pelvic angiogram was performed. Rosen wire was then passed through the pigtail catheter and exchanged for 125 cm angled catheter. Using a Glidewire, angled catheter was positioned in the main left renal artery. Left renal angiogram was then performed with multiple planes. Catheter and wires were removed. The left radial artery sheath was removed with placement of TR band. Patient tolerated the procedure well and remained hemodynamically stable throughout. No complications were encountered and no significant blood loss. FINDINGS: Ultrasound survey of the left radial artery demonstrates 3.3  mm artery. Allen test demonstrates adequate perfusion of the hand with both radial artery and ulnar artery compression. Aortic angiogram demonstrates patent mesenteric vessels and bilateral single renal arteries. Aortic angiogram only partially images the right kidney, given the field-of-view. There is asymmetric washout of the corticomedullary phase of the visualize right kidney versus the left. The left appears somewhat delayed. On dedicated renal angiogram of the left renal artery, there are no stenoses, occlusions, string of beads configuration. No venous shunting identified. No parenchymal lesion is identified. The entire parenchyma is opacified from single left renal artery with no collateral flow identified. There is indistinct cortico medullary differentiation on the parenchymal phase of the angiogram, with questionable slow flow through distal vasculature. The indistinct and patchy parenchymal phase is most pronounced at the superior aspect of the left kidney. IMPRESSION: Status post aortic, pelvic, and left renal angiogram for investigation of left-sided hematuria with no arteriovenous malformation, venous shunting, or parenchymal lesion identified. There is indistinct corticomedullary perfusion of the left kidney with questionable slow flow within distal vasculature. This finding  could represent underlying medical renal condition such as chronic glomerulonephritis, or polyarteritis nodosa. Signed, Yvone Neu. Loreta Ave, DO [facility] Radiology Electronically Signed   By: Gilmer Mor D.O.   On: 07/28/2016 17:44   Ir US Guide Vasc Access Left  Result Date: 07/28/2016 INDICATION: 51 year old male with a history of hematuria, confirmed to be left-sided EXAM: ULTRASOUND GUIDED ACCESS LEFT RADIAL ARTERY AORTIC ANGIOGRAM LEFT RENAL ANGIOGRAM MEDICATIONS: 3000 units heparin intra- arterial, 2.5 mg verapamil, intra- arterial, 200 mcg nitroglycerin intraarterial. ANESTHESIA/SEDATION: Moderate (conscious) sedation was employed during this procedure. A total of Versed 2.0 mg and Fentanyl 100 mcg was administered intravenously. Moderate Sedation Time: 58 minutes. The patient's level of consciousness and vital signs were monitored continuously by radiology nursing throughout the procedure under my direct supervision. CONTRAST:  One hundred fifty Isovue-300 FLUOROSCOPY TIME:  Fluoroscopy Time: 6 minutes 12 seconds (1,075 mGy). COMPLICATIONS: None PROCEDURE: Informed consent was obtained from the patient following explanation of the procedure, risks, benefits and alternatives. The patient understands, agrees and consents for the procedure. All questions were addressed. A time out was performed prior to the initiation of the procedure. Maximal barrier sterile technique utilized including caps, mask, sterile gowns, sterile gloves, large sterile drape, hand hygiene, and Betadine prep. Allen test at the left hand was performed, confirming adequate perfusion of the hand by both radial and ulnar artery. Ultrasound survey of the left radial artery was performed with images stored and sent to PACs. 3 mm -4 mm diameter was confirmed. A micropuncture needle was used access the left radial artery under ultrasound. With excellent arterial blood flow returned, and an  .018 micro wire was passed through the needle, observed enter the radial artery under ultrasound. The needle was removed, and 5 [place] glide sheath slender was placed over the wire. The inner dilator and wire were removed, and radial cocktail was infused through the sheath. Benson wire was navigated to the brachial artery, over which 125 cm 5 French angled catheter was advanced. Bentson wire an the angled catheter were advanced into the descending thoracic aorta. Once the catheter was at the level of diaphragm, exchange Rosen wire was placed and the angled catheter were exchanged for a 100 cm pigtail catheter. Pigtail catheter was position at the level the diaphragm and aortic angiogram was performed. The image intensifier was then repositioned over the pelvic region and pelvic angiogram was performed. Rosen wire was  then passed through the pigtail catheter and exchanged for 125 cm angled catheter. Using a Glidewire, angled catheter was positioned in the main left renal artery. Left renal angiogram was then performed with multiple planes. Catheter and wires were removed. The left radial artery sheath was removed with placement of TR band. Patient tolerated the procedure well and remained hemodynamically stable throughout. No complications were encountered and no significant blood loss. FINDINGS: Ultrasound survey of the left radial artery demonstrates 3.3 mm artery. Allen test demonstrates adequate perfusion of the hand with both radial artery and ulnar artery compression. Aortic angiogram demonstrates patent mesenteric vessels and bilateral single renal arteries. Aortic angiogram only partially images the right kidney, given the field-of-view. There is asymmetric washout of the corticomedullary phase of the visualize right kidney versus the left. The left appears somewhat delayed. On dedicated renal angiogram of the left renal artery, there are no stenoses, occlusions, string of beads configuration. No venous  shunting identified. No parenchymal lesion is identified. The entire parenchyma is opacified from single left renal artery with no collateral flow identified. There is indistinct cortico medullary differentiation on the parenchymal phase of the angiogram, with questionable slow flow through distal vasculature. The indistinct and patchy parenchymal phase is most pronounced at the superior aspect of the left kidney. IMPRESSION: Status post aortic, pelvic, and left renal angiogram for investigation of left-sided hematuria with no arteriovenous malformation, venous shunting, or parenchymal lesion identified. There is indistinct corticomedullary perfusion of the left kidney with questionable slow flow within distal vasculature. This finding could represent underlying medical renal condition such as chronic glomerulonephritis, or polyarteritis nodosa. Signed, Yvone NeuJaime S. Loreta AveWagner, DO [facility] Electronically Signed   By: Gilmer MorJaime  Wagner D.O.   On: 07/28/2016 17:44     Assessment and plan- Patient is a 51 y.o. male with iron deficiency anemia likely due to hematuria  Significant improvement in H/H with trial of PO iron. No need for IV iron at this time. Iron studies pending from today. Patient is to continue PO iron which he is tolerating well without side effects. Repeat cbc and iron studies in 3 and 6 months and he will see me back in 6 months   Visit Diagnosis 1. Iron deficiency anemia due to chronic blood loss      Dr. Owens SharkArchana Bryndan Bilyk, MD, MPH [facility] Pager- [phone] 08/04/2016 3:20 PM

## 2016-08-04 NOTE — Progress Notes (Signed)
Patient here for follow up. He states there is no changes since last appointment.

## 2016-09-02 ENCOUNTER — Ambulatory Visit
Admission: RE | Admit: 2016-09-02 | Discharge: 2016-09-02 | Disposition: A | Discharge status: Home / Self Care | Payer: BLUE CROSS/BLUE SHIELD | Source: Ambulatory Visit | Attending: Urology | Admitting: Urology

## 2016-09-02 DIAGNOSIS — Z87442 Personal history of urinary calculi: Secondary | ICD-10-CM | POA: Diagnosis not present

## 2016-09-02 DIAGNOSIS — N281 Cyst of kidney, acquired: Secondary | ICD-10-CM | POA: Insufficient documentation

## 2016-09-02 DIAGNOSIS — N200 Calculus of kidney: Secondary | ICD-10-CM

## 2016-09-05 ENCOUNTER — Telehealth: Payer: Self-pay | Admitting: Family Medicine

## 2016-09-05 NOTE — Telephone Encounter (Signed)
PT SAID THAT THE OLD RX WAS SENT IN FOR HIS TELMISTARTAN - HYDROCHLOROTHIAZIDE 80- 12.5 MG. THE ONE THAT HE SHOULD HAVE RECEIVED IS FOR 80-25 BUT THAT IS NOT WHAT HE RECEVIED FROM THE PHARM.

## 2016-09-05 NOTE — Telephone Encounter (Signed)
I TOLD THE PATIENT THE SAME THING AND HE WAS TO CALL HIS PHARM AND I HAVE NOT HEARD ANYTHING ELSE

## 2016-09-05 NOTE — Telephone Encounter (Signed)
A rx for Telmisartan-hydrochlorothiazide (MICARDIS HCT) 80-25 MG tablet was sent in on 08/01/16 to [facility] in [place]. Please have patient check with his pharmacy.

## 2016-09-09 ENCOUNTER — Ambulatory Visit (INDEPENDENT_AMBULATORY_CARE_PROVIDER_SITE_OTHER): Payer: BLUE CROSS/BLUE SHIELD | Admitting: Urology

## 2016-09-09 ENCOUNTER — Encounter: Payer: Self-pay | Admitting: Urology

## 2016-09-09 VITALS — BP 114/70 | HR 103 | Ht 71.0 in | Wt 292.2 lb

## 2016-09-09 DIAGNOSIS — R310 Gross hematuria: Secondary | ICD-10-CM | POA: Diagnosis not present

## 2016-09-09 NOTE — Progress Notes (Signed)
09/09/2016 3:21 PM   Nelma Rothman Lorenda Peck Mar 30, 1965 [id number]  Referring provider: Ellyn Hack, MD [address] [place], [place] [address]  Chief Complaint  Patient presents with  . Follow-up    RUS results    HPI: The patient is a 51 year old gentleman that has had intermittent gross hematuria for the last 2 decades. He has has had negative hematuria workups in the past. He had CT scan with contrast without delayed phase images, so he presented to the operating room recently for cystoscopy with bilateral retrograde polygrams. These were normal. However, during the procedure, he had obvious eflux of gross hematuria from his left ureteral orifice. He underwent ureteroscopy which was normal except for a 3 mm calculus which was removed. A ureteral stent was placed. He presents today for left ureteral stent removal in the office. The stone was likely not the source of his gross hematuria, he has no previous history of nephrolithiasis.  His hematuria has caused him to pass clots as well as drop his hemoglobin. He is currently requiring iron.  He did have angiography well over a decade ago that was negative. His hematuria is gotten significantly worse since that time though.    He underwent angiography in July 2018 revealing: Status post aortic, pelvic, and left renal angiogram for investigation of left-sided hematuria with no arteriovenous malformation, venous shunting, or parenchymal lesion identified.  There is indistinct corticomedullary perfusion of the left kidney with questionable slow flow within distal vasculature. This finding could represent underlying medical renal condition such as chronic glomerulonephritis, or polyarteritis nodosa.  The interventional radiologist recommended consider nephrology consult.  Repeat renal ultrasound was unremarkable except for simple renal cysts in August 2018. No sign of hydronephrosis after instrumentation of his left  ureter.  His urinary status has however returned to baseline since her last seen. He only has intermittent gross hematuria. He is not bleeding is significantly as he was prior to this that caused his blood counts to drop. He has many medical bills pending at this time from his recent procedures, and since his baseline is back to normal he is not interested in seeing a nephrologist currently.  PMH: Past Medical History:  Diagnosis Date  . Anemia   . ED (erectile dysfunction)   . EKG abnormalities   . Hematuria   . Hematuria    x 20 yr per pt  . History of kidney stones   . Hypertension   . Obesity   . Obstructive sleep apnea   . Vitamin D deficiency     Surgical History: Past Surgical History:  Procedure Laterality Date  . ANKLE SURGERY Left 03/07/2011   fracture from trauma  . COLONOSCOPY WITH PROPOFOL N/A 07/01/2016   Procedure: COLONOSCOPY WITH PROPOFOL;  Surgeon: Wyline Mood, MD;  Location: [facility] ENDOSCOPY;  Service: Endoscopy;  Laterality: N/A;  . CYSTOSCOPY W/ RETROGRADES Bilateral 06/01/2016   Procedure: CYSTOSCOPY WITH RETROGRADE PYELOGRAM;  Surgeon: Hildred Laser, MD;  Location: [facility] ORS;  Service: Urology;  Laterality: Bilateral;  . CYSTOSCOPY WITH STENT PLACEMENT Left 06/01/2016   Procedure: CYSTOSCOPY WITH STENT PLACEMENT;  Surgeon: Hildred Laser, MD;  Location: [facility] ORS;  Service: Urology;  Laterality: Left;  . ESOPHAGOGASTRODUODENOSCOPY (EGD) WITH PROPOFOL N/A 07/01/2016   Procedure: ESOPHAGOGASTRODUODENOSCOPY (EGD) WITH PROPOFOL;  Surgeon: Wyline Mood, MD;  Location: [facility] ENDOSCOPY;  Service: Endoscopy;  Laterality: N/A;  . IR RADIOLOGIST EVAL & MGMT  06/16/2016  . IR RENAL SELECTIVE  UNI INC S&I MOD  SED  07/28/2016  . IR US GUIDE VASC ACCESS LEFT  07/28/2016  . STONE EXTRACTION WITH BASKET Left 06/01/2016   Procedure: STONE EXTRACTION WITH BASKET;  Surgeon: Hildred Laser, MD;  Location: [facility] ORS;  Service: Urology;  Laterality: Left;  . TONSILLECTOMY      . TONSILLECTOMY AND ADENOIDECTOMY  age 77  . URETEROSCOPY Left 06/01/2016   Procedure: URETEROSCOPY;  Surgeon: Hildred Laser, MD;  Location: [facility] ORS;  Service: Urology;  Laterality: Left;    Home Medications:  Allergies as of 09/09/2016   No Known Allergies     Medication List       Accurate as of 09/09/16  3:21 PM. Always use your most recent med list.          CIALIS 20 MG tablet Generic drug:  tadalafil Take 20 mg by mouth daily as needed for erectile dysfunction.   ferrous sulfate 325 (65 FE) MG EC tablet Take 325 mg by mouth 2 (two) times daily with a meal. (0600 & 1530)   multivitamin with minerals Tabs tablet Take 1 tablet by mouth daily at 6 (six) AM.   ranitidine 75 MG tablet Commonly known as:  ZANTAC Take 75 mg by mouth 2 (two) times daily as needed for heartburn.   telmisartan-hydrochlorothiazide 80-25 MG tablet Commonly known as:  MICARDIS HCT Take 1 tablet by mouth daily.       Allergies: No Known Allergies  Family History: Family History  Problem Relation Age of Onset  . Breast cancer Mother   . Hypertension Mother   . Diabetes Father   . Hypertension Father   . Hypertension Sister   . Pancreatic disease Sister   . Diabetes Sister   . Hypertension Sister   . Hypertension Brother   . Diverticulosis Brother   . Prostate cancer Maternal Uncle   . Prostate cancer Maternal Uncle   . Kidney cancer Neg Hx   . Bladder Cancer Neg Hx     Social History:  reports that he has never smoked. He has never used smokeless tobacco. He reports that he does not drink alcohol or use drugs.  ROS: UROLOGY Frequent Urination?: No Hard to postpone urination?: No Burning/pain with urination?: No Get up at night to urinate?: No Leakage of urine?: No Urine stream starts and stops?: No Trouble starting stream?: No Do you have to strain to urinate?: No Blood in urine?: Yes Urinary tract infection?: No Sexually transmitted disease?: No Injury to kidneys  or bladder?: No Painful intercourse?: No Weak stream?: No Erection problems?: No Penile pain?: No  Gastrointestinal Nausea?: No Vomiting?: No Indigestion/heartburn?: No Diarrhea?: No Constipation?: No  Constitutional Fever: No Night sweats?: No Weight loss?: No Fatigue?: No  Skin Skin rash/lesions?: No Itching?: No  Eyes Blurred vision?: No Double vision?: No  Ears/Nose/Throat Sore throat?: No Sinus problems?: No  Hematologic/Lymphatic Swollen glands?: No Easy bruising?: No  Cardiovascular Leg swelling?: No Chest pain?: No  Respiratory Cough?: No Shortness of breath?: No  Endocrine Excessive thirst?: No  Musculoskeletal Back pain?: No Joint pain?: No  Neurological Headaches?: No Dizziness?: No  Psychologic Depression?: No Anxiety?: No  Physical Exam: BP 114/70 (BP Location: Left Arm, Patient Position: Sitting, Cuff Size: Normal)   Pulse (!) 103   Ht 5\' 11"  (1.803 m)   Wt 292 lb 3.2 oz (132.5 kg)   BMI 40.75 kg/m   Constitutional:  Alert and oriented, No acute distress. HEENT: [place] AT, moist mucus membranes.  Trachea midline, no masses.  Cardiovascular: No clubbing, cyanosis, or edema. Respiratory: Normal respiratory effort, no increased work of breathing. GI: Abdomen is soft, nontender, nondistended, no abdominal masses GU: No CVA tenderness.  Skin: No rashes, bruises or suspicious lesions. Lymph: No cervical or inguinal adenopathy. Neurologic: Grossly intact, no focal deficits, moving all 4 extremities. Psychiatric: Normal mood and affect.  Laboratory Data: Lab Results  Component Value Date   WBC 7.3 08/04/2016   HGB 13.9 08/04/2016   HCT 41.1 08/04/2016   MCV 79.1 (L) 08/04/2016   PLT 220 08/04/2016    Lab Results  Component Value Date   CREATININE 0.87 07/28/2016    Lab Results  Component Value Date   PSA 0.7 03/28/2016    No results found for: TESTOSTERONE  No results found for: HGBA1C  Urinalysis    Component  Value Date/Time   APPEARANCEUR Cloudy (A) 06/09/2016 0807   GLUCOSEU Negative 06/09/2016 0807   BILIRUBINUR Negative 06/09/2016 0807   PROTEINUR 3+ (A) 06/09/2016 0807   NITRITE Negative 06/09/2016 0807   LEUKOCYTESUR Trace (A) 06/09/2016 0807    Assessment & Plan:    1. Gross hematuria localized to the left collecting system I discussed the patient and his interventional radiology findings of no AV malformation. He does have a possibility of having chronic ulnar nephritis or vasculitis. He has seen a nephrologist in the past has undergone a negative renal biopsy. I offered a repeat consultation with nephrology at this time due to this possibility and the reason for his gross hematuria. Since he is back at his baseline, is not interested in doing this as he has quit a few medical bills. If his gross hematuria returns, he will contact our office. At that time, a repeat nephrology consult would be warranted.   Return if symptoms worsen or fail to improve.  Hildred Laser, MD  [facility] [address] [place], [place] [address] [phone]

## 2016-09-11 ENCOUNTER — Encounter: Payer: Self-pay | Admitting: Hematology and Oncology

## 2016-09-28 ENCOUNTER — Ambulatory Visit: Payer: BLUE CROSS/BLUE SHIELD | Admitting: Family Medicine

## 2016-11-01 ENCOUNTER — Encounter: Payer: Self-pay | Admitting: Family Medicine

## 2016-11-01 ENCOUNTER — Ambulatory Visit (INDEPENDENT_AMBULATORY_CARE_PROVIDER_SITE_OTHER): Payer: BLUE CROSS/BLUE SHIELD | Admitting: Family Medicine

## 2016-11-01 VITALS — BP 130/78 | HR 90 | Temp 98.3°F | Resp 14 | Ht 71.0 in | Wt 293.6 lb

## 2016-11-01 DIAGNOSIS — I10 Essential (primary) hypertension: Secondary | ICD-10-CM | POA: Diagnosis not present

## 2016-11-01 DIAGNOSIS — Z23 Encounter for immunization: Secondary | ICD-10-CM

## 2016-11-01 DIAGNOSIS — K429 Umbilical hernia without obstruction or gangrene: Secondary | ICD-10-CM | POA: Diagnosis not present

## 2016-11-01 DIAGNOSIS — E7800 Pure hypercholesterolemia, unspecified: Secondary | ICD-10-CM

## 2016-11-01 LAB — LIPID PANEL
Cholesterol: 158 mg/dL (ref <200)
HDL: 52 mg/dL (ref >40)
LDL CHOLESTEROL (CALC): 89 mg/dL
NON-HDL CHOLESTEROL (CALC): 106 mg/dL (ref <130)
TRIGLYCERIDES: 79 mg/dL (ref <150)
Total CHOL/HDL Ratio: 3 (calc) (ref <5.0)

## 2016-11-01 LAB — COMPLETE METABOLIC PANEL WITH GFR
AG Ratio: 1.5 (calc) (ref 1.0–2.5)
ALKALINE PHOSPHATASE (APISO): 79 U/L (ref 40–115)
ALT: 15 U/L (ref 9–46)
AST: 13 U/L (ref 10–40)
Albumin: 4.1 g/dL (ref 3.6–5.1)
BILIRUBIN TOTAL: 0.4 mg/dL (ref 0.2–1.2)
BUN: 12 mg/dL (ref 7–25)
CHLORIDE: 101 mmol/L (ref 98–110)
CO2: 29 mmol/L (ref 20–32)
CREATININE: 0.95 mg/dL (ref 0.60–1.35)
Calcium: 9.5 mg/dL (ref 8.6–10.3)
GFR, Est African American: 108 mL/min/{1.73_m2} (ref >=60)
GFR, Est Non African American: 94 mL/min/{1.73_m2} (ref >=60)
GLUCOSE: 87 mg/dL (ref 65–99)
Globulin: 2.8 g/dL (calc) (ref 1.9–3.7)
Potassium: 3.8 mmol/L (ref 3.5–5.3)
Sodium: 139 mmol/L (ref 135–146)
TOTAL PROTEIN: 6.9 g/dL (ref 6.1–8.1)

## 2016-11-01 MED ORDER — TELMISARTAN-HCTZ 80-25 MG PO TABS
1.0000 | ORAL_TABLET | Freq: Every day | ORAL | 0 refills | Status: DC
Start: 2016-11-01 — End: 2017-01-31

## 2016-11-01 NOTE — Progress Notes (Signed)
Name: Angel Chase   MRN: [id number]    DOB: 1965/10/23   Date:11/01/2016       Progress Note  Subjective  Chief Complaint  Chief Complaint  Patient presents with  . Follow-up    6 months with fasting labs  . Medication Refill    BP MED  . Flu Vaccine    Hypertension  This is a chronic problem. The problem is unchanged. The problem is controlled. Pertinent negatives include no anxiety, blurred vision, headaches, palpitations or shortness of breath. Past treatments include angiotensin blockers and diuretics. There is no history of kidney disease, CAD/MI, CVA or heart failure.  Hyperlipidemia  This is a recurrent problem. The problem is uncontrolled. Recent lipid tests were reviewed and are high (elevated LDL). Pertinent negatives include no leg pain, myalgias or shortness of breath. He is currently on no antihyperlipidemic treatment.     Past Medical History:  Diagnosis Date  . Anemia   . ED (erectile dysfunction)   . EKG abnormalities   . Hematuria   . Hematuria    x 20 yr per pt  . History of kidney stones   . Hypertension   . Obesity   . Obstructive sleep apnea   . Vitamin D deficiency     Past Surgical History:  Procedure Laterality Date  . ANKLE SURGERY Left 03/07/2011   fracture from trauma  . COLONOSCOPY WITH PROPOFOL N/A 07/01/2016   Procedure: COLONOSCOPY WITH PROPOFOL;  Surgeon: Wyline Mood, MD;  Location: [facility] ENDOSCOPY;  Service: Endoscopy;  Laterality: N/A;  . CYSTOSCOPY W/ RETROGRADES Bilateral 06/01/2016   Procedure: CYSTOSCOPY WITH RETROGRADE PYELOGRAM;  Surgeon: Hildred Laser, MD;  Location: [facility] ORS;  Service: Urology;  Laterality: Bilateral;  . CYSTOSCOPY WITH STENT PLACEMENT Left 06/01/2016   Procedure: CYSTOSCOPY WITH STENT PLACEMENT;  Surgeon: Hildred Laser, MD;  Location: [facility] ORS;  Service: Urology;  Laterality: Left;  . ESOPHAGOGASTRODUODENOSCOPY (EGD) WITH PROPOFOL N/A 07/01/2016   Procedure: ESOPHAGOGASTRODUODENOSCOPY (EGD) WITH  PROPOFOL;  Surgeon: Wyline Mood, MD;  Location: [facility] ENDOSCOPY;  Service: Endoscopy;  Laterality: N/A;  . IR RADIOLOGIST EVAL & MGMT  06/16/2016  . IR RENAL SELECTIVE  UNI INC S&I MOD SED  07/28/2016  . IR US GUIDE VASC ACCESS LEFT  07/28/2016  . STONE EXTRACTION WITH BASKET Left 06/01/2016   Procedure: STONE EXTRACTION WITH BASKET;  Surgeon: Hildred Laser, MD;  Location: [facility] ORS;  Service: Urology;  Laterality: Left;  . TONSILLECTOMY    . TONSILLECTOMY AND ADENOIDECTOMY  age 58  . URETEROSCOPY Left 06/01/2016   Procedure: URETEROSCOPY;  Surgeon: Hildred Laser, MD;  Location: [facility] ORS;  Service: Urology;  Laterality: Left;    Family History  Problem Relation Age of Onset  . Breast cancer Mother   . Hypertension Mother   . Diabetes Father   . Hypertension Father   . Hypertension Sister   . Pancreatic disease Sister   . Diabetes Sister   . Hypertension Sister   . Hypertension Brother   . Diverticulosis Brother   . Prostate cancer Maternal Uncle   . Prostate cancer Maternal Uncle   . Kidney cancer Neg Hx   . Bladder Cancer Neg Hx     Social History   Social History  . Marital status: Married    Spouse name: Angel Chase  . Number of children: 1  . 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  Smoking status: Never Smoker  . Smokeless tobacco: Never Used  . Alcohol use No  . Drug use: No  . Sexual activity: Yes   Other Topics Concern  . Not on file   Social History Narrative  . No narrative on file     Current Outpatient Prescriptions:  .  ferrous sulfate 325 (65 FE) MG EC tablet, Take 325 mg by mouth 2 (two) times daily with a meal. (0600 & 1530), Disp: , Rfl:  .  Multiple Vitamin (MULTIVITAMIN WITH MINERALS) TABS tablet, Take 1 tablet by mouth daily at 6 (six) AM., Disp: , Rfl:  .  ranitidine (ZANTAC) 75 MG tablet, Take 75 mg by mouth 2 (two) times daily as needed for heartburn. , Disp: , Rfl:   .  telmisartan-hydrochlorothiazide (MICARDIS HCT) 80-25 MG tablet, Take 1 tablet by mouth daily., Disp: 90 tablet, Rfl: 0 .  tadalafil (CIALIS) 20 MG tablet, Take 20 mg by mouth daily as needed for erectile dysfunction. , Disp: , Rfl:   No Known Allergies   Review of Systems  Eyes: Negative for blurred vision.  Respiratory: Negative for shortness of breath.   Cardiovascular: Negative for palpitations.  Musculoskeletal: Negative for myalgias.  Neurological: Negative for headaches.     Objective  Vitals:   11/01/16 0852  BP: 130/78  Pulse: 90  Resp: 14  Temp: 98.3 F (36.8 C)  TempSrc: Oral  SpO2: 95%  Weight: 293 lb 9.6 oz (133.2 kg)  Height:  (1.803 m)    Physical Exam  Constitutional: He is oriented to person, place, and time and well-developed, well-nourished, and in no distress.  HENT:  Head: Normocephalic and atraumatic.  Cardiovascular: Normal rate, regular rhythm and normal heart sounds.   No murmur heard. Pulmonary/Chest: Effort normal and breath sounds normal. He has no wheezes.  Abdominal: Soft. Bowel sounds are normal. There is no tenderness. A hernia is present. Hernia confirmed positive in the umbilical area.  Musculoskeletal: He exhibits no edema.  Neurological: He is alert and oriented to person, place, and time.  Skin: Skin is warm and dry.  Psychiatric: Mood, memory, affect and judgment normal.  Nursing note and vitals reviewed.    Assessment & Plan  1. Essential hypertension BP stable on present antihypertensive treatment -telmisartan-hydrochlorothiazide (MICARDIS HCT) 80-25 MG tablet; Take 1 tablet by mouth daily.  Dispense: 90 tablet; Refill: 0  2. Pure hypercholesterolemia Obtain FLP,, consider starting on statin treatment - Lipid panel - COMPLETE METABOLIC PANEL WITH GFR  3. Umbilical hernia without obstruction or gangrene Easily reducible, asymptomatic, reassured  4. Needs flu shot  - Flu Vaccine QUAD 6+ mos PF IM (Fluarix  Quad PF)  Angel Chase [facility] 11/01/2016 9:05 AM

## 2016-11-03 ENCOUNTER — Inpatient Hospital Stay: Payer: BLUE CROSS/BLUE SHIELD | Attending: Oncology

## 2016-11-03 DIAGNOSIS — D509 Iron deficiency anemia, unspecified: Secondary | ICD-10-CM | POA: Insufficient documentation

## 2016-11-15 ENCOUNTER — Inpatient Hospital Stay: Payer: BLUE CROSS/BLUE SHIELD

## 2016-11-15 DIAGNOSIS — D509 Iron deficiency anemia, unspecified: Secondary | ICD-10-CM | POA: Diagnosis not present

## 2016-11-15 LAB — CBC WITH DIFFERENTIAL/PLATELET
BASOS PCT: 1 %
Basophils Absolute: 0.1 10*3/uL (ref 0–0.1)
Eosinophils Absolute: 0.1 10*3/uL (ref 0–0.7)
Eosinophils Relative: 1 %
HEMATOCRIT: 43.3 % (ref 40.0–52.0)
HEMOGLOBIN: 14.5 g/dL (ref 13.0–18.0)
Lymphocytes Relative: 21 %
Lymphs Abs: 1.6 10*3/uL (ref 1.0–3.6)
MCH: 28 pg (ref 26.0–34.0)
MCHC: 33.6 g/dL (ref 32.0–36.0)
MCV: 83.4 fL (ref 80.0–100.0)
MONOS PCT: 11 %
Monocytes Absolute: 0.8 10*3/uL (ref 0.2–1.0)
NEUTROS ABS: 5.2 10*3/uL (ref 1.4–6.5)
NEUTROS PCT: 66 %
Platelets: 212 10*3/uL (ref 150–440)
RBC: 5.19 MIL/uL (ref 4.40–5.90)
RDW: 14.4 % (ref 11.5–14.5)
WBC: 7.8 10*3/uL (ref 3.8–10.6)

## 2016-11-15 LAB — IRON AND TIBC
IRON: 68 ug/dL (ref 45–182)
Saturation Ratios: 20 % (ref 17.9–39.5)
TIBC: 333 ug/dL (ref 250–450)
UIBC: 265 ug/dL

## 2016-11-15 LAB — FERRITIN: Ferritin: 29 ng/mL (ref 24–336)

## 2016-12-02 ENCOUNTER — Other Ambulatory Visit: Payer: Self-pay | Admitting: Family Medicine

## 2016-12-02 DIAGNOSIS — I10 Essential (primary) hypertension: Secondary | ICD-10-CM

## 2016-12-05 ENCOUNTER — Telehealth: Payer: Self-pay | Admitting: Family Medicine

## 2016-12-05 NOTE — Telephone Encounter (Signed)
Michardis refill

## 2016-12-05 NOTE — Telephone Encounter (Signed)
Copied from CRM [id number]. Topic: General - Other >> Dec 05, 2016  3:25 PM Stephannie LiSimmons, Layce Sprung L, NT wrote: Reason for ZOX:[id number]:Patient wants a refill on  michardis  hct  80-25 mg sent to [facility] on [address]  , [place], patients number is [phone]

## 2016-12-06 ENCOUNTER — Encounter: Payer: BLUE CROSS/BLUE SHIELD | Admitting: Family Medicine

## 2016-12-26 ENCOUNTER — Encounter: Payer: BLUE CROSS/BLUE SHIELD | Admitting: Family Medicine

## 2017-01-04 ENCOUNTER — Encounter: Payer: BLUE CROSS/BLUE SHIELD | Admitting: Family Medicine

## 2017-01-13 ENCOUNTER — Encounter: Payer: BLUE CROSS/BLUE SHIELD | Admitting: Family Medicine

## 2017-01-31 ENCOUNTER — Encounter: Payer: Self-pay | Admitting: Family Medicine

## 2017-01-31 ENCOUNTER — Ambulatory Visit: Payer: BLUE CROSS/BLUE SHIELD | Admitting: Family Medicine

## 2017-01-31 DIAGNOSIS — I10 Essential (primary) hypertension: Secondary | ICD-10-CM

## 2017-01-31 MED ORDER — TELMISARTAN-HCTZ 80-25 MG PO TABS: 1.0000 | ORAL_TABLET | Freq: Every day | ORAL | 0 refills | Status: DC

## 2017-01-31 NOTE — Progress Notes (Signed)
Name: Angel LawrenceDaryl E Muns   MRN: [id number]    DOB: Oct 03, 1965   Date:01/31/2017       Progress Note  Subjective  Chief Complaint  Chief Complaint  Patient presents with  . Hypertension  . Medication Refill    Hypertension  This is a chronic problem. The problem is unchanged. The problem is controlled. Pertinent negatives include no blurred vision, chest pain, headaches or palpitations. Past treatments include angiotensin blockers and diuretics. There is no history of kidney disease, CAD/MI or CVA.     Past Medical History:  Diagnosis Date  . Anemia   . ED (erectile dysfunction)   . EKG abnormalities   . Hematuria   . Hematuria    x 20 yr per pt  . History of kidney stones   . Hypertension   . Obesity   . Obstructive sleep apnea   . Vitamin D deficiency     Past Surgical History:  Procedure Laterality Date  . ANKLE SURGERY Left 03/07/2011   fracture from trauma  . COLONOSCOPY WITH PROPOFOL N/A 07/01/2016   Procedure: COLONOSCOPY WITH PROPOFOL;  Surgeon: Wyline MoodAnna, [place]an, MD;  [facility];  Service: Endoscopy;  Laterality: N/A;  . CYSTOSCOPY W/ RETROGRADES Bilateral 06/01/2016   Procedure: CYSTOSCOPY WITH RETROGRADE PYELOGRAM;  Surgeon: Hildred LaserBudzyn, Brian James, MD;  Location: [facility] ORS;  Service: Urology;  Laterality: Bilateral;  . CYSTOSCOPY WITH STENT PLACEMENT Left 06/01/2016   Procedure: CYSTOSCOPY WITH STENT PLACEMENT;  Surgeon: Hildred LaserBudzyn, Brian James, MD;  Location: [facility] ORS;  Service: Urology;  Laterality: Left;  . ESOPHAGOGASTRODUODENOSCOPY (EGD) WITH PROPOFOL N/A 07/01/2016   Procedure: ESOPHAGOGASTRODUODENOSCOPY (EGD) WITH PROPOFOL;  Surgeon: Wyline MoodAnna, Kiran, MD;  [facility];  Service: Endoscopy;  Laterality: N/A;  . IR RADIOLOGIST EVAL & MGMT  06/16/2016  . IR RENAL SELECTIVE  UNI INC S&I MOD SED  07/28/2016  . IR US GUIDE VASC ACCESS LEFT  07/28/2016  . STONE EXTRACTION WITH BASKET Left 06/01/2016   Procedure: STONE EXTRACTION WITH BASKET;  Surgeon: Hildred LaserBudzyn, Brian  James, MD;  Location: [facility] ORS;  Service: Urology;  Laterality: Left;  . TONSILLECTOMY    . TONSILLECTOMY AND ADENOIDECTOMY  age 52  . URETEROSCOPY Left 06/01/2016   Procedure: URETEROSCOPY;  Surgeon: Hildred LaserBudzyn, Brian James, MD;  Location: [facility] ORS;  Service: Urology;  Laterality: Left;    Family History  Problem Relation Age of Onset  . Breast cancer Mother   . Hypertension Mother   . Diabetes Father   . Hypertension Father   . Hypertension Sister   . Pancreatic disease Sister   . Diabetes Sister   . Hypertension Sister   . Hypertension Brother   . Diverticulosis Brother   . Prostate cancer Maternal Uncle   . Prostate cancer Maternal Uncle   . Kidney cancer Neg Hx   . Bladder Cancer Neg Hx     Social History   Socioeconomic History  . Marital status: Married    Spouse name: Sherrie  . Number of children: 1  . Years of education: Not on file  . Highest education level: Not on file  Social Needs  . Financial resource strain: Not on file  . Food insecurity - worry: Not on file  . Food insecurity - inability: Not on file  . Transportation needs - medical: Not on file  . Transportation needs - non-medical: Not on file  Occupational History  . Occupation: Education officer, [employer]  Tobacco Use  . Smoking status: Never Smoker  . Smokeless tobacco:  Never Used  Substance and Sexual Activity  . Alcohol use: No    Alcohol/week: 0.0 oz  . Drug use: No  . Sexual activity: Yes  Other Topics Concern  . Not on file  Social History Narrative  . Not on file     Current Outpatient Medications:  .  ferrous sulfate 325 (65 FE) MG EC tablet, Take 325 mg by mouth 2 (two) times daily with a meal. (0600 & 1530), Disp: , Rfl:  .  Multiple Vitamin (MULTIVITAMIN WITH MINERALS) TABS tablet, Take 1 tablet by mouth daily at 6 (six) AM., Disp: , Rfl:  .  ranitidine (ZANTAC) 75 MG tablet, Take 75 mg by mouth 2 (two) times daily as needed for heartburn. , Disp: , Rfl:  .  tadalafil (CIALIS) 20  MG tablet, Take 20 mg by mouth daily as needed for erectile dysfunction. , Disp: , Rfl:  .  telmisartan-hydrochlorothiazide (MICARDIS HCT) 80-25 MG tablet, Take 1 tablet by mouth daily., Disp: 90 tablet, Rfl: 0  No Known Allergies   Review of Systems  Eyes: Negative for blurred vision.  Cardiovascular: Negative for chest pain and palpitations.  Neurological: Negative for headaches.      Objective  Vitals:   01/31/17 1131  BP: 124/68  Pulse: 89  Resp: 14  Temp: 98.2 F (36.8 C)  TempSrc: Oral  SpO2: 93%  Weight: 283 lb 3.2 oz (128.5 kg)  Height: 5\' 11"  (1.803 m)    Physical Exam  Constitutional: He is oriented to person, place, and time and well-developed, well-nourished, and in no distress.  Cardiovascular: Normal rate, regular rhythm and normal heart sounds.  No murmur heard. Pulmonary/Chest: Effort normal and breath sounds normal. He has no wheezes.  Musculoskeletal: He exhibits no edema.  Neurological: He is alert and oriented to person, place, and time.  Psychiatric: Mood, memory, affect and judgment normal.  Nursing note and vitals reviewed.       Assessment & Plan  1. Essential hypertension BP stable on present antihypertensive treatment - telmisartan-hydrochlorothiazide (MICARDIS HCT) 80-25 MG tablet; Take 1 tablet by mouth daily.  Dispense: 90 tablet; Refill: 0   Arryn Terrones Asad A[facility] 01/31/2017 11:36 AM

## 2017-02-02 ENCOUNTER — Inpatient Hospital Stay: Payer: BLUE CROSS/BLUE SHIELD

## 2017-02-02 ENCOUNTER — Inpatient Hospital Stay: Payer: BLUE CROSS/BLUE SHIELD | Attending: Oncology | Admitting: Oncology

## 2017-02-02 ENCOUNTER — Other Ambulatory Visit: Payer: Self-pay

## 2017-02-02 ENCOUNTER — Encounter: Payer: Self-pay | Admitting: Oncology

## 2017-02-02 VITALS — BP 130/81 | HR 79 | Temp 97.8°F | Resp 16 | Wt 300.0 lb

## 2017-02-02 DIAGNOSIS — Z79899 Other long term (current) drug therapy: Secondary | ICD-10-CM | POA: Diagnosis not present

## 2017-02-02 DIAGNOSIS — E559 Vitamin D deficiency, unspecified: Secondary | ICD-10-CM | POA: Diagnosis not present

## 2017-02-02 DIAGNOSIS — K5790 Diverticulosis of intestine, part unspecified, without perforation or abscess without bleeding: Secondary | ICD-10-CM | POA: Insufficient documentation

## 2017-02-02 DIAGNOSIS — Z8042 Family history of malignant neoplasm of prostate: Secondary | ICD-10-CM | POA: Diagnosis not present

## 2017-02-02 DIAGNOSIS — Z87442 Personal history of urinary calculi: Secondary | ICD-10-CM | POA: Diagnosis not present

## 2017-02-02 DIAGNOSIS — G4733 Obstructive sleep apnea (adult) (pediatric): Secondary | ICD-10-CM | POA: Insufficient documentation

## 2017-02-02 DIAGNOSIS — I10 Essential (primary) hypertension: Secondary | ICD-10-CM | POA: Diagnosis not present

## 2017-02-02 DIAGNOSIS — D500 Iron deficiency anemia secondary to blood loss (chronic): Secondary | ICD-10-CM | POA: Insufficient documentation

## 2017-02-02 DIAGNOSIS — R319 Hematuria, unspecified: Secondary | ICD-10-CM | POA: Diagnosis not present

## 2017-02-02 DIAGNOSIS — Z803 Family history of malignant neoplasm of breast: Secondary | ICD-10-CM | POA: Insufficient documentation

## 2017-02-02 LAB — CBC WITH DIFFERENTIAL/PLATELET
BASOS ABS: 0.1 10*3/uL (ref 0–0.1)
Basophils Relative: 1 %
Eosinophils Absolute: 0.1 10*3/uL (ref 0–0.7)
Eosinophils Relative: 1 %
HEMATOCRIT: 44.9 % (ref 40.0–52.0)
HEMOGLOBIN: 15 g/dL (ref 13.0–18.0)
LYMPHS ABS: 1.5 10*3/uL (ref 1.0–3.6)
LYMPHS PCT: 18 %
MCH: 28.4 pg (ref 26.0–34.0)
MCHC: 33.5 g/dL (ref 32.0–36.0)
MCV: 84.8 fL (ref 80.0–100.0)
Monocytes Absolute: 0.9 10*3/uL (ref 0.2–1.0)
Monocytes Relative: 11 %
NEUTROS ABS: 5.5 10*3/uL (ref 1.4–6.5)
Neutrophils Relative %: 69 %
Platelets: 191 10*3/uL (ref 150–440)
RBC: 5.3 MIL/uL (ref 4.40–5.90)
RDW: 14.3 % (ref 11.5–14.5)
WBC: 8 10*3/uL (ref 3.8–10.6)

## 2017-02-02 LAB — FERRITIN: FERRITIN: 39 ng/mL (ref 24–336)

## 2017-02-02 LAB — IRON AND TIBC
Iron: 45 ug/dL (ref 45–182)
Saturation Ratios: 15 % — ABNORMAL LOW (ref 17.9–39.5)
TIBC: 311 ug/dL (ref 250–450)
UIBC: 266 ug/dL

## 2017-02-02 NOTE — Progress Notes (Signed)
Contacted patient and advised that Flex Sig was overdue.   Patient scheduled procedure.   Mailed prep instructions.

## 2017-02-02 NOTE — Progress Notes (Signed)
Hematology/Oncology Consult note [facility]  [phone] [phone]  Patient Care Team: Ellyn Hack, MD as PCP - General (Family Medicine)   Name of the patient: Angel Chase  [id number]  [birth year]   Date of visit: 02/02/17  Diagnosis- iron deficiency anemia likely due to hematuria  Chief complaint/ Reason for visit- routine f/u  Heme/Onc history: Patient is a 52 year old male who has a long standing history of hematuria. He has been seen by both nephrology and urology in the past to evaluate his hematuria and states he underwent extensive testing but no cause for hematuria was found. He has persistent hematuria all along sometimes tea colored sometimes bright red but was never anemic in the past. No family h/o bleeding disorders. No gum bleeds, nose bleeds or bleeding in his stool. He has had ankle surgery in the past without any bleeding issues.  He has been referred to [place] for evaluation and management of anemia. Most recent CBC from 04/28/2016 showed white count of 6.8, H&H of 10.9/35.9 with an MCV of 72.5 and a platelet count of 295. Iron studies revealed a low serum iron of 18 and low iron saturation of 4%. TIBC was high normal at 407. Ferritin was low at 6. Patient is yet to see urology for hematuria. He has also been referred to [facility] h/o diverticulitis. He has never tried oral iron  Also seen by Dr. Tobi Bastos from GI and underwent EGD and colonoscopy which was unremarkable  He has not received IV iron so far and remains on oral iron   Interval history-the patient continues to have occasional on and off hematuria.  Last seen by Dr. Sherryl Barters from urology in August 2018.  He was not found to have any AV malformation.  Possibility of ulnar nephritis or vasculitis was raised.  Patient had seen a nephrologist in the past with a negative renal biopsy and did not wish to go back to nephrology.  He will be following up with urology only  on an as-needed basis.  He denies any blood in his  stool or dark melanotic stools.  He is taking iron pill once a day and tolerating it well without any difficulties  ECOG PS- 0 Pain scale- 0   Review of systems- Review of Systems  Constitutional: Negative for chills, fever, malaise/fatigue and weight loss.  HENT: Negative for congestion, ear discharge and nosebleeds.   Eyes: Negative for blurred vision.  Respiratory: Negative for cough, hemoptysis, sputum production, shortness of breath and wheezing.   Cardiovascular: Negative for chest pain, palpitations, orthopnea and claudication.  Gastrointestinal: Negative for abdominal pain, blood in stool, constipation, diarrhea, heartburn, melena, nausea and vomiting.  Genitourinary: Negative for dysuria, flank pain, frequency, hematuria and urgency.  Musculoskeletal: Negative for back pain, joint pain and myalgias.  Skin: Negative for rash.  Neurological: Negative for dizziness, tingling, focal weakness, seizures, weakness and headaches.  Endo/Heme/Allergies: Does not bruise/bleed easily.  Psychiatric/Behavioral: Negative for depression and suicidal ideas. The patient does not have insomnia.      No Known Allergies   Past Medical History:  Diagnosis Date  . Anemia   . ED (erectile dysfunction)   . EKG abnormalities   . Hematuria   . Hematuria    x 20 yr per pt  . History of kidney stones   . Hypertension   . Obesity   . Obstructive sleep apnea   . Vitamin D deficiency      Past Surgical History:  Procedure Laterality Date  . ANKLE SURGERY Left 03/07/2011   fracture from trauma  . COLONOSCOPY WITH PROPOFOL N/A 07/01/2016   Procedure: COLONOSCOPY WITH PROPOFOL;  Surgeon: Wyline MoodAnna, Kiran, MD;  [facility];  Service: Endoscopy;  Laterality: N/A;  . CYSTOSCOPY W/ RETROGRADES Bilateral 06/01/2016   Procedure: CYSTOSCOPY WITH RETROGRADE PYELOGRAM;  Surgeon: Hildred LaserBudzyn, Brian James, MD;  Location: [facility] ORS;  Service: Urology;   Laterality: Bilateral;  . CYSTOSCOPY WITH STENT PLACEMENT Left 06/01/2016   Procedure: CYSTOSCOPY WITH STENT PLACEMENT;  Surgeon: Hildred LaserBudzyn, Brian James, MD;  Location: [facility] ORS;  Service: Urology;  Laterality: Left;  . ESOPHAGOGASTRODUODENOSCOPY (EGD) WITH PROPOFOL N/A 07/01/2016   Procedure: ESOPHAGOGASTRODUODENOSCOPY (EGD) WITH PROPOFOL;  Surgeon: Wyline MoodAnna, Kiran, MD;  [facility];  Service: Endoscopy;  Laterality: N/A;  . IR RADIOLOGIST EVAL & MGMT  06/16/2016  . IR RENAL SELECTIVE  UNI INC S&I MOD SED  07/28/2016  . IR US GUIDE VASC ACCESS LEFT  07/28/2016  . STONE EXTRACTION WITH BASKET Left 06/01/2016   Procedure: STONE EXTRACTION WITH BASKET;  Surgeon: Hildred LaserBudzyn, Brian James, MD;  Location: [facility] ORS;  Service: Urology;  Laterality: Left;  . TONSILLECTOMY    . TONSILLECTOMY AND ADENOIDECTOMY  age 89  . URETEROSCOPY Left 06/01/2016   Procedure: URETEROSCOPY;  Surgeon: Hildred LaserBudzyn, Brian James, MD;  Location: [facility] ORS;  Service: Urology;  Laterality: Left;    Social History   Socioeconomic History  . Marital status: Married    Spouse name: Sherrie  . Number of children: 1  . Years of education: Not on file  . Highest education level: Not on file  Social Needs  . Financial resource strain: Not on file  . Food insecurity - worry: Not on file  . Food insecurity - inability: Not on file  . Transportation needs - medical: Not on file  . Transportation needs - non-medical: Not on file  Occupational History  . Occupation: Education officer, [employer]  Tobacco Use  . Smoking status: Never Smoker  . Smokeless tobacco: Never Used  Substance and Sexual Activity  . Alcohol use: No    Alcohol/week: 0.0 oz  . Drug use: No  . Sexual activity: Yes  Other Topics Concern  . Not on file  Social History Narrative  . Not on file    Family History  Problem Relation Age of Onset  . Breast cancer Mother   . Hypertension Mother   . Diabetes Father   . Hypertension Father   . Hypertension Sister     . Pancreatic disease Sister   . Diabetes Sister   . Hypertension Sister   . Hypertension Brother   . Diverticulosis Brother   . Prostate cancer Maternal Uncle   . Prostate cancer Maternal Uncle   . Kidney cancer Neg Hx   . Bladder Cancer Neg Hx      Current Outpatient Medications:  .  ferrous sulfate 325 (65 FE) MG EC tablet, Take 325 mg by mouth 2 (two) times daily with a meal. (0600 & 1530), Disp: , Rfl:  .  Multiple Vitamin (MULTIVITAMIN WITH MINERALS) TABS tablet, Take 1 tablet by mouth daily at 6 (six) AM., Disp: , Rfl:  .  ranitidine (ZANTAC) 75 MG tablet, Take 75 mg by mouth 2 (two) times daily as needed for heartburn. , Disp: , Rfl:  .  tadalafil (CIALIS) 20 MG tablet, Take 20 mg by mouth daily as needed for erectile dysfunction. , Disp: , Rfl:  .  telmisartan-hydrochlorothiazide (MICARDIS HCT) 80-25 MG  tablet, Take 1 tablet by mouth daily., Disp: 90 tablet, Rfl: 0  Physical exam:  Vitals:   02/02/17 0939  BP: 130/81  Pulse: 79  Resp: 16  Temp: 97.8 F (36.6 C)  TempSrc: Tympanic  Weight: 300 lb (136.1 kg)   Physical Exam  Constitutional: He is oriented to person, place, and time and well-developed, well-nourished, and in no distress.  HENT:  Head: Normocephalic and atraumatic.  Eyes: EOM are normal. Pupils are equal, round, and reactive to light.  Neck: Normal range of motion.  Cardiovascular: Normal rate, regular rhythm and normal heart sounds.  Pulmonary/Chest: Effort normal and breath sounds normal.  Abdominal: Soft. Bowel sounds are normal.  Neurological: He is alert and oriented to person, place, and time.  Skin: Skin is warm and dry.     CMP Latest Ref Rng & Units 11/01/2016  Glucose 65 - 99 mg/dL 87  BUN 7 - 25 mg/dL 12  Creatinine 8.11 - 9.14 mg/dL 7.82  Sodium 956 - 213 mmol/L 139  Potassium 3.5 - 5.3 mmol/L 3.8  Chloride 98 - 110 mmol/L 101  CO2 20 - 32 mmol/L 29  Calcium 8.6 - 10.3 mg/dL 9.5  Total Protein 6.1 - 8.1 g/dL 6.9  Total Bilirubin  0.2 - 1.2 mg/dL 0.4  Alkaline Phos 40 - 115 U/L -  AST 10 - 40 U/L 13  ALT 9 - 46 U/L 15   CBC Latest Ref Rng & Units 02/02/2017  WBC 3.8 - 10.6 K/uL 8.0  Hemoglobin 13.0 - 18.0 g/dL 08.6  Hematocrit 57.8 - 52.0 % 44.9  Platelets 150 - 440 K/uL 191      Assessment and plan- Patient is a 52 y.o. male with iron deficiency anemia secondary to hematuria  Patient has had an EGD and colonoscopy in June 2018 which did not reveal any evidence of bleeding.  There was a bit of an abnormality noted in the area of the rectum and Dr. Tobi Bastos had suggested a flexible sigmoidoscopy in 3-4 months.  I will touch base with him regarding that.  Patient has had on and off hematuria for a long time now and that probably is the cause of his iron deficiency anemia.  Today the patient is not anemic.  Iron studies are pending.  He does not require IV iron at this time.  Repeat CBC ferritin and iron studies in 4 months and 8 months and I will see him back in 8 months    Visit Diagnosis 1. Iron deficiency anemia due to chronic blood loss      Dr. Owens Shark, MD, MPH [facility] Pager- [phone] 02/02/2017 9:49 AM

## 2017-02-24 ENCOUNTER — Encounter
Admission: RE | Disposition: A | Discharge status: Home / Self Care | Payer: Self-pay | Source: Ambulatory Visit | Attending: Gastroenterology

## 2017-02-24 ENCOUNTER — Ambulatory Visit
Admission: RE | Admit: 2017-02-24 | Discharge: 2017-02-24 | Disposition: A | Discharge status: Home / Self Care | Payer: BLUE CROSS/BLUE SHIELD | Source: Ambulatory Visit | Attending: Gastroenterology | Admitting: Gastroenterology

## 2017-02-24 ENCOUNTER — Other Ambulatory Visit: Payer: Self-pay

## 2017-02-24 DIAGNOSIS — D500 Iron deficiency anemia secondary to blood loss (chronic): Secondary | ICD-10-CM

## 2017-02-24 DIAGNOSIS — Z6841 Body Mass Index (BMI) 40.0 and over, adult: Secondary | ICD-10-CM | POA: Diagnosis not present

## 2017-02-24 DIAGNOSIS — Z87442 Personal history of urinary calculi: Secondary | ICD-10-CM | POA: Diagnosis not present

## 2017-02-24 DIAGNOSIS — K6289 Other specified diseases of anus and rectum: Secondary | ICD-10-CM | POA: Diagnosis not present

## 2017-02-24 DIAGNOSIS — I10 Essential (primary) hypertension: Secondary | ICD-10-CM | POA: Insufficient documentation

## 2017-02-24 DIAGNOSIS — Z09 Encounter for follow-up examination after completed treatment for conditions other than malignant neoplasm: Secondary | ICD-10-CM

## 2017-02-24 DIAGNOSIS — E559 Vitamin D deficiency, unspecified: Secondary | ICD-10-CM | POA: Insufficient documentation

## 2017-02-24 DIAGNOSIS — K629 Disease of anus and rectum, unspecified: Secondary | ICD-10-CM | POA: Diagnosis not present

## 2017-02-24 DIAGNOSIS — Z79899 Other long term (current) drug therapy: Secondary | ICD-10-CM | POA: Diagnosis not present

## 2017-02-24 DIAGNOSIS — D509 Iron deficiency anemia, unspecified: Secondary | ICD-10-CM | POA: Insufficient documentation

## 2017-02-24 DIAGNOSIS — K623 Rectal prolapse: Secondary | ICD-10-CM | POA: Insufficient documentation

## 2017-02-24 DIAGNOSIS — G4733 Obstructive sleep apnea (adult) (pediatric): Secondary | ICD-10-CM | POA: Diagnosis not present

## 2017-02-24 HISTORY — PX: FLEXIBLE SIGMOIDOSCOPY: SHX5431

## 2017-02-24 SURGERY — SIGMOIDOSCOPY, FLEXIBLE
Anesthesia: General

## 2017-02-24 MED ORDER — LIDOCAINE HCL (PF) 1 % IJ SOLN: 2.0000 mL | Freq: Once | INTRAMUSCULAR | Status: DC

## 2017-02-24 MED ORDER — SODIUM CHLORIDE 0.9 % IV SOLN
INTRAVENOUS | Status: DC
  Administered 2017-02-24: 11:00:00 via INTRAVENOUS

## 2017-02-24 MED ORDER — LIDOCAINE HCL 2 % EX GEL
CUTANEOUS | Status: AC
  Filled 2017-02-24: qty 5

## 2017-02-24 NOTE — H&P (Signed)
Wyline Mood, MD [address], [place], [place], [address] [address], [place], [place], [address] Phone: [phone]  Fax: [phone]  Primary Care Physician:  Ellyn Hack, MD   Pre-Procedure History & Physical: HPI:  Angel Chase is a 52 y.o. male is here for aflexible sigmoidoscopy    Past Medical History:  Diagnosis Date  . Anemia   . ED (erectile dysfunction)   . EKG abnormalities   . Hematuria   . Hematuria    x 20 yr per pt  . History of kidney stones   . Hypertension   . Obesity   . Obstructive sleep apnea   . Vitamin D deficiency     Past Surgical History:  Procedure Laterality Date  . ANKLE SURGERY Left 03/07/2011   fracture from trauma  . COLONOSCOPY WITH PROPOFOL N/A 07/01/2016   Procedure: COLONOSCOPY WITH PROPOFOL;  Surgeon: Wyline Mood, MD;  Location: [facility] ENDOSCOPY;  Service: Endoscopy;  Laterality: N/A;  . CYSTOSCOPY W/ RETROGRADES Bilateral 06/01/2016   Procedure: CYSTOSCOPY WITH RETROGRADE PYELOGRAM;  Surgeon: Hildred Laser, MD;  Location: [facility] ORS;  Service: Urology;  Laterality: Bilateral;  . CYSTOSCOPY WITH STENT PLACEMENT Left 06/01/2016   Procedure: CYSTOSCOPY WITH STENT PLACEMENT;  Surgeon: Hildred Laser, MD;  Location: [facility] ORS;  Service: Urology;  Laterality: Left;  . ESOPHAGOGASTRODUODENOSCOPY (EGD) WITH PROPOFOL N/A 07/01/2016   Procedure: ESOPHAGOGASTRODUODENOSCOPY (EGD) WITH PROPOFOL;  Surgeon: Wyline Mood, MD;  Location: [facility] ENDOSCOPY;  Service: Endoscopy;  Laterality: N/A;  . IR RADIOLOGIST EVAL & MGMT  06/16/2016  . IR RENAL SELECTIVE  UNI INC S&I MOD SED  07/28/2016  . IR US GUIDE VASC ACCESS LEFT  07/28/2016  . STONE EXTRACTION WITH BASKET Left 06/01/2016   Procedure: STONE EXTRACTION WITH BASKET;  Surgeon: Hildred Laser, MD;  Location: [facility] ORS;  Service: Urology;  Laterality: Left;  . TONSILLECTOMY    . TONSILLECTOMY AND ADENOIDECTOMY  age 89  . URETEROSCOPY Left 06/01/2016   Procedure: URETEROSCOPY;  Surgeon: Hildred Laser, MD;  Location: [facility] ORS;  Service: Urology;  Laterality: Left;    Prior to Admission medications   Medication Sig Start Date End Date Taking? Authorizing Provider  ferrous sulfate 325 (65 FE) MG EC tablet Take 325 mg by mouth 2 (two) times daily with a meal. (0600 & 1530)    [provider]  Multiple Vitamin (MULTIVITAMIN WITH MINERALS) TABS tablet Take 1 tablet by mouth daily at 6 (six) AM.    [provider]  ranitidine (ZANTAC) 75 MG tablet Take 75 mg by mouth 2 (two) times daily as needed for heartburn.     [provider]  tadalafil (CIALIS) 20 MG tablet Take 20 mg by mouth daily as needed for erectile dysfunction.     [provider]  telmisartan-hydrochlorothiazide (MICARDIS HCT) 80-25 MG tablet Take 1 tablet by mouth daily. 01/31/17   Ellyn Hack, MD    Allergies as of 02/02/2017  . (No Known Allergies)    Family History  Problem Relation Age of Onset  . Breast cancer Mother   . Hypertension Mother   . Diabetes Father   . Hypertension Father   . Hypertension Sister   . Pancreatic disease Sister   . Diabetes Sister   . Hypertension Sister   . Hypertension Brother   . Diverticulosis Brother   . Prostate cancer Maternal Uncle   . Prostate cancer Maternal Uncle   .  Kidney cancer Neg Hx   . Bladder Cancer Neg Hx     Social History   Socioeconomic History  . Marital status: Married    Spouse name: Sherrie  . Number of children: 1  . Years of education: Not on file  . Highest education level: Not on file  Social Needs  . Financial resource strain: Not on file  . Food insecurity - worry: Not on file  . Food insecurity - inability: Not on file  . Transportation needs - medical: Not on file  . Transportation needs - non-medical: Not on file  Occupational History  . Occupation: Education officer, [employer]  Tobacco Use  . Smoking status: Never Smoker  . Smokeless tobacco:  Never Used  Substance and Sexual Activity  . Alcohol use: No    Alcohol/week: 0.0 oz  . Drug use: No  . Sexual activity: Yes  Other Topics Concern  . Not on file  Social History Narrative  . Not on file    Review of Systems: See HPI, otherwise negative ROS  Physical Exam: BP (!) 148/71   Pulse 83   Temp (!) 96 F (35.6 C) (Tympanic)   Resp 17   Ht 5\' 11"  (1.803 m)   Wt 288 lb (130.6 kg)   SpO2 98%   BMI 40.17 kg/m  General:   Alert,  pleasant and cooperative in NAD Head:  Normocephalic and atraumatic. Neck:  Supple; no masses or thyromegaly. Lungs:  Clear throughout to auscultation, normal respiratory effort.    Heart:  +S1, +S2, Regular rate and rhythm, No edema. Abdomen:  Soft, nontender and nondistended. Normal bowel sounds, without guarding, and without rebound.   Neurologic:  Alert and  oriented x4;  grossly normal neurologically.  Impression/Plan: Angel Chase is here for an colonoscopy to be performed for flexible sigmoidoscopy to evaluate abnormal area in distal rectum Risks, benefits, limitations, and alternatives regarding  colonoscopy have been reviewed with the patient.  Questions have been answered.  All parties agreeable.   Wyline MoodKiran Teran Knittle, MD  02/24/2017, 11:20 AM

## 2017-02-24 NOTE — Op Note (Signed)
[facility] Gastroenterology Patient Name: Angel HecklerDaryl Lemire Procedure Date: 02/24/2017 11:52 AM MRN: [id number] Account #: [id number] Date of Birth: 01-Sep-1965 Admit Type: Outpatient Age: 52 [facility] 3 Gender: Male Note Status: Finalized Procedure:            Flexible Sigmoidoscopy Indications:          Follow-up endoscopy after surgery Providers:            Wyline MoodKiran Estevan Kersh MD, MD Referring MD:         Shelbie AmmonsSyed A. Sherryll BurgerShah (Referring MD) Medicines:            No sedation medications were administered Complications:        No immediate complications. Procedure:            Pre-Anesthesia Assessment:                       - Prior to the procedure, a History and Physical was                        performed, and patient medications, allergies and                        sensitivities were reviewed. The patient's tolerance of                        previous anesthesia was reviewed.                       - The risks and benefits of the procedure and the                        sedation options and risks were discussed with the                        patient. All questions were answered and informed                        consent was obtained.                       - ASA Grade Assessment: II - A patient with mild                        systemic disease.                       After obtaining informed consent, the scope was passed                        under direct vision. The Endoscope was introduced                        through the anus and advanced to the the sigmoid colon.                        The flexible sigmoidoscopy was accomplished with ease.                        The patient tolerated the procedure well. The quality  of the bowel preparation was adequate. Findings:      The perianal and digital rectal examinations were normal.      A [Extent] area of thickened mucosa was found in the distal rectum over       a fold. Biopsies were taken with a  cold forceps for histology.      The exam was otherwise without abnormality. Impression:           - Congested and erythematous mucosa in the distal                        rectum. Biopsied.                       - The examination was otherwise normal. Recommendation:       - Discharge patient to home (with escort). Procedure Code(s):    --- Professional ---                       [phone], Sigmoidoscopy, flexible; with biopsy, single or                        multiple Diagnosis Code(s):    --- Professional ---                       K62.89, Other specified diseases of anus and rectum                       Z09, Encounter for follow-up examination after                        completed treatment for conditions other than malignant                        neoplasm CPT copyright 2016 [employer] Medical Association. All rights reserved. The codes documented in this report are preliminary and upon coder review may  be revised to meet current compliance requirements. Wyline Mood, MD Wyline Mood MD, MD 02/24/2017 12:06:25 PM This report has been signed electronically. Number of Addenda: 0 Note Initiated On: 02/24/2017 11:52 AM Total Procedure Duration: 0 hours 6 minutes 42 seconds       [facility]

## 2017-02-24 NOTE — OR Nursing (Signed)
Patient did not receive any sedation during the procedure.  Dr. Tobi BastosAnna come into discuss procedure results with patient and his sister.

## 2017-02-25 ENCOUNTER — Encounter: Payer: Self-pay | Admitting: Gastroenterology

## 2017-02-27 LAB — SURGICAL PATHOLOGY

## 2017-03-14 ENCOUNTER — Telehealth: Payer: Self-pay

## 2017-03-14 NOTE — Telephone Encounter (Signed)
Patient was advised of results per Dr. Tobi BastosAnna.    - put in a telephone encounter that he wishes no capsule study at this time and cc to referring doctor and to send him back if he wants one in the future

## 2017-05-17 ENCOUNTER — Telehealth: Payer: Self-pay | Admitting: Gastroenterology

## 2017-05-17 ENCOUNTER — Ambulatory Visit: Payer: BLUE CROSS/BLUE SHIELD | Admitting: Family Medicine

## 2017-05-17 ENCOUNTER — Telehealth: Payer: Self-pay | Admitting: Family Medicine

## 2017-05-17 ENCOUNTER — Encounter: Payer: Self-pay | Admitting: Emergency Medicine

## 2017-05-17 ENCOUNTER — Encounter: Payer: Self-pay | Admitting: Family Medicine

## 2017-05-17 VITALS — BP 128/74 | HR 92 | Temp 97.5°F | Resp 16 | Ht 72.0 in | Wt 287.3 lb

## 2017-05-17 DIAGNOSIS — E7800 Pure hypercholesterolemia, unspecified: Secondary | ICD-10-CM | POA: Diagnosis not present

## 2017-05-17 DIAGNOSIS — R310 Gross hematuria: Secondary | ICD-10-CM

## 2017-05-17 DIAGNOSIS — K429 Umbilical hernia without obstruction or gangrene: Secondary | ICD-10-CM | POA: Diagnosis not present

## 2017-05-17 DIAGNOSIS — Z0001 Encounter for general adult medical examination with abnormal findings: Secondary | ICD-10-CM | POA: Diagnosis not present

## 2017-05-17 DIAGNOSIS — I10 Essential (primary) hypertension: Secondary | ICD-10-CM

## 2017-05-17 DIAGNOSIS — E559 Vitamin D deficiency, unspecified: Secondary | ICD-10-CM

## 2017-05-17 DIAGNOSIS — Z125 Encounter for screening for malignant neoplasm of prostate: Secondary | ICD-10-CM | POA: Diagnosis not present

## 2017-05-17 DIAGNOSIS — Z87442 Personal history of urinary calculi: Secondary | ICD-10-CM | POA: Insufficient documentation

## 2017-05-17 DIAGNOSIS — D500 Iron deficiency anemia secondary to blood loss (chronic): Secondary | ICD-10-CM | POA: Diagnosis not present

## 2017-05-17 DIAGNOSIS — Z0000 Encounter for general adult medical examination without abnormal findings: Secondary | ICD-10-CM

## 2017-05-17 LAB — COMPLETE METABOLIC PANEL WITH GFR
AG Ratio: 1.8 (calc) (ref 1.0–2.5)
ALBUMIN MSPROF: 4.4 g/dL (ref 3.6–5.1)
ALT: 15 U/L (ref 9–46)
AST: 14 U/L (ref 10–40)
Alkaline phosphatase (APISO): 79 U/L (ref 40–115)
BUN: 10 mg/dL (ref 7–25)
CALCIUM: 9.8 mg/dL (ref 8.6–10.3)
CO2: 29 mmol/L (ref 20–32)
CREATININE: 0.92 mg/dL (ref 0.60–1.35)
Chloride: 101 mmol/L (ref 98–110)
GFR, EST NON AFRICAN AMERICAN: 97 mL/min/{1.73_m2} (ref >=60)
GFR, Est African American: 113 mL/min/{1.73_m2} (ref >=60)
GLOBULIN: 2.5 g/dL (ref 1.9–3.7)
GLUCOSE: 89 mg/dL (ref 65–99)
Potassium: 4 mmol/L (ref 3.5–5.3)
SODIUM: 138 mmol/L (ref 135–146)
TOTAL PROTEIN: 6.9 g/dL (ref 6.1–8.1)
Total Bilirubin: 0.8 mg/dL (ref 0.2–1.2)

## 2017-05-17 LAB — PSA: PSA: 0.5 ng/mL (ref <=4.0)

## 2017-05-17 LAB — LIPID PANEL
CHOLESTEROL: 189 mg/dL (ref <200)
HDL: 49 mg/dL (ref >40)
LDL Cholesterol (Calc): 122 mg/dL (calc) — ABNORMAL HIGH
Non-HDL Cholesterol (Calc): 140 mg/dL (calc) — ABNORMAL HIGH (ref <130)
Total CHOL/HDL Ratio: 3.9 (calc) (ref <5.0)
Triglycerides: 79 mg/dL (ref <150)

## 2017-05-17 LAB — TSH: TSH: 1.13 mIU/L (ref 0.40–4.50)

## 2017-05-17 NOTE — Progress Notes (Signed)
Name: Angel Chase   MRN: [id number]    DOB: 08-13-1965   Date:05/17/2017       Progress Note  Subjective  Chief Complaint  Chief Complaint  Patient presents with  . Annual Exam  . Follow-up    HPI  Patient presents for annual CPE and follow up  Hematuria: long history, last year seen by Urologist , had multiple test, still has episode of hematuria, but anemia is now controlled with iron supplementation. He is not sure if he saw nephrologist but was told to stay on ARB for kidney protection  Morbid obesity: he has decrease portion sizes and has been losing weight, discussed healthier diet also, eats on the go.  HTN: bp is at goal, taking medication as prescribed, no chest pain or palpitation, seen by cardiologist for abnormal ekg in the past  Hyperlipidemia: recheck labs  ED: he states symptoms were worse after cystoscopies but doing better now, he will call back for refills of cialis if needed.   USPSTF grade A and B recommendations:   Depression:  Depression screen [facility] 2/9 05/17/2017 01/31/2017 11/01/2016 08/01/2016 04/28/2016  Decreased Interest 0 0 0 0 0  Down, Depressed, Hopeless 0 0 0 0 0  PHQ - 2 Score 0 0 0 0 0    Hypertension:  BP Readings from Last 3 Encounters:  05/17/17 128/74  02/24/17 123/81  02/02/17 130/81    Obesity: Wt Readings from Last 3 Encounters:  05/17/17 287 lb 4.8 oz (130.3 kg)  02/24/17 288 lb (130.6 kg)  02/02/17 300 lb (136.1 kg)   BMI Readings from Last 3 Encounters:  05/17/17 38.96 kg/m  02/24/17 40.17 kg/m  02/02/17 41.84 kg/m     Lipids:  Lab Results  Component Value Date   CHOL 158 11/01/2016   CHOL 174 03/28/2016   CHOL 175 07/24/2015   Lab Results  Component Value Date   HDL 52 11/01/2016   HDL 48 03/28/2016   HDL 51 07/24/2015   Lab Results  Component Value Date   LDLCALC 89 11/01/2016   LDLCALC 114 (H) 03/28/2016   LDLCALC 113 07/24/2015   Lab Results  Component Value Date   TRIG 79 11/01/2016   TRIG 60  03/28/2016   TRIG 53 07/24/2015   Lab Results  Component Value Date   CHOLHDL 3.0 11/01/2016   CHOLHDL 3.6 03/28/2016   CHOLHDL 3.4 07/24/2015   No results found for: LDLDIRECT Glucose:  Glucose  Date Value Ref Range Status  05/18/2016 95 65 - 99 mg/dL Final  40/98/1191 88 65 - 99 mg/dL Final   Glucose, Bld  Date Value Ref Range Status  11/01/2016 87 65 - 99 mg/dL Final    Comment:    .            Fasting reference interval .   07/28/2016 96 65 - 99 mg/dL Final  47/82/9562 98 65 - 99 mg/dL Final     STD testing and prevention (chl/gon/syphilis): married HIV, hep C: N/A Colorectal cancer: 02/2017, Dr. Tobi Bastos mention capsule colonoscopy, we will contact him, however I think iron deficiency anemia likely from episodes of gross hematuria and is doing well in terms of supplementation  Prostate cancer: discussed USPTF and he wants PSA, family history of prostate cancer  Lab Results  Component Value Date   PSA 0.7 03/28/2016    IPSS Questionnaire (AUA-7): Over the past month.   1)  How often have you had a sensation of not emptying your bladder completely  after you finish urinating?  0 - Not at all  2)  How often have you had to urinate again less than two hours after you finished urinating? 0 - Not at all  3)  How often have you found you stopped and started again several times when you urinated?  0 - Not at all  4) How difficult have you found it to postpone urination?  0 - Not at all  5) How often have you had a weak urinary stream?  0 - Not at all  6) How often have you had to push or strain to begin urination?  0 - Not at all  7) How many times did you most typically get up to urinate from the time you went to bed until the time you got up in the morning?  1 - 1 time  Total score:  0-7 mildly symptomatic   8-19 moderately symptomatic   20-35 severely symptomatic   Lung cancer:  Low Dose CT Chest recommended if Age 66-80 years, 30 pack-year currently smoking OR have  quit w/in 15years. Patient does not qualify.   ECG:  Is up to date  Advanced Care Planning: A voluntary discussion about advance care planning including the explanation and discussion of advance directives.  Discussed health care proxy and Living will, and the patient was able to identify a health care proxy as wife.  Patient does not have a living will at present time. If patient does have living will, I have requested they bring this to the clinic to be scanned in to their chart.  Patient Active Problem List   Diagnosis Date Noted  . History of kidney stones 05/17/2017  . Abnormal ECG 05/30/2016  . History of iron deficiency anemia 04/28/2016  . History of diverticulitis 03/04/2016  . Hyperlipidemia 01/27/2015  . Hematuria, gross 07/10/2014  . Failure of erection 07/10/2014  . Hypertension, benign 07/10/2014  . Morbid obesity (HCC) 10/09/2013  . Vitamin D deficiency 10/09/2013    Past Surgical History:  Procedure Laterality Date  . ANKLE SURGERY Left 03/07/2011   fracture from trauma  . COLONOSCOPY WITH PROPOFOL N/A 07/01/2016   Procedure: COLONOSCOPY WITH PROPOFOL;  Surgeon: Wyline Mood, MD;  Location: [facility] ENDOSCOPY;  Service: Endoscopy;  Laterality: N/A;  . CYSTOSCOPY W/ RETROGRADES Bilateral 06/01/2016   Procedure: CYSTOSCOPY WITH RETROGRADE PYELOGRAM;  Surgeon: Hildred Laser, MD;  Location: [facility] ORS;  Service: Urology;  Laterality: Bilateral;  . CYSTOSCOPY WITH STENT PLACEMENT Left 06/01/2016   Procedure: CYSTOSCOPY WITH STENT PLACEMENT;  Surgeon: Hildred Laser, MD;  Location: [facility] ORS;  Service: Urology;  Laterality: Left;  . ESOPHAGOGASTRODUODENOSCOPY (EGD) WITH PROPOFOL N/A 07/01/2016   Procedure: ESOPHAGOGASTRODUODENOSCOPY (EGD) WITH PROPOFOL;  Surgeon: Wyline Mood, MD;  Location: [facility] ENDOSCOPY;  Service: Endoscopy;  Laterality: N/A;  . FLEXIBLE SIGMOIDOSCOPY N/A 02/24/2017   Procedure: FLEXIBLE SIGMOIDOSCOPY;  Surgeon: Wyline Mood, MD;  Location: [facility] ENDOSCOPY;   Service: Gastroenterology;  Laterality: N/A;  . IR RADIOLOGIST EVAL & MGMT  06/16/2016  . IR RENAL SELECTIVE  UNI INC S&I MOD SED  07/28/2016  . IR US GUIDE VASC ACCESS LEFT  07/28/2016  . STONE EXTRACTION WITH BASKET Left 06/01/2016   Procedure: STONE EXTRACTION WITH BASKET;  Surgeon: Hildred Laser, MD;  Location: [facility] ORS;  Service: Urology;  Laterality: Left;  . TONSILLECTOMY    . TONSILLECTOMY AND ADENOIDECTOMY  age 21  . URETEROSCOPY Left 06/01/2016   Procedure: URETEROSCOPY;  Surgeon: Hildred Laser, MD;  Location:  [facility] ORS;  Service: Urology;  Laterality: Left;    Family History  Problem Relation Age of Onset  . Breast cancer Mother   . Hypertension Mother   . Diabetes Father   . Hypertension Father   . Hypertension Sister   . Pancreatic disease Sister   . Diabetes Sister   . Hypertension Sister   . Hypertension Brother   . Diverticulosis Brother   . Prostate cancer Maternal Uncle   . Prostate cancer Maternal Uncle   . Dementia Maternal Grandmother   . Leukemia Maternal Grandfather   . Heart disease Paternal Grandmother   . Stomach cancer Paternal Grandfather   . Kidney cancer Neg Hx   . Bladder Cancer Neg Hx     Social History   Socioeconomic History  . Marital status: Married    Spouse name: Sherrie  . Number of children: 1  . Years of education: Not on file  . Highest education level: 12th grade  Occupational History  . Occupation: Education officer, community  Social Needs  . Financial resource strain: Not hard at all  . Food insecurity:    Worry: Never true    Inability: Never true  . Transportation needs:    Medical: No    Non-medical: No  Tobacco Use  . Smoking status: Never Smoker  . Smokeless tobacco: Never Used  Substance and Sexual Activity  . Alcohol use: No    Alcohol/week: 0.0 oz  . Drug use: No  . Sexual activity: Yes    Partners: Female    Comment: wife hysterectomy   Lifestyle  . Physical activity:    Days per week: 1 day     Minutes per session: 120 min  . Stress: Not at all  Relationships  . Social connections:    Talks on phone: More than three times a week    Gets together: Twice a week    Attends religious service: More than 4 times per year    Active member of club or organization: Yes    Attends meetings of clubs or organizations: Never    Relationship status: Married  . Intimate partner violence:    Fear of current or ex partner: No    Emotionally abused: No    Physically abused: No    Forced sexual activity: No  Other Topics Concern  . Not on file  Social History Narrative  . Not on file     Current Outpatient Medications:  .  ferrous sulfate 325 (65 FE) MG EC tablet, Take 325 mg by mouth daily with breakfast. (0600 & 1530), Disp: , Rfl:  .  Multiple Vitamin (MULTIVITAMIN WITH MINERALS) TABS tablet, Take 1 tablet by mouth daily at 6 (six) AM., Disp: , Rfl:  .  ranitidine (ZANTAC) 75 MG tablet, Take 75 mg by mouth 2 (two) times daily as needed for heartburn. , Disp: , Rfl:  .  tadalafil (CIALIS) 20 MG tablet, Take 20 mg by mouth daily as needed for erectile dysfunction. , Disp: , Rfl:  .  telmisartan-hydrochlorothiazide (MICARDIS HCT) 80-25 MG tablet, Take 1 tablet by mouth daily., Disp: 90 tablet, Rfl: 0  No Known Allergies   ROS   Constitutional: Negative for fever , positive for weight change.  Respiratory: Negative for cough and shortness of breath.   Cardiovascular: Negative for chest pain or palpitations.  Gastrointestinal: Negative for abdominal pain, no bowel changes.  Musculoskeletal: Negative for gait problem or joint swelling.  Skin: Negative for rash.  Neurological: Negative  for dizziness or headache.  No other specific complaints in a complete review of systems (except as listed in HPI above).   Objective  Vitals:   05/17/17 1046  BP: 128/74  Pulse: 92  Resp: 16  Temp: (!) 97.5 F (36.4 C)  TempSrc: Oral  SpO2: 96%  Weight: 287 lb 4.8 oz (130.3 kg)  Height: 6'  (1.829 m)    Body mass index is 38.96 kg/m.  Physical Exam   Constitutional: Patient appears well-developed and obese  No distress.  HENT: Head: Normocephalic and atraumatic. Ears: B TMs ok, no erythema or effusion; Nose: Nose normal. Mouth/Throat: Oropharynx is clear and moist. No oropharyngeal exudate.  Eyes: Conjunctivae and EOM are normal. Pupils are equal, round, and reactive to light. No scleral icterus.  Neck: Normal range of motion. Neck supple. No JVD present. No thyromegaly present.  Cardiovascular: Normal rate, regular rhythm and normal heart sounds.  No murmur heard. No BLE edema. Pulmonary/Chest: Effort normal and breath sounds normal. No respiratory distress. Abdominal: Soft. Bowel sounds are normal, no distension. There is no tenderness. no masses, umbilical hernia reducible MALE GENITALIA: Normal descended testes bilaterally, no masses palpated, no hernias, no lesions, no discharge RECTAL: not done Musculoskeletal: Normal range of motion, no joint effusions. No gross deformities Neurological: he is alert and oriented to person, place, and time. No cranial nerve deficit. Coordination, balance, strength, speech and gait are normal.  Skin: Skin is warm and dry. No rash noted. No erythema.  Psychiatric: Patient has a normal mood and affect. behavior is normal. Judgment and thought content normal.    PHQ2/9: Depression screen [facility] 2/9 05/17/2017 01/31/2017 11/01/2016 08/01/2016 04/28/2016  Decreased Interest 0 0 0 0 0  Down, Depressed, Hopeless 0 0 0 0 0  PHQ - 2 Score 0 0 0 0 0    Fall Risk: Fall Risk  05/17/2017 05/17/2017 01/31/2017 11/01/2016 08/01/2016  Falls in the past year? Yes Yes No No No  Number falls in past yr: 2 or more 2 or more - - -  Injury with Fall? Yes Yes - - -  Risk Factor Category  High Fall Risk High Fall Risk - - -     Functional Status Survey: Is the patient deaf or have difficulty hearing?: No Does the patient have difficulty seeing, even when  wearing glasses/contacts?: Yes(Prescription glasses) Does the patient have difficulty concentrating, remembering, or making decisions?: No Does the patient have difficulty walking or climbing stairs?: No Does the patient have difficulty dressing or bathing?: No Does the patient have difficulty doing errands alone such as visiting a doctor's office or shopping?: No   Assessment & Plan  1. Encounter for routine history and physical exam for male  Discussed importance of 150 minutes of physical activity weekly, eat two servings of fish weekly, eat one serving of tree nuts ( cashews, pistachios, pecans, almonds.Marland Kitchen) every other day, eat 6 servings of fruit/vegetables daily and drink plenty of water and avoid sweet beverages.   2. Essential hypertension  - COMPLETE METABOLIC PANEL WITH GFR - TSH  3. Pure hypercholesterolemia  - Lipid panel  4. Iron deficiency anemia due to chronic blood loss  Doing better, getting iron supplementation, under the care of hematologist   5. Gross hematuria  Evaluated by urologist   6. Umbilical hernia without obstruction or gangrene  Stable, discussed when to go to [facility]   7. Vitamin D deficiency  Continue supplementation   8. Morbid obesity (HCC)  - Hemoglobin A1c  9. Screening for prostate cancer  - PSA

## 2017-05-17 NOTE — Telephone Encounter (Signed)
Jamie from [facility] left vm to find out if pt has had his Capsule study/ Colonoscopy please call her at [phone]

## 2017-05-17 NOTE — Telephone Encounter (Signed)
He saw Dr. Tobi Bastos, and supposed to have capsule colonoscopy. Please verify if it was scheduled. Patient was here today and states he is wiling to go through procedure if needed.

## 2017-05-17 NOTE — Telephone Encounter (Signed)
Left voicemail with Gi Doctor

## 2017-05-18 LAB — HEMOGLOBIN A1C
HEMOGLOBIN A1C: 5.5 %{Hb} (ref <5.7)
MEAN PLASMA GLUCOSE: 111 (calc)
eAG (mmol/L): 6.2 (calc)

## 2017-05-19 NOTE — Telephone Encounter (Signed)
Returned phone call. Advised that Angel Chase has declined having a capsule study at this time but was made aware of his colonoscopy results.   Notes and pathology have been forwarded to Dr. Carlynn Purl via [facility]  - Asher Muir from [facility] left vm to find out if pt has had his Capsule study/ Colonoscopy please call her at [phone]

## 2017-06-01 ENCOUNTER — Inpatient Hospital Stay: Payer: BLUE CROSS/BLUE SHIELD | Attending: Oncology

## 2017-06-01 DIAGNOSIS — D500 Iron deficiency anemia secondary to blood loss (chronic): Secondary | ICD-10-CM | POA: Diagnosis not present

## 2017-06-01 DIAGNOSIS — R319 Hematuria, unspecified: Secondary | ICD-10-CM | POA: Insufficient documentation

## 2017-06-01 LAB — IRON AND TIBC
IRON: 44 ug/dL — AB (ref 45–182)
SATURATION RATIOS: 14 % — AB (ref 17.9–39.5)
TIBC: 317 ug/dL (ref 250–450)
UIBC: 273 ug/dL

## 2017-06-01 LAB — CBC
HCT: 40.2 % (ref 40.0–52.0)
Hemoglobin: 13.8 g/dL (ref 13.0–18.0)
MCH: 29.1 pg (ref 26.0–34.0)
MCHC: 34.3 g/dL (ref 32.0–36.0)
MCV: 85 fL (ref 80.0–100.0)
Platelets: 209 10*3/uL (ref 150–440)
RBC: 4.74 MIL/uL (ref 4.40–5.90)
RDW: 14 % (ref 11.5–14.5)
WBC: 8.2 10*3/uL (ref 3.8–10.6)

## 2017-06-01 LAB — FERRITIN: FERRITIN: 22 ng/mL — AB (ref 24–336)

## 2017-06-06 ENCOUNTER — Other Ambulatory Visit: Payer: Self-pay

## 2017-06-06 DIAGNOSIS — I10 Essential (primary) hypertension: Secondary | ICD-10-CM

## 2017-06-06 MED ORDER — TELMISARTAN-HCTZ 80-25 MG PO TABS: 1.0000 | ORAL_TABLET | Freq: Every day | ORAL | 1 refills | Status: DC

## 2017-06-06 NOTE — Telephone Encounter (Signed)
Hypertension medication request: Micardis to [facility],  Last office visit pertaining to hypertension: 05/17/2017   BP Readings from Last 3 Encounters:  05/17/17 128/74  02/24/17 123/81  02/02/17 130/81    Lab Results  Component Value Date   CREATININE 0.92 05/17/2017   BUN 10 05/17/2017   NA 138 05/17/2017   K 4.0 05/17/2017   CL 101 05/17/2017   CO2 29 05/17/2017     Follow up on 05/21/2018

## 2017-06-19 ENCOUNTER — Other Ambulatory Visit: Payer: Self-pay | Admitting: Oncology

## 2017-06-19 ENCOUNTER — Telehealth: Payer: Self-pay

## 2017-06-19 NOTE — Telephone Encounter (Signed)
-----   Message from Creig HinesArchana C Rao, MD sent at 06/19/2017  8:21 AM EDT ----- Ferritin is trending low. Will need feraheme. Please let him know. I will put treatment plan in. Thanks, Ovidio KinArchana

## 2017-06-19 NOTE — Telephone Encounter (Signed)
Spoke with Mr. Angel Chase to inform him of his ferritin levels are running low. per Dr Smith Robertao / want the patient to have feraheme treatment.

## 2017-06-21 ENCOUNTER — Telehealth: Payer: Self-pay | Admitting: *Deleted

## 2017-06-21 NOTE — Telephone Encounter (Signed)
Called pt and let him know that I was calling to set up appt for ferahem x 2. Toni AmendCourtney had spoke to him and he was agreeable to the infusions. He can't due this week and he wants it on fridays but this week he has dentist appt.  Made appt for feraheme on 6/14 and 6/21 at 2 pm and pt aware of the appts and is agreeable to this.

## 2017-06-22 ENCOUNTER — Ambulatory Visit: Payer: BLUE CROSS/BLUE SHIELD

## 2017-06-30 ENCOUNTER — Inpatient Hospital Stay: Payer: BLUE CROSS/BLUE SHIELD | Attending: Oncology

## 2017-06-30 VITALS — BP 102/66 | HR 87 | Temp 96.4°F | Resp 18

## 2017-06-30 DIAGNOSIS — D500 Iron deficiency anemia secondary to blood loss (chronic): Secondary | ICD-10-CM | POA: Diagnosis not present

## 2017-06-30 DIAGNOSIS — R531 Weakness: Secondary | ICD-10-CM | POA: Diagnosis not present

## 2017-06-30 DIAGNOSIS — K5790 Diverticulosis of intestine, part unspecified, without perforation or abscess without bleeding: Secondary | ICD-10-CM | POA: Diagnosis not present

## 2017-06-30 DIAGNOSIS — Z862 Personal history of diseases of the blood and blood-forming organs and certain disorders involving the immune mechanism: Secondary | ICD-10-CM

## 2017-06-30 MED ORDER — SODIUM CHLORIDE 0.9 % IV SOLN
Freq: Once | INTRAVENOUS | Status: AC
  Administered 2017-06-30: 14:00:00 via INTRAVENOUS
  Filled 2017-06-30: qty 1000

## 2017-06-30 MED ORDER — SODIUM CHLORIDE 0.9 % IV SOLN
510.0000 mg | Freq: Once | INTRAVENOUS | Status: AC
  Administered 2017-06-30: 510 mg via INTRAVENOUS
  Filled 2017-06-30: qty 17

## 2017-06-30 NOTE — Progress Notes (Signed)
Pt tolerated Feraheme infusion well. Pt and VS stable at discharge.

## 2017-07-07 ENCOUNTER — Inpatient Hospital Stay: Payer: BLUE CROSS/BLUE SHIELD

## 2017-07-07 VITALS — BP 114/78 | HR 86 | Temp 97.5°F | Resp 18

## 2017-07-07 DIAGNOSIS — K5790 Diverticulosis of intestine, part unspecified, without perforation or abscess without bleeding: Secondary | ICD-10-CM | POA: Diagnosis not present

## 2017-07-07 DIAGNOSIS — R531 Weakness: Secondary | ICD-10-CM | POA: Diagnosis not present

## 2017-07-07 DIAGNOSIS — Z862 Personal history of diseases of the blood and blood-forming organs and certain disorders involving the immune mechanism: Secondary | ICD-10-CM

## 2017-07-07 DIAGNOSIS — D500 Iron deficiency anemia secondary to blood loss (chronic): Secondary | ICD-10-CM | POA: Diagnosis not present

## 2017-07-07 MED ORDER — SODIUM CHLORIDE 0.9 % IV SOLN
Freq: Once | INTRAVENOUS | Status: AC
  Administered 2017-07-07: 14:00:00 via INTRAVENOUS
  Filled 2017-07-07: qty 1000

## 2017-07-07 MED ORDER — SODIUM CHLORIDE 0.9 % IV SOLN
510.0000 mg | Freq: Once | INTRAVENOUS | Status: AC
  Administered 2017-07-07: 510 mg via INTRAVENOUS
  Filled 2017-07-07: qty 17

## 2017-07-21 ENCOUNTER — Encounter: Payer: Self-pay | Admitting: Family Medicine

## 2017-07-21 ENCOUNTER — Ambulatory Visit: Payer: BLUE CROSS/BLUE SHIELD | Admitting: Family Medicine

## 2017-07-21 VITALS — BP 118/76 | HR 100 | Temp 98.1°F | Resp 18 | Ht 72.0 in | Wt 292.3 lb

## 2017-07-21 DIAGNOSIS — S46211A Strain of muscle, fascia and tendon of other parts of biceps, right arm, initial encounter: Secondary | ICD-10-CM | POA: Diagnosis not present

## 2017-07-21 NOTE — Progress Notes (Signed)
Name: Angel Chase   MRN: [id number]    DOB: 12/01/1965   Date:07/21/2017       Progress Note  Subjective  Chief Complaint  Chief Complaint  Patient presents with  . Arm Pain    right arm swollen for 3 days, patient was lifting    HPI  PT presents with concern for right bicep injury - he was lifting a heavy desk a few days ago and injured his RIGHT bicep.  The area is slightly tender to touch, with some swelling, he endorses pain against resistance/when lifting items.  No numbness/tingling in RUE.  He has been taking Tylenol and this has been helping his pain.  He cannot take NSAIDs due to chronic hematuria.  Patient Active Problem List   Diagnosis Date Noted  . History of kidney stones 05/17/2017  . Abnormal ECG 05/30/2016  . History of iron deficiency anemia 04/28/2016  . History of diverticulitis 03/04/2016  . Hyperlipidemia 01/27/2015  . Hematuria, gross 07/10/2014  . Failure of erection 07/10/2014  . Hypertension, benign 07/10/2014  . Morbid obesity (HCC) 10/09/2013  . Vitamin D deficiency 10/09/2013    Social History   Tobacco Use  . Smoking status: Never Smoker  . Smokeless tobacco: Never Used  Substance Use Topics  . Alcohol use: No    Alcohol/week: 0.0 oz     Current Outpatient Medications:  .  ferrous sulfate 325 (65 FE) MG EC tablet, Take 325 mg by mouth daily with breakfast. (0600 & 1530), Disp: , Rfl:  .  Multiple Vitamin (MULTIVITAMIN WITH MINERALS) TABS tablet, Take 1 tablet by mouth daily at 6 (six) AM., Disp: , Rfl:  .  ranitidine (ZANTAC) 75 MG tablet, Take 75 mg by mouth 2 (two) times daily as needed for heartburn. , Disp: , Rfl:  .  tadalafil (CIALIS) 20 MG tablet, Take 20 mg by mouth daily as needed for erectile dysfunction. , Disp: , Rfl:  .  telmisartan-hydrochlorothiazide (MICARDIS HCT) 80-25 MG tablet, Take 1 tablet by mouth daily., Disp: 90 tablet, Rfl: 1  No Known Allergies  ROS Ten systems reviewed and is negative except as mentioned in  HPI  Objective  Vitals:   07/21/17 1356  BP: 118/76  Pulse: 100  Resp: 18  Temp: 98.1 F (36.7 C)  TempSrc: Oral  SpO2: 96%  Weight: 292 lb 4.8 oz (132.6 kg)  Height: 6' (1.829 m)   Body mass index is 39.64 kg/m.  Nursing Note and Vital Signs reviewed.  Physical Exam Constitutional: Patient appears well-developed and well-nourished. Obese No distress.  HEENT: head atraumatic, normocephalic Cardiovascular: Normal rate, regular rhythm, S1/S2 present.  No murmur or rub heard. No BLE edema. Pulmonary/Chest: Effort normal and breath sounds clear. No respiratory distress or retractions. Psychiatric: Patient has a normal mood and affect. behavior is normal. Judgment and thought content normal. Musculoskeletal: Normal range of motion, no joint effusions. No gross deformities. RUE exhibits +5 strength on extension, flexion, lateral, and medial movements against resistance.  Bicep and tricep are palpable and smooth, without clicks or pops on movement.  Neurological: he is alert and oriented to person, place, and time. No cranial nerve deficit. Coordination, balance, strength, speech and gait are normal.   No results found for this or any previous visit (from the past 72 hour(s)).  Assessment & Plan  1. Strain of right biceps, initial encounter - RICE discussed in detail. - Cannot prescribe NSAIDs due to chronic hematuria and anemia.  - If any worsening over  the weekend will present to urgent care, if not improving by Monday, will call office for Ortho referral.   -Red flags and when to present for emergency care or RTC including fever >101.25F, chest pain, shortness of breath, new/worsening/un-resolving symptoms, significant worsening of arm pain, new onset arm weakness reviewed with patient at time of visit. Follow up and care instructions discussed and provided in AVS.

## 2017-07-21 NOTE — Patient Instructions (Signed)
RICE for Routine Care of Injuries Many injuries can be cared for using rest, ice, compression, and elevation (RICE therapy). Using RICE therapy can help to lessen pain and swelling. It can help your body to heal. Rest Reduce your normal activities and avoid using the injured part of your body. You can go back to your normal activities when you feel okay and your doctor says it is okay. Ice Do not put ice on your bare skin.  Put ice in a plastic bag.  Place a towel between your skin and the bag.  Leave the ice on for 20 minutes, 2-3 times a day.  Do this for as long as told by your doctor. Compression Compression means putting pressure on the injured area. This can be done with an elastic bandage. If an elastic bandage has been applied:  Remove and reapply the bandage every 3-4 hours or as told by your doctor.  Make sure the bandage is not wrapped too tight. Wrap the bandage more loosely if part of your body beyond the bandage is blue, swollen, cold, painful, or loses feeling (numb).  See your doctor if the bandage seems to make your problems worse.  Elevation Elevation means keeping the injured area raised. Raise the injured area above your heart or the center of your chest if you can. When should I get help? You should get help if:  You keep having pain and swelling.  Your symptoms get worse.  Get help right away if: You should get help right away if:  You have sudden bad pain at or below the area of your injury.  You have redness or more swelling around your injury.  You have tingling or numbness at or below the injury that does not go away when you take off the bandage.  This information is not intended to replace advice given to you by your health care provider. Make sure you discuss any questions you have with your health care provider. Document Released: 06/22/2007 Document Revised: 12/01/2015 Document Reviewed: 12/11/2013 Elsevier Interactive Patient Education  2017  [employer]  

## 2017-10-05 ENCOUNTER — Inpatient Hospital Stay (HOSPITAL_BASED_OUTPATIENT_CLINIC_OR_DEPARTMENT_OTHER): Payer: BLUE CROSS/BLUE SHIELD | Admitting: Oncology

## 2017-10-05 ENCOUNTER — Encounter: Payer: Self-pay | Admitting: Oncology

## 2017-10-05 ENCOUNTER — Inpatient Hospital Stay: Payer: BLUE CROSS/BLUE SHIELD | Attending: Oncology

## 2017-10-05 VITALS — BP 122/69 | HR 85 | Temp 97.3°F | Resp 18 | Ht 72.0 in | Wt 305.2 lb

## 2017-10-05 DIAGNOSIS — D500 Iron deficiency anemia secondary to blood loss (chronic): Secondary | ICD-10-CM

## 2017-10-05 DIAGNOSIS — R319 Hematuria, unspecified: Secondary | ICD-10-CM

## 2017-10-05 DIAGNOSIS — D508 Other iron deficiency anemias: Secondary | ICD-10-CM

## 2017-10-05 LAB — IRON AND TIBC
Iron: 62 ug/dL (ref 45–182)
SATURATION RATIOS: 21 % (ref 17.9–39.5)
TIBC: 301 ug/dL (ref 250–450)
UIBC: 239 ug/dL

## 2017-10-05 LAB — CBC
HCT: 42.1 % (ref 40.0–52.0)
Hemoglobin: 14.5 g/dL (ref 13.0–18.0)
MCH: 29.8 pg (ref 26.0–34.0)
MCHC: 34.5 g/dL (ref 32.0–36.0)
MCV: 86.3 fL (ref 80.0–100.0)
Platelets: 185 10*3/uL (ref 150–440)
RBC: 4.88 MIL/uL (ref 4.40–5.90)
RDW: 13.9 % (ref 11.5–14.5)
WBC: 8.1 10*3/uL (ref 3.8–10.6)

## 2017-10-05 NOTE — Progress Notes (Signed)
No new changes noted today 

## 2017-10-05 NOTE — Progress Notes (Signed)
Hematology/Oncology Consult note [facility]  [phone] [phone]  Patient Care Team: [name], MD as PCP - General (Family Medicine) Creig Hines, MD as Consulting Physician (Oncology) Hildred Laser, MD as Consulting Physician (Urology)   Name of the patient: Angel Chase  [id number]  [birth year]   Date of visit: 10/05/17  Diagnosis- iron deficiency anemia likely due to hematuria  Chief complaint/ Reason for visit- routine f/u of iron deficiency anemia  Heme/Onc history: Patient is a 52 year old male who has a long standing history of hematuria. He has been seen by both nephrology and urology in the past to evaluate his hematuria and states he underwent extensive testing but no cause for hematuria was found. He has persistent hematuria all along sometimes tea colored sometimes bright red but was never anemic in the past. No family h/o bleeding disorders. No gum bleeds, nose bleeds or bleeding in his stool. He has had ankle surgery in the past without any bleeding issues.  He has been referred to [place] for evaluation and management of anemia. Most recent CBC from 04/28/2016 showed white count of 6.8, H&H of 10.9/35.9 with an MCV of 72.5 and a platelet count of 295. Iron studies revealed a low serum iron of 18 and low iron saturation of 4%. TIBC was high normal at 407. Ferritin was low at 6. Patient is yet to see urology for hematuria. He has also been referred to [facility] h/o diverticulitis. He has never tried oral iron  Also seen by Dr. Tobi Bastos from GI and underwent EGD and colonoscopy which was unremarkable  He received 2 doses of Feraheme in June 2019  Interval history-reports some improvement in his as energy levels after receiving IV iron.  He continues to have on and off hematuria.  ECOG PS- 0 Pain scale- 0 Opioid associated constipation- no  Review of systems- Review of Systems  Constitutional: Negative for chills,  fever, malaise/fatigue and weight loss.  HENT: Negative for congestion, ear discharge and nosebleeds.   Eyes: Negative for blurred vision.  Respiratory: Negative for cough, hemoptysis, sputum production, shortness of breath and wheezing.   Cardiovascular: Negative for chest pain, palpitations, orthopnea and claudication.  Gastrointestinal: Negative for abdominal pain, blood in stool, constipation, diarrhea, heartburn, melena, nausea and vomiting.  Genitourinary: Positive for hematuria. Negative for dysuria, flank pain, frequency and urgency.  Musculoskeletal: Negative for back pain, joint pain and myalgias.  Skin: Negative for rash.  Neurological: Negative for dizziness, tingling, focal weakness, seizures, weakness and headaches.  Endo/Heme/Allergies: Does not bruise/bleed easily.  Psychiatric/Behavioral: Negative for depression and suicidal ideas. The patient does not have insomnia.       No Known Allergies   Past Medical History:  Diagnosis Date  . Anemia   . ED (erectile dysfunction)   . EKG abnormalities   . Hematuria   . Hematuria    x 20 yr per pt  . History of kidney stones   . Hypertension   . Obesity   . Obstructive sleep apnea   . Vitamin D deficiency      Past Surgical History:  Procedure Laterality Date  . ANKLE SURGERY Left 03/07/2011   fracture from trauma  . COLONOSCOPY WITH PROPOFOL N/A 07/01/2016   Procedure: COLONOSCOPY WITH PROPOFOL;  Surgeon: Wyline Mood, MD;  Location: [facility] ENDOSCOPY;  Service: Endoscopy;  Laterality: N/A;  . CYSTOSCOPY W/ RETROGRADES Bilateral 06/01/2016   Procedure: CYSTOSCOPY WITH RETROGRADE PYELOGRAM;  Surgeon: Hildred Laser, MD;  Location:  [facility] ORS;  Service: Urology;  Laterality: Bilateral;  . CYSTOSCOPY WITH STENT PLACEMENT Left 06/01/2016   Procedure: CYSTOSCOPY WITH STENT PLACEMENT;  Surgeon: Hildred Laser, MD;  Location: [facility] ORS;  Service: Urology;  Laterality: Left;  . ESOPHAGOGASTRODUODENOSCOPY (EGD) WITH PROPOFOL  N/A 07/01/2016   Procedure: ESOPHAGOGASTRODUODENOSCOPY (EGD) WITH PROPOFOL;  Surgeon: Wyline Mood, MD;  Location: [facility] ENDOSCOPY;  Service: Endoscopy;  Laterality: N/A;  . FLEXIBLE SIGMOIDOSCOPY N/A 02/24/2017   Procedure: FLEXIBLE SIGMOIDOSCOPY;  Surgeon: Wyline Mood, MD;  Location: [facility] ENDOSCOPY;  Service: Gastroenterology;  Laterality: N/A;  . IR RADIOLOGIST EVAL & MGMT  06/16/2016  . IR RENAL SELECTIVE  UNI INC S&I MOD SED  07/28/2016  . IR US GUIDE VASC ACCESS LEFT  07/28/2016  . STONE EXTRACTION WITH BASKET Left 06/01/2016   Procedure: STONE EXTRACTION WITH BASKET;  Surgeon: Hildred Laser, MD;  Location: [facility] ORS;  Service: Urology;  Laterality: Left;  . TONSILLECTOMY    . TONSILLECTOMY AND ADENOIDECTOMY  age 37  . URETEROSCOPY Left 06/01/2016   Procedure: URETEROSCOPY;  Surgeon: Hildred Laser, MD;  Location: [facility] ORS;  Service: Urology;  Laterality: Left;    Social History   Socioeconomic History  . Marital status: Married    Spouse name: Sherrie  . Number of children: 1  . Years of education: Not on file  . Highest education level: 12th grade  Occupational History  . Occupation: Education officer, community  Social Needs  . Financial resource strain: Not hard at all  . Food insecurity:    Worry: Never true    Inability: Never true  . Transportation needs:    Medical: No    Non-medical: No  Tobacco Use  . Smoking status: Never Smoker  . Smokeless tobacco: Never Used  Substance and Sexual Activity  . Alcohol use: No    Alcohol/week: 0.0 standard drinks  . Drug use: No  . Sexual activity: Yes    Partners: Female    Comment: wife hysterectomy   Lifestyle  . Physical activity:    Days per week: 1 day    Minutes per session: 120 min  . Stress: Not at all  Relationships  . Social connections:    Talks on phone: More than three times a week    Gets together: Twice a week    Attends religious service: More than 4 times per year    Active member of club or  organization: Yes    Attends meetings of clubs or organizations: Never    Relationship status: Married  . Intimate partner violence:    Fear of current or ex partner: No    Emotionally abused: No    Physically abused: No    Forced sexual activity: No  Other Topics Concern  . Not on file  Social History Narrative  . Not on file    Family History  Problem Relation Age of Onset  . Breast cancer Mother   . Hypertension Mother   . Diabetes Father   . Hypertension Father   . Hypertension Sister   . Pancreatic disease Sister   . Diabetes Sister   . Hypertension Sister   . Hypertension Brother   . Diverticulosis Brother   . Prostate cancer Maternal Uncle   . Prostate cancer Maternal Uncle   . Dementia Maternal Grandmother   . Leukemia Maternal Grandfather   . Heart disease Paternal Grandmother   . Stomach cancer Paternal Grandfather   . Kidney cancer Neg Hx   .  Bladder Cancer Neg Hx      Current Outpatient Medications:  .  ferrous sulfate 325 (65 FE) MG EC tablet, Take 325 mg by mouth daily with breakfast. (0600 & 1530), Disp: , Rfl:  .  Multiple Vitamin (MULTIVITAMIN WITH MINERALS) TABS tablet, Take 1 tablet by mouth daily at 6 (six) AM., Disp: , Rfl:  .  telmisartan-hydrochlorothiazide (MICARDIS HCT) 80-25 MG tablet, Take 1 tablet by mouth daily., Disp: 90 tablet, Rfl: 1 .  ranitidine (ZANTAC) 75 MG tablet, Take 75 mg by mouth 2 (two) times daily as needed for heartburn. , Disp: , Rfl:  .  tadalafil (CIALIS) 20 MG tablet, Take 20 mg by mouth daily as needed for erectile dysfunction. , Disp: , Rfl:   Physical exam:  Vitals:   10/05/17 0924  BP: 122/69  Pulse: 85  Resp: 18  Temp: (!) 97.3 F (36.3 C)  TempSrc: Tympanic  SpO2: 95%  Weight: (!) 305 lb 3.2 oz (138.4 kg)  Height: 6' (1.829 m)   Physical Exam  Constitutional: He is oriented to person, place, and time. He appears well-developed and well-nourished.  HENT:  Head: Normocephalic and atraumatic.  Eyes:  Pupils are equal, round, and reactive to light. EOM are normal.  Neck: Normal range of motion.  Cardiovascular: Normal rate, regular rhythm and normal heart sounds.  Pulmonary/Chest: Effort normal and breath sounds normal.  Abdominal: Soft. Bowel sounds are normal.  Neurological: He is alert and oriented to person, place, and time.  Skin: Skin is warm and dry.     CMP Latest Ref Rng & Units 05/17/2017  Glucose 65 - 99 mg/dL 89  BUN 7 - 25 mg/dL 10  Creatinine 1.610.60 - 0.961.35 mg/dL 0.450.92  Sodium 409135 - 811146 mmol/L 138  Potassium 3.5 - 5.3 mmol/L 4.0  Chloride 98 - 110 mmol/L 101  CO2 20 - 32 mmol/L 29  Calcium 8.6 - 10.3 mg/dL 9.8  Total Protein 6.1 - 8.1 g/dL 6.9  Total Bilirubin 0.2 - 1.2 mg/dL 0.8  Alkaline Phos 40 - 115 U/L -  AST 10 - 40 U/L 14  ALT 9 - 46 U/L 15   CBC Latest Ref Rng & Units 10/05/2017  WBC 3.8 - 10.6 K/uL 8.1  Hemoglobin 13.0 - 18.0 g/dL 91.414.5  Hematocrit 78.240.0 - 52.0 % 42.1  Platelets 150 - 440 K/uL 185      Assessment and plan- Patient is a 52 y.o. male with iron deficiency anemia possibly secondary to hematuria  Hemoglobin is improved to 14 point 2:05 doses of Feraheme.  Iron studies from today are pending.  Do not anticipate that he will need IV iron today.  Repeat CBC with differential and iron studies and ferritin in 4 and 8 months and I will see him back in 8 months   Visit Diagnosis 1. Iron deficiency anemia due to chronic blood loss      Dr. Owens SharkArchana Rao, MD, MPH [facility] [phone] 10/05/2017 11:42 AM

## 2017-12-06 ENCOUNTER — Other Ambulatory Visit: Payer: Self-pay | Admitting: Family Medicine

## 2017-12-06 DIAGNOSIS — I10 Essential (primary) hypertension: Secondary | ICD-10-CM

## 2018-02-02 ENCOUNTER — Inpatient Hospital Stay: Payer: BLUE CROSS/BLUE SHIELD | Attending: Oncology

## 2018-02-02 ENCOUNTER — Encounter (INDEPENDENT_AMBULATORY_CARE_PROVIDER_SITE_OTHER): Payer: Self-pay

## 2018-02-02 DIAGNOSIS — D500 Iron deficiency anemia secondary to blood loss (chronic): Secondary | ICD-10-CM

## 2018-02-02 DIAGNOSIS — D508 Other iron deficiency anemias: Secondary | ICD-10-CM | POA: Insufficient documentation

## 2018-02-02 DIAGNOSIS — R319 Hematuria, unspecified: Secondary | ICD-10-CM | POA: Diagnosis not present

## 2018-02-02 LAB — CBC
HCT: 41.3 % (ref 39.0–52.0)
Hemoglobin: 13.5 g/dL (ref 13.0–17.0)
MCH: 27.9 pg (ref 26.0–34.0)
MCHC: 32.7 g/dL (ref 30.0–36.0)
MCV: 85.3 fL (ref 80.0–100.0)
PLATELETS: 205 10*3/uL (ref 150–400)
RBC: 4.84 MIL/uL (ref 4.22–5.81)
RDW: 12.7 % (ref 11.5–15.5)
WBC: 7.8 10*3/uL (ref 4.0–10.5)
nRBC: 0 % (ref 0.0–0.2)

## 2018-02-02 LAB — FERRITIN: Ferritin: 31 ng/mL (ref 24–336)

## 2018-02-02 LAB — IRON AND TIBC
IRON: 52 ug/dL (ref 45–182)
Saturation Ratios: 18 % (ref 17.9–39.5)
TIBC: 292 ug/dL (ref 250–450)
UIBC: 240 ug/dL

## 2018-03-05 ENCOUNTER — Other Ambulatory Visit: Payer: Self-pay | Admitting: Family Medicine

## 2018-03-05 DIAGNOSIS — I10 Essential (primary) hypertension: Secondary | ICD-10-CM

## 2018-03-05 NOTE — Telephone Encounter (Signed)
Refill request for Hypertension medication:  Telmisartan-HCTZ 80-25 mg  Last office visit pertaining to hypertension: 05/17/2017  BP Readings from Last 3 Encounters:  10/05/17 122/69  07/21/17 118/76  07/07/17 114/78     Lab Results  Component Value Date   CREATININE 0.92 05/17/2017   BUN 10 05/17/2017   NA 138 05/17/2017   K 4.0 05/17/2017   CL 101 05/17/2017   CO2 29 05/17/2017   Follow-ups on file. 05/21/2018

## 2018-03-05 NOTE — Telephone Encounter (Signed)
He needs follow up, can he be seen tomorrow?

## 2018-03-05 NOTE — Telephone Encounter (Signed)
Spoke with patient and he scheduled appt for 3.12.2020, he was not able to make it tomorrow due to working out of town

## 2018-03-07 IMAGING — US US RENAL
1 series · 14 of 25 positions shown · non-contrast
Comparison: CT 02/05/2016 .

CLINICAL DATA: History of nephrolithiasis.

EXAM:
RENAL / URINARY TRACT ULTRASOUND COMPLETE

[Series 1: us renal · 0.30mm/px · 14 of 46 slices shown]
[im 1/46]
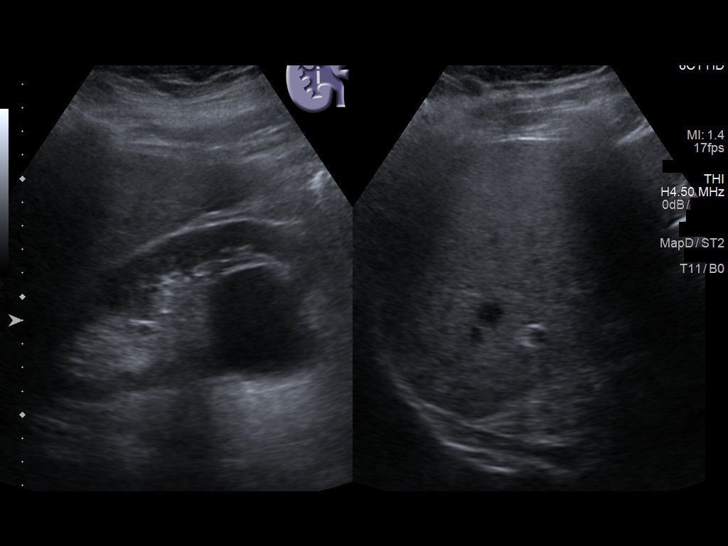
[im 4/46]
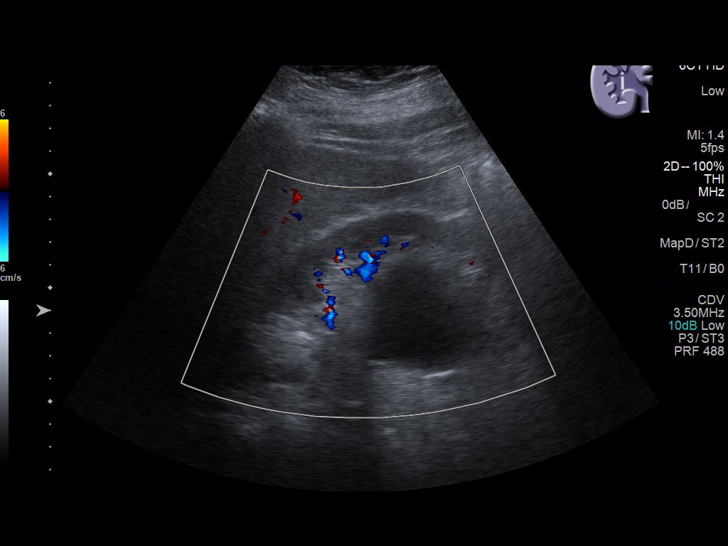
[im 8/46]
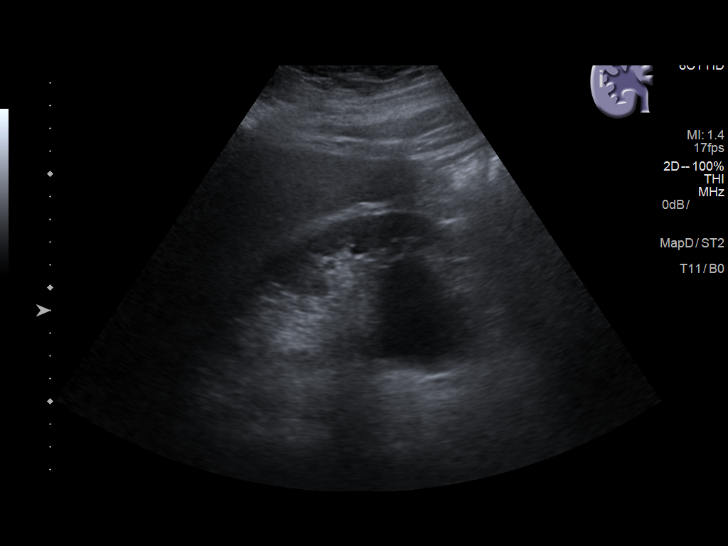
[im 12/46]
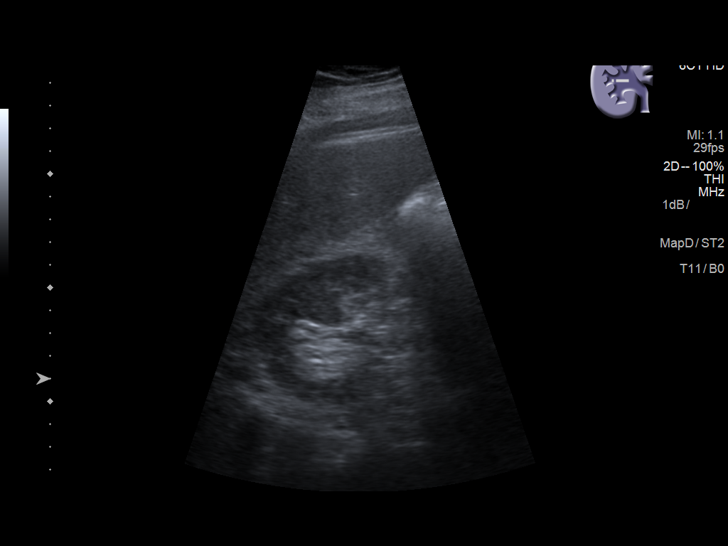
[im 16/46]
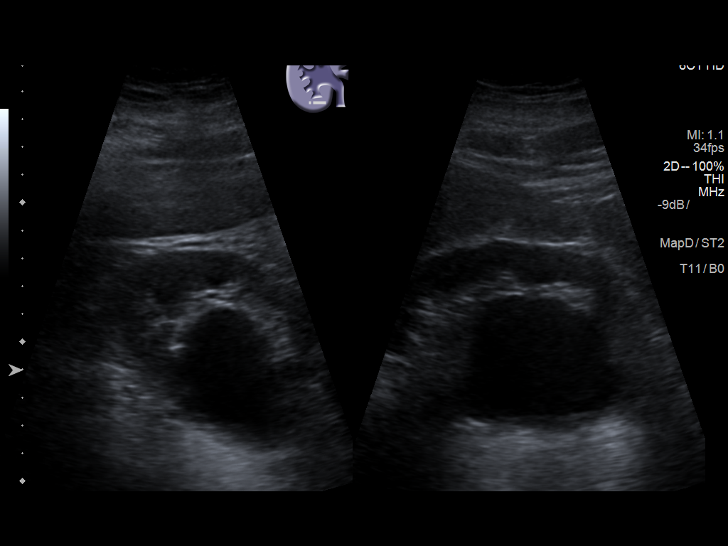
[im 17/46]
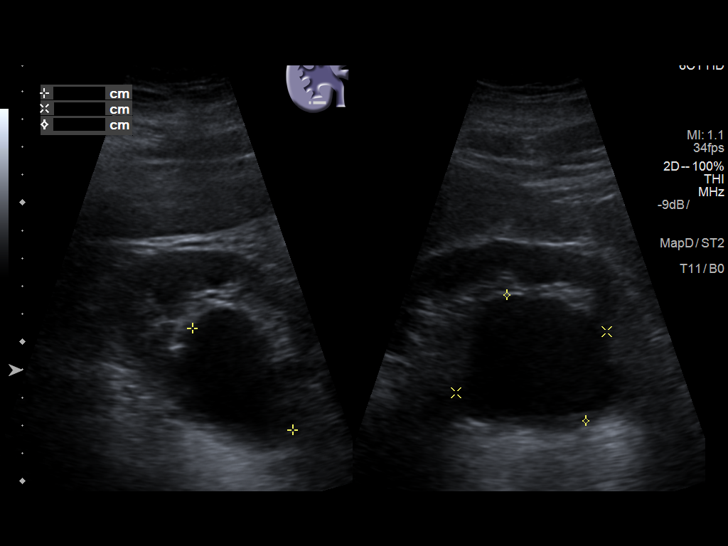
[im 21/46]
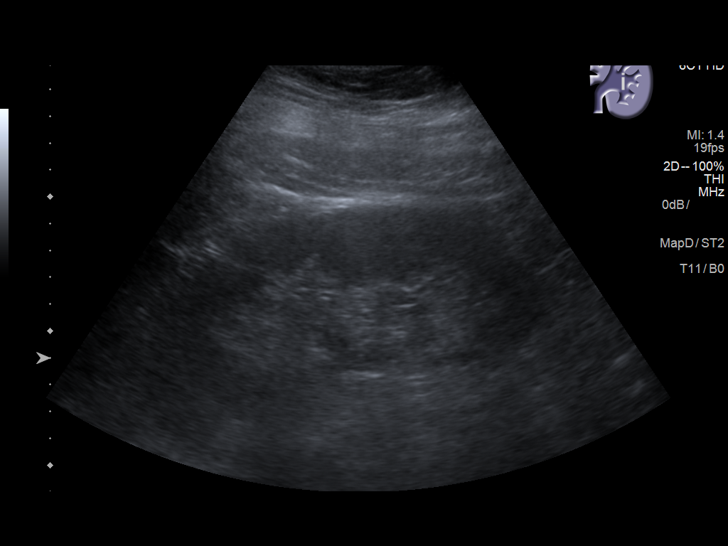
[im 25/46]
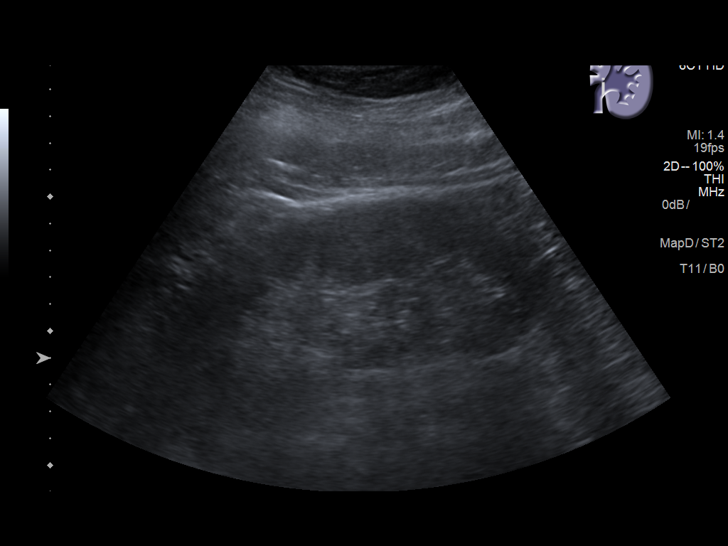
[im 29/46]
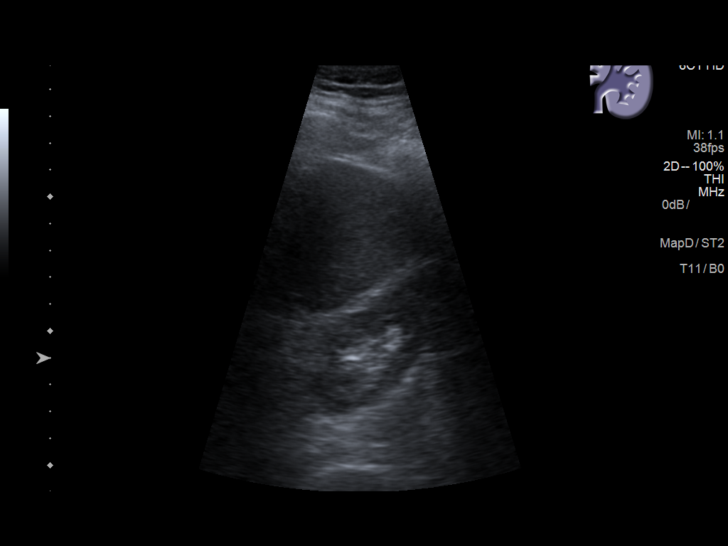
[im 31/46]
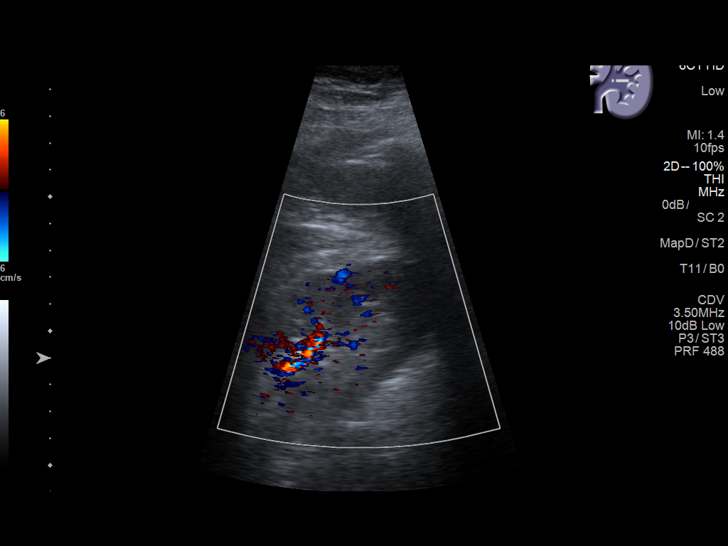
[im 34/46]
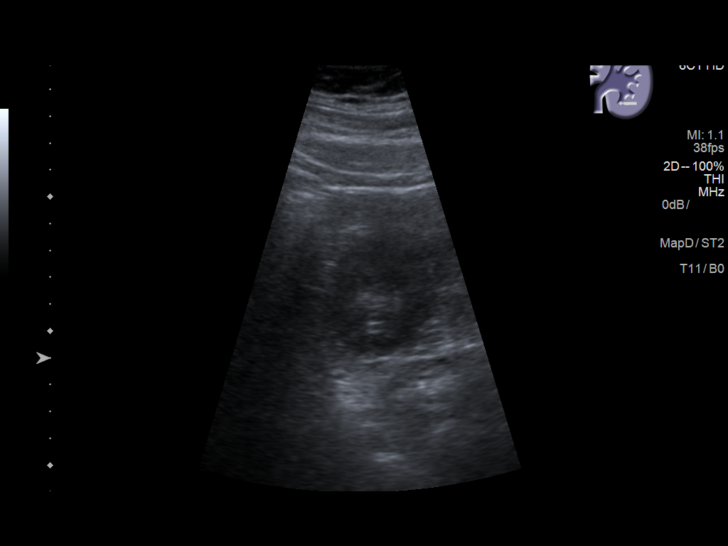
[im 38/46]
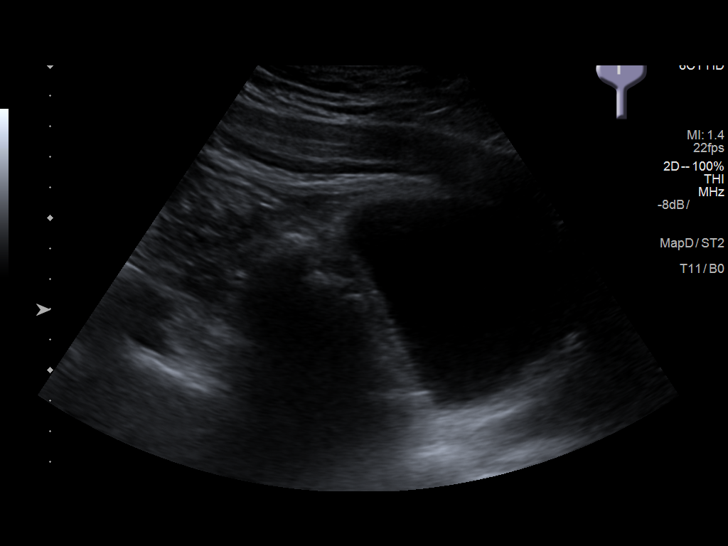
[im 42/46]
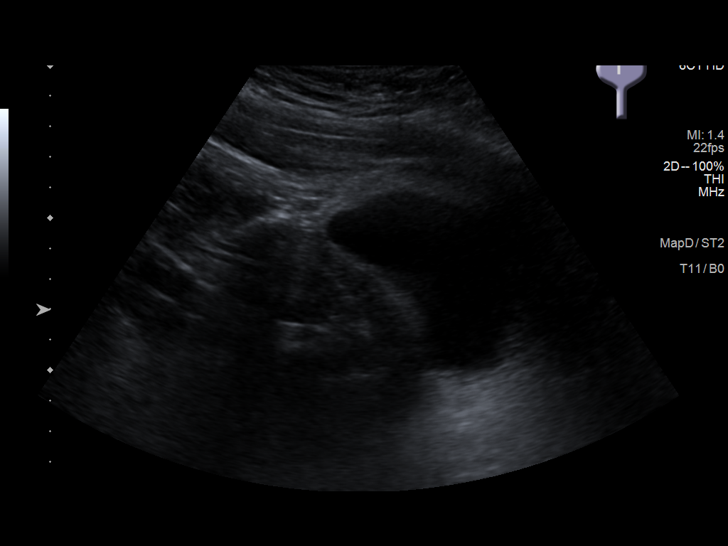
[im 46/46]
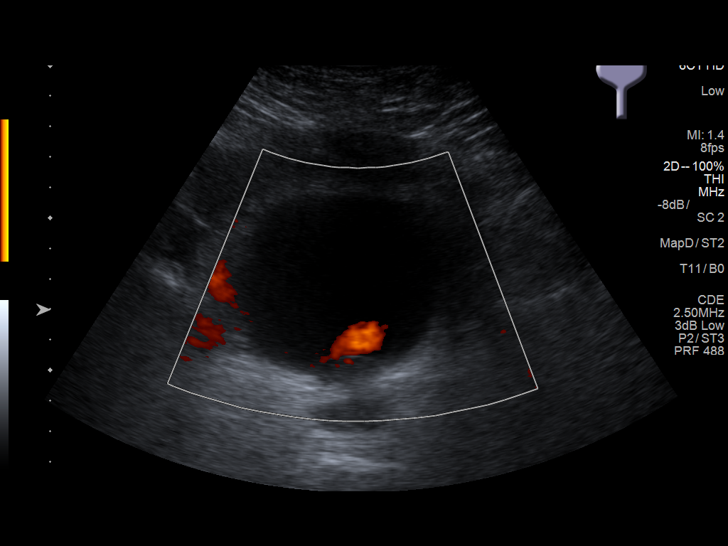

[14 of 25 positions shown; findings below may reference images not displayed]

FINDINGS: Right Kidney:

Length: 14.9 cm. Echogenicity within normal limits. No
hydronephrosis visualized. 5.8 cm simple cyst.

Left Kidney:

Length: 14.0 cm. Echogenicity within normal limits. No
hydronephrosis visualized. 2.2 cm simple cyst.

Bladder:

Appears normal for degree of bladder distention.
IMPRESSION: Bilateral simple renal cysts.  Exam is otherwise unremarkable.

## 2018-03-29 ENCOUNTER — Other Ambulatory Visit: Payer: Self-pay

## 2018-03-29 ENCOUNTER — Ambulatory Visit: Payer: BLUE CROSS/BLUE SHIELD | Admitting: Family Medicine

## 2018-03-29 ENCOUNTER — Encounter: Payer: Self-pay | Admitting: Family Medicine

## 2018-03-29 DIAGNOSIS — N029 Recurrent and persistent hematuria with unspecified morphologic changes: Secondary | ICD-10-CM

## 2018-03-29 DIAGNOSIS — I10 Essential (primary) hypertension: Secondary | ICD-10-CM | POA: Diagnosis not present

## 2018-03-29 DIAGNOSIS — D500 Iron deficiency anemia secondary to blood loss (chronic): Secondary | ICD-10-CM | POA: Insufficient documentation

## 2018-03-29 DIAGNOSIS — E559 Vitamin D deficiency, unspecified: Secondary | ICD-10-CM

## 2018-03-29 DIAGNOSIS — Z131 Encounter for screening for diabetes mellitus: Secondary | ICD-10-CM

## 2018-03-29 DIAGNOSIS — Z23 Encounter for immunization: Secondary | ICD-10-CM | POA: Diagnosis not present

## 2018-03-29 DIAGNOSIS — E7800 Pure hypercholesterolemia, unspecified: Secondary | ICD-10-CM | POA: Insufficient documentation

## 2018-03-29 MED ORDER — TELMISARTAN-HCTZ 80-25 MG PO TABS: 1.0000 | ORAL_TABLET | Freq: Every day | ORAL | 1 refills | Status: DC

## 2018-03-29 NOTE — Progress Notes (Signed)
Name: Angel Chase   MRN: [id number]    DOB: 20-Nov-1965   Date:03/29/2018       Progress Note  Subjective  Chief Complaint  Chief Complaint  Patient presents with  . Medication Refill  . Hypertension  . Hyperlipidemia    HPI  Hematuria: long history,  seen by Urologist in 2018 but lost to follow up , had multiple test, still has episode of hematuria, but anemia is now controlled with iron supplementation and also had iron infusion by Dr. Smith Robert Last labs within normal limits. Seen by nephrologist in the past biopsy negative done in 2000 at [facility]   Morbid obesity: he has gained 9 more pounds since last visit, discussed importance of portion control, discussed medications. He denies personal history of pancreatitis or family history of thyroid cancer. He skips breakfast, eats fast food for lunch, she eats dinner at home. Discussed packing lunch, we will check for pre-diabetes.   HTN: bp is at goal, taking medication as prescribed, no chest pain or palpitation, seen by cardiologist for abnormal ekg in the past. Feeling well.   Hyperlipidemia: not on statin therapy, we will recheck labs and depending on results we will start statin therapy  The 10-year ASCVD risk score Denman George DC Montez Hageman., et al., 2013) is: 7.5%   Values used to calculate the score:     Age: 53 years     Sex: Male     Is Non-Hispanic African American: Yes     Diabetic: No     Tobacco smoker: No     Systolic Blood Pressure: 118 mmHg     Is BP treated: Yes     HDL Cholesterol: 49 mg/dL     Total Cholesterol: 189 mg/dL  ED: he states symptoms were worse after cystoscopies but doing better now, he will call back for refills of cialis if needed.  Not sexually active lately.    Patient Active Problem List   Diagnosis Date Noted  . Recurrent hematuria 03/29/2018  . Iron deficiency anemia due to chronic blood loss 03/29/2018  . Pure hypercholesterolemia 03/29/2018  . History of kidney stones 05/17/2017  . Abnormal ECG  05/30/2016  . History of iron deficiency anemia 04/28/2016  . History of diverticulitis 03/04/2016  . Hyperlipidemia 01/27/2015  . Hematuria, gross 07/10/2014  . Failure of erection 07/10/2014  . Hypertension, benign 07/10/2014  . Morbid obesity (HCC) 10/09/2013  . Vitamin D deficiency 10/09/2013    Past Surgical History:  Procedure Laterality Date  . ANKLE SURGERY Left 03/07/2011   fracture from trauma  . COLONOSCOPY WITH PROPOFOL N/A 07/01/2016   Procedure: COLONOSCOPY WITH PROPOFOL;  Surgeon: Wyline Mood, MD;  Location: [facility] ENDOSCOPY;  Service: Endoscopy;  Laterality: N/A;  . CYSTOSCOPY W/ RETROGRADES Bilateral 06/01/2016   Procedure: CYSTOSCOPY WITH RETROGRADE PYELOGRAM;  Surgeon: Hildred Laser, MD;  Location: [facility] ORS;  Service: Urology;  Laterality: Bilateral;  . CYSTOSCOPY WITH STENT PLACEMENT Left 06/01/2016   Procedure: CYSTOSCOPY WITH STENT PLACEMENT;  Surgeon: Hildred Laser, MD;  Location: [facility] ORS;  Service: Urology;  Laterality: Left;  . ESOPHAGOGASTRODUODENOSCOPY (EGD) WITH PROPOFOL N/A 07/01/2016   Procedure: ESOPHAGOGASTRODUODENOSCOPY (EGD) WITH PROPOFOL;  Surgeon: Wyline Mood, MD;  Location: [facility] ENDOSCOPY;  Service: Endoscopy;  Laterality: N/A;  . FLEXIBLE SIGMOIDOSCOPY N/A 02/24/2017   Procedure: FLEXIBLE SIGMOIDOSCOPY;  Surgeon: Wyline Mood, MD;  Location: [facility] ENDOSCOPY;  Service: Gastroenterology;  Laterality: N/A;  . IR RADIOLOGIST EVAL & MGMT  06/16/2016  . IR RENAL SELECTIVE  UNI  INC S&I MOD SED  07/28/2016  . IR US GUIDE VASC ACCESS LEFT  07/28/2016  . STONE EXTRACTION WITH BASKET Left 06/01/2016   Procedure: STONE EXTRACTION WITH BASKET;  Surgeon: Hildred Laser, MD;  Location: [facility] ORS;  Service: Urology;  Laterality: Left;  . TONSILLECTOMY    . TONSILLECTOMY AND ADENOIDECTOMY  age 44  . URETEROSCOPY Left 06/01/2016   Procedure: URETEROSCOPY;  Surgeon: Hildred Laser, MD;  Location: [facility] ORS;  Service: Urology;  Laterality: Left;    Family  History  Problem Relation Age of Onset  . Breast cancer Mother   . Hypertension Mother   . Diabetes Father   . Hypertension Father   . Hypertension Sister   . Pancreatic disease Sister   . Diabetes Sister   . Hypertension Sister   . Hypertension Brother   . Diverticulosis Brother   . Prostate cancer Maternal Uncle   . Prostate cancer Maternal Uncle   . Dementia Maternal Grandmother   . Leukemia Maternal Grandfather   . Heart disease Paternal Grandmother   . Stomach cancer Paternal Grandfather   . Kidney cancer Neg Hx   . Bladder Cancer Neg Hx     Social History   Socioeconomic History  . Marital status: Married    Spouse name: Sherrie  . Number of children: 1  . Years of education: Not on file  . Highest education level: 12th grade  Occupational History  . Occupation: Education officer, community  Social Needs  . Financial resource strain: Not hard at all  . Food insecurity:    Worry: Never true    Inability: Never true  . Transportation needs:    Medical: No    Non-medical: No  Tobacco Use  . Smoking status: Never Smoker  . Smokeless tobacco: Never Used  Substance and Sexual Activity  . Alcohol use: No    Alcohol/week: 0.0 standard drinks  . Drug use: No  . Sexual activity: Yes    Partners: Female    Comment: wife hysterectomy   Lifestyle  . Physical activity:    Days per week: 1 day    Minutes per session: 120 min  . Stress: Not at all  Relationships  . Social connections:    Talks on phone: More than three times a week    Gets together: Twice a week    Attends religious service: More than 4 times per year    Active member of club or organization: Yes    Attends meetings of clubs or organizations: Never    Relationship status: Married  . Intimate partner violence:    Fear of current or ex partner: No    Emotionally abused: No    Physically abused: No    Forced sexual activity: No  Other Topics Concern  . Not on file  Social History Narrative  .  Not on file     Current Outpatient Medications:  .  ferrous sulfate 325 (65 FE) MG EC tablet, Take 325 mg by mouth daily with breakfast. (0600 & 1530), Disp: , Rfl:  .  Multiple Vitamin (MULTIVITAMIN WITH MINERALS) TABS tablet, Take 1 tablet by mouth daily at 6 (six) AM., Disp: , Rfl:  .  tadalafil (CIALIS) 20 MG tablet, Take 20 mg by mouth daily as needed for erectile dysfunction. , Disp: , Rfl:  .  telmisartan-hydrochlorothiazide (MICARDIS HCT) 80-25 MG tablet, Take 1 tablet by mouth daily., Disp: 90 tablet, Rfl: 1  No Known Allergies  I personally reviewed  active problem list, medication list, allergies, family history with the patient/caregiver today.   ROS  Constitutional: Negative for fever, positive  weight change.  Respiratory: Negative for cough and shortness of breath.   Cardiovascular: Negative for chest pain or palpitations.  Gastrointestinal: Negative for abdominal pain, no bowel changes.  Musculoskeletal: Negative for gait problem or joint swelling.  Skin: Negative for rash.  Neurological: Negative for dizziness or headache.  No other specific complaints in a complete review of systems (except as listed in HPI above).  Objective  Vitals:   03/29/18 1029  BP: 118/70  Pulse: 90  Resp: 16  Temp: 98 F (36.7 C)  TempSrc: Oral  SpO2: 98%  Weight: (!) 314 lb 8 oz (142.7 kg)  Height: 6' (1.829 m)    Body mass index is 42.65 kg/m.  Physical Exam  Constitutional: Patient appears well-developed and well-nourished. Obese  No distress.  HEENT: head atraumatic, normocephalic, pupils equal and reactive to light,  neck supple, throat within normal limits Cardiovascular: Normal rate, regular rhythm and normal heart sounds.  No murmur heard. No BLE edema. Pulmonary/Chest: Effort normal and breath sounds normal. No respiratory distress. Abdominal: Soft.  There is no tenderness. Psychiatric: Patient has a normal mood and affect. behavior is normal. Judgment and thought  content normal.  Recent Results (from the past 2160 hour(s))  Iron and TIBC     Status: None   Collection Time: 02/02/18  8:18 AM  Result Value Ref Range   Iron 52 45 - 182 ug/dL   TIBC 161 096 - 045 ug/dL   Saturation Ratios 18 17.9 - 39.5 %   UIBC 240 ug/dL    Comment: Performed at [facility], [address] [place], [place] [address]  Ferritin     Status: None   Collection Time: 02/02/18  8:18 AM  Result Value Ref Range   Ferritin 31 24 - 336 ng/mL    Comment: Performed at [facility], [address] [place], [place] [address]  CBC     Status: None   Collection Time: 02/02/18  8:18 AM  Result Value Ref Range   WBC 7.8 4.0 - 10.5 K/uL   RBC 4.84 4.22 - 5.81 MIL/uL   Hemoglobin 13.5 13.0 - 17.0 g/dL   HCT 82.9 56.2 - 13.0 %   MCV 85.3 80.0 - 100.0 fL   MCH 27.9 26.0 - 34.0 pg   MCHC 32.7 30.0 - 36.0 g/dL   RDW 86.5 78.4 - 69.6 %   Platelets 205 150 - 400 K/uL   nRBC 0.0 0.0 - 0.2 %    Comment: Performed at [facility], [address] [place], [place] [address]     PHQ2/9: Depression screen [facility] 2/9 03/29/2018 05/17/2017 01/31/2017 11/01/2016 08/01/2016  Decreased Interest 0 0 0 0 0  Down, Depressed, Hopeless 0 0 0 0 0  PHQ - 2 Score 0 0 0 0 0  Altered sleeping 0 - - - -  Tired, decreased energy 0 - - - -  Change in appetite 0 - - - -  Feeling bad or failure about yourself  0 - - - -  Trouble concentrating 0 - - - -  Moving slowly or fidgety/restless 0 - - - -  Suicidal thoughts 0 - - - -  PHQ-9 Score 0 - - - -     Fall Risk: Fall Risk  03/29/2018 05/17/2017 05/17/2017 01/31/2017 11/01/2016  Falls in the past year? 0 Yes Yes No  No  Number falls in past yr: 0 2 or more 2 or more - -  Injury with Fall? 0 Yes Yes - -  Risk Factor Category  - High Fall Risk High Fall Risk - -     Assessment & Plan   1. Morbid obesity (HCC)  - Insulin, Free (Bioactive)  2. Flu vaccine need  Flu vaccine  3. Essential hypertension  -  telmisartan-hydrochlorothiazide (MICARDIS HCT) 80-25 MG tablet; Take 1 tablet by mouth daily.  Dispense: 90 tablet; Refill: 1 - COMPLETE METABOLIC PANEL WITH GFR  4. Pure hypercholesterolemia  - Lipid panel  5. Vitamin D deficiency  Advised to add vitamin D otc 1000 units daily   6. Iron deficiency anemia due to chronic blood loss  Seeing Dr. Smith Robert   7. Recurrent hematuria  He needs to go back to Urologist   8. Diabetes mellitus screening  - Hemoglobin A1c - Insulin, Free (Bioactive)

## 2018-04-05 LAB — COMPLETE METABOLIC PANEL WITH GFR
AG Ratio: 1.6 (calc) (ref 1.0–2.5)
ALT: 19 U/L (ref 9–46)
AST: 15 U/L (ref 10–35)
Albumin: 4.1 g/dL (ref 3.6–5.1)
Alkaline phosphatase (APISO): 72 U/L (ref 35–144)
BUN: 9 mg/dL (ref 7–25)
CO2: 30 mmol/L (ref 20–32)
Calcium: 9.6 mg/dL (ref 8.6–10.3)
Chloride: 100 mmol/L (ref 98–110)
Creat: 0.94 mg/dL (ref 0.70–1.33)
GFR, Est African American: 109 mL/min/{1.73_m2} (ref >=60)
GFR, Est Non African American: 94 mL/min/{1.73_m2} (ref >=60)
GLOBULIN: 2.6 g/dL (ref 1.9–3.7)
Glucose, Bld: 87 mg/dL (ref 65–99)
Potassium: 3.8 mmol/L (ref 3.5–5.3)
Sodium: 138 mmol/L (ref 135–146)
Total Bilirubin: 0.6 mg/dL (ref 0.2–1.2)
Total Protein: 6.7 g/dL (ref 6.1–8.1)

## 2018-04-05 LAB — INSULIN, FREE (BIOACTIVE): Insulin, Free: 16.1 u[IU]/mL — ABNORMAL HIGH (ref 1.5–14.9)

## 2018-04-05 LAB — LIPID PANEL
Cholesterol: 180 mg/dL (ref <200)
HDL: 48 mg/dL (ref >=40)
LDL CHOLESTEROL (CALC): 116 mg/dL — AB
Non-HDL Cholesterol (Calc): 132 mg/dL (calc) — ABNORMAL HIGH (ref <130)
Total CHOL/HDL Ratio: 3.8 (calc) (ref <5.0)
Triglycerides: 69 mg/dL (ref <150)

## 2018-04-05 LAB — HEMOGLOBIN A1C
Hgb A1c MFr Bld: 5.5 % of total Hgb (ref <5.7)
Mean Plasma Glucose: 111 (calc)
eAG (mmol/L): 6.2 (calc)

## 2018-04-11 IMAGING — XA IR ANGIO/PELVIC SELECTIVE EA VESSEL
4 series · 12 of 24 positions shown · IV contrast (IODINE)
Comparison: none

INDICATION: 51-year-old male with a history of hematuria, confirmed to be
left-sided

[Series 3: body 4 care · 1 of 76 frames shown (1 of 3)]
[frame 23/76]
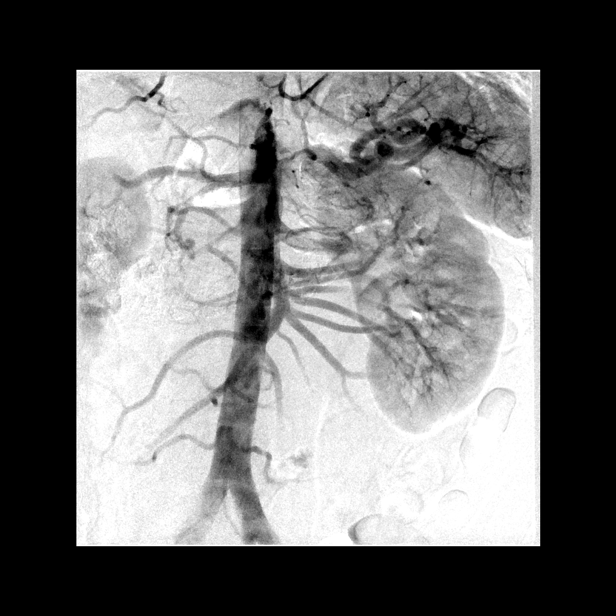

[Series 6: body 4 care · 2 of 107 frames shown (2 of 3)]
[frame 17/107]
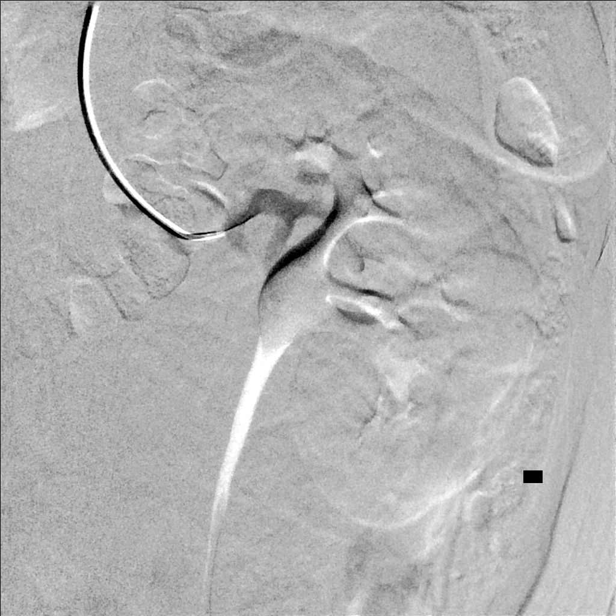
[frame 91/107]
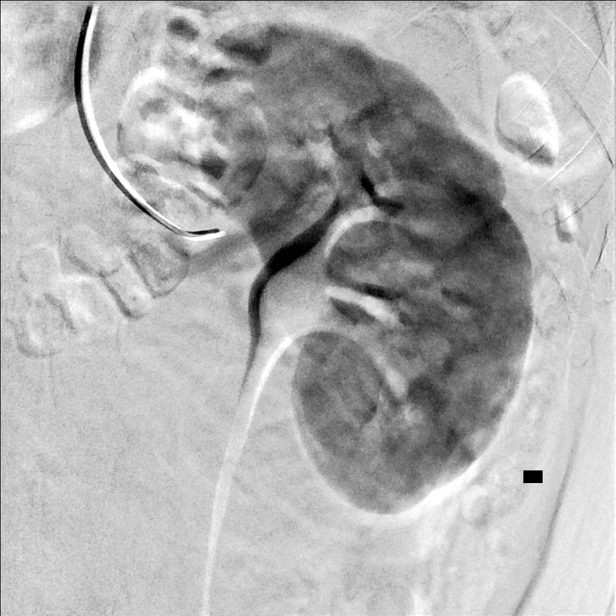

[Series 7: body 4 care · 1 of 80 frames shown (3 of 3)]
[frame 52/80]
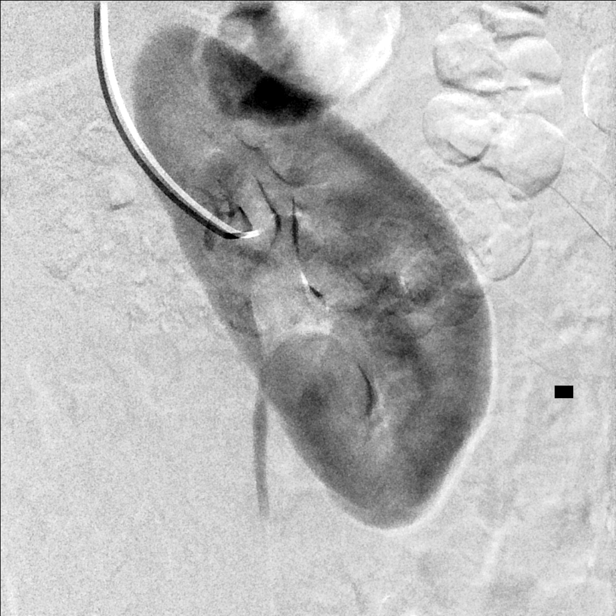

[Series 300: dsa body · 8 of 52 slices shown]
[im 3/52]
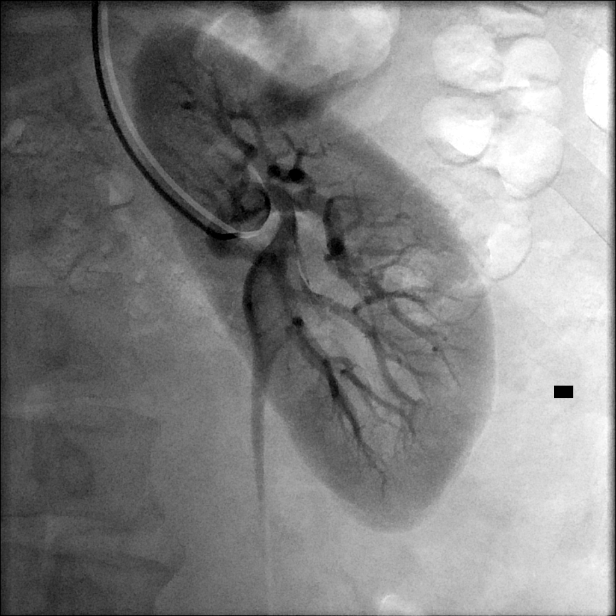
[im 8/52]
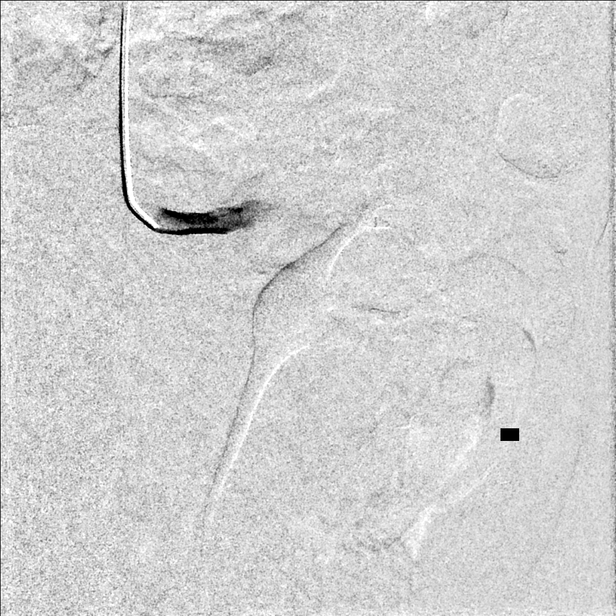
[im 16/52]
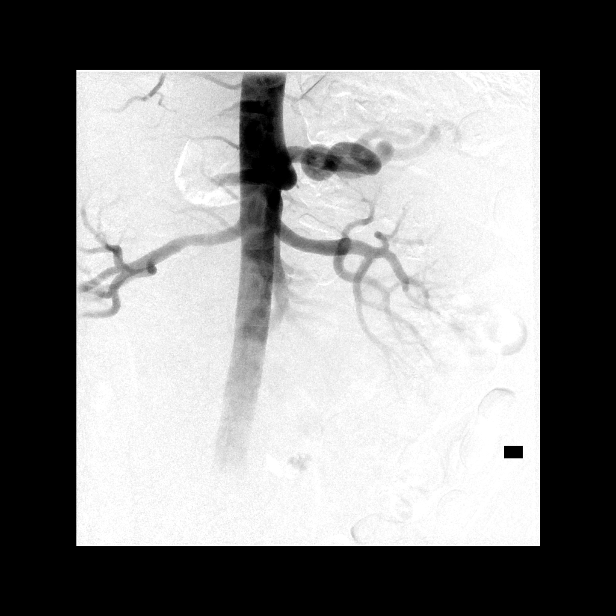
[im 23/52]
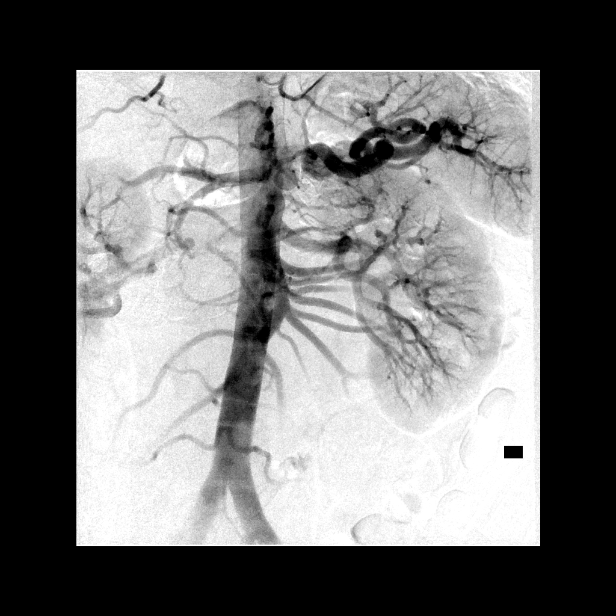
[im 31/52]
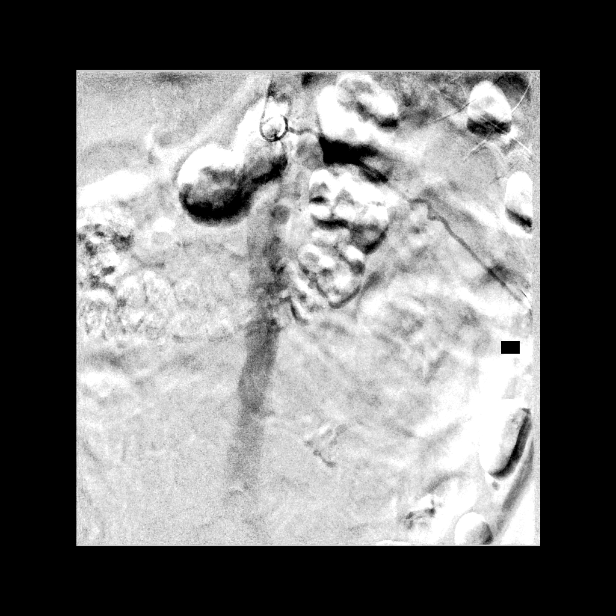
[im 36/52]
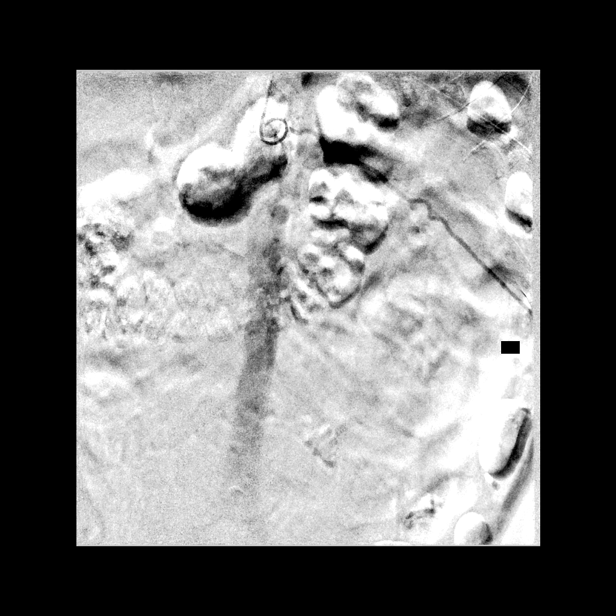
[im 44/52]
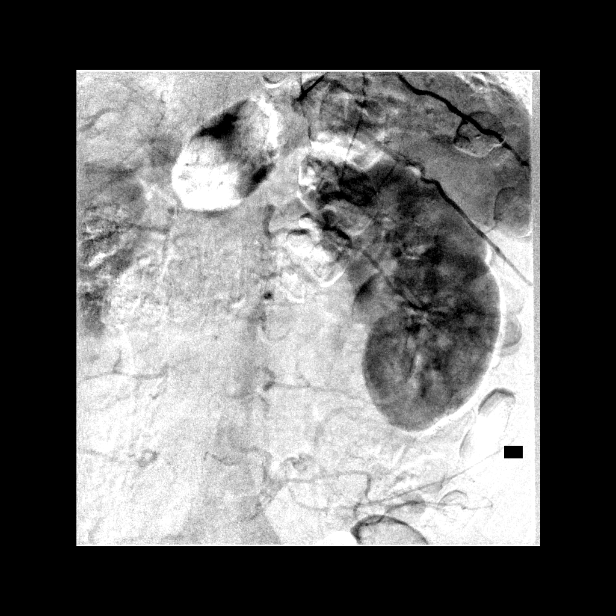
[im 52/52]
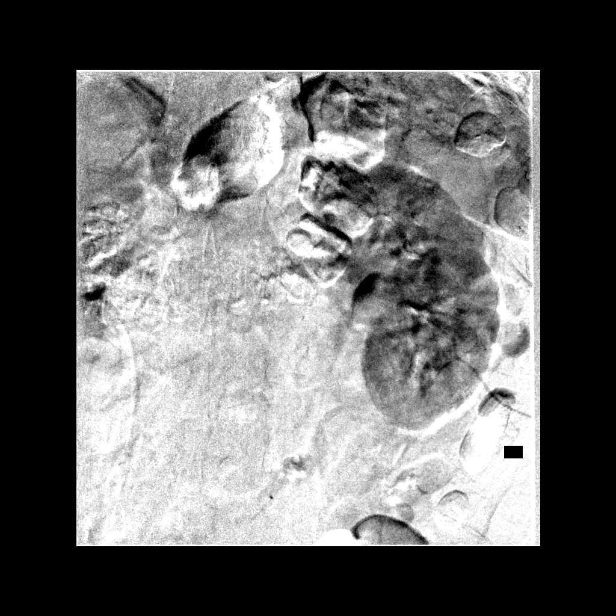

[12 of 24 positions shown; findings below may reference images not displayed]

EXAM:
ULTRASOUND GUIDED ACCESS LEFT RADIAL ARTERY

AORTIC ANGIOGRAM

LEFT RENAL ANGIOGRAM

MEDICATIONS:
4333 units heparin intra- arterial, 2.5 mg verapamil, intra-
arterial, 200 mcg nitroglycerin intraarterial.

ANESTHESIA/SEDATION:
Moderate (conscious) sedation was employed during this procedure. A
total of Versed 2.0 mg and Fentanyl 100 mcg was administered
intravenously.

Moderate Sedation Time: 58 minutes. The patient's level of
consciousness and vital signs were monitored continuously by
radiology nursing throughout the procedure under my direct
supervision.

CONTRAST:  One hundred fifty [id number]

FLUOROSCOPY TIME:  Fluoroscopy Time: 6 minutes 12 seconds (1,075
mGy).

COMPLICATIONS:
None



Necchi test at the left hand was performed, confirming adequate
perfusion of the hand by both radial and ulnar artery.

Ultrasound survey of the left radial artery was performed with
images stored and sent to PACs. 3 mm -4 mm diameter was confirmed.

A micropuncture needle was used access the left radial artery under
ultrasound. With excellent arterial blood flow returned, and an .018
micro wire was passed through the needle, observed enter the radial
artery under ultrasound. The needle was removed, and 5 French glide
sheath slender was placed over the wire. The inner dilator and wire
were removed, and radial cocktail was infused through the sheath.

Benson wire was navigated to the brachial artery, over which 125 cm
5 French angled catheter was advanced. Bentson wire an the angled
catheter were advanced into the descending thoracic aorta. Once the
catheter was at the level of diaphragm, exchange Rosen wire was
placed and the angled catheter were exchanged for a 100 cm pigtail
catheter.

Pigtail catheter was position at the level the diaphragm and aortic
angiogram was performed.

The image intensifier was then repositioned over the pelvic region
and pelvic angiogram was performed.

Rosen wire was then passed through the pigtail catheter and
exchanged for 125 cm angled catheter. Using a Glidewire, angled
catheter was positioned in the main left renal artery.

Left renal angiogram was then performed with multiple planes.

Catheter and wires were removed. The left radial artery sheath was
removed with placement of TR band.

Patient tolerated the procedure well and remained hemodynamically
stable throughout.

No complications were encountered and no significant blood loss.
FINDINGS: Ultrasound survey of the left radial artery demonstrates 3.3 mm
artery.

Necchi test demonstrates adequate perfusion of the hand with both
radial artery and ulnar artery compression.

Aortic angiogram demonstrates patent mesenteric vessels and
bilateral single renal arteries.

Aortic angiogram only partially images the right kidney, given the
field-of-view. There is asymmetric washout of the corticomedullary
phase of the visualize right kidney versus the left. The left
appears somewhat delayed.

On dedicated renal angiogram of the left renal artery, there are no
stenoses, occlusions, string of beads configuration. No venous
shunting identified. No parenchymal lesion is identified. The entire
parenchyma is opacified from single left renal artery with no
collateral flow identified.

There is indistinct cortico medullary differentiation on the
parenchymal phase of the angiogram, with questionable slow flow
through distal vasculature. The indistinct and patchy parenchymal
phase is most pronounced at the superior aspect of the left kidney.
IMPRESSION: Status post aortic, pelvic, and left renal angiogram for
investigation of left-sided hematuria with no arteriovenous
malformation, venous shunting, or parenchymal lesion identified.

There is indistinct corticomedullary perfusion of the left kidney
with questionable slow flow within distal vasculature. This finding
could represent underlying medical renal condition such as chronic
glomerulonephritis, or polyarteritis nodosa.

## 2018-05-07 ENCOUNTER — Ambulatory Visit: Payer: BLUE CROSS/BLUE SHIELD | Admitting: Urology

## 2018-05-08 ENCOUNTER — Other Ambulatory Visit: Payer: Self-pay

## 2018-05-08 ENCOUNTER — Telehealth: Payer: Self-pay | Admitting: Urology

## 2018-05-08 ENCOUNTER — Telehealth (INDEPENDENT_AMBULATORY_CARE_PROVIDER_SITE_OTHER): Payer: BLUE CROSS/BLUE SHIELD | Admitting: Urology

## 2018-05-08 DIAGNOSIS — R319 Hematuria, unspecified: Secondary | ICD-10-CM | POA: Diagnosis not present

## 2018-05-08 NOTE — Progress Notes (Addendum)
Virtual Visit Note  I connected with Angel Chase on 05/08/18 at  9:30 AM EDT by [email or web address] virtual video app and verified that I am speaking with the correct person using two identifiers.   I discussed the limitations, risks, security and privacy concerns of performing an evaluation and management service by video and the availability of in person appointments. We discussed the impact of the COVID-19 on the healthcare system, and the importance of social distancing and reducing patient and provider exposure. I also discussed with the patient that there may be a patient responsible charge related to this service. The patient expressed understanding and agreed to proceed.  Reason for visit: Recurrent hematuria  History of Present Illness: Angel Chase is a 53 year old African-American male with morbid obesity and hypertension that was previously followed by Dr. Sharee Holster for recurrent hematuria.  He has a complex urologic history.  To briefly summarize, he reports 25 to 30 years of persistent pink to red urine with small clots.  This has required iron supplementation however no blood transfusions.  He is completely asymptomatic and he denies any flank pain, urinary symptoms, or retention from clots.  He underwent extensive work-up previously including a negative renal biopsy in 2000 at [facility], cystoscopy(noted to have localizing left sided hematuria from left ureteral orifice), retrograde pyelograms, left diagnostic ureteroscopy without any abnormal findings, and 73mm renal stone removal with Dr. Sherryl Barters in May 2018, as well as left sided angiography with interventional radiology in July 2018 which was essentially negative.  There was a possibility of a chronic nephritis or vasculitis, but no AV malformation or active extravasation.  He reports ongoing intermittent pink to red hematuria with occasional very small clots, smaller than a pencil eraser.  He overall is doing well, and is not bothered by his hematuria.  He  denies any urinary symptoms.  He is a non-smoker.   Assessment and Plan: 53 year old male with 20+ year history of hematuria localizing to the left kidney, with history of negative left diagnostic ureteroscopy in 2018, as well as negative left-sided angiography with interventional radiology, as well as a history of a negative renal biopsy in 2000.  He is completely asymptomatic, and is not requiring blood transfusions.  We discussed options at length including repeat imaging or work-up with cystoscopy, renal biopsy and nephrology consult, or repeat evaluation by interventional radiology.  In the setting of his chronic symptoms that have not worsened, he would like to hold off on any further investigation at this time.  This is certainly very reasonable.  We discussed at length that if he has worsening hematuria, enlarging clots, or difficulty urinating, he needs to call our office immediately.  Follow Up: -Follow-up in 1 year for symptom check, repeat nephrology consult if/when patient amenable   I discussed the assessment and treatment plan with the patient. The patient was provided an opportunity to ask questions and all were answered. The patient agreed with the plan and demonstrated an understanding of the instructions.   The patient was advised to call back or seek an in-person evaluation if the symptoms worsen or if the condition fails to improve as anticipated.  I provided 15 minutes of non-face-to-face time during this encounter.   Sondra Come, MD

## 2018-05-08 NOTE — Telephone Encounter (Signed)
-----   Message from Sondra Come, MD sent at 05/08/2018  9:42 AM EDT ----- Regarding: follow up RTC one year for symptom check  Legrand Rams, MD 05/08/2018

## 2018-05-08 NOTE — Telephone Encounter (Signed)
App made and mailed to patient ° °Angel Chase °

## 2018-05-21 ENCOUNTER — Encounter: Payer: BLUE CROSS/BLUE SHIELD | Admitting: Family Medicine

## 2018-06-04 ENCOUNTER — Encounter (INDEPENDENT_AMBULATORY_CARE_PROVIDER_SITE_OTHER): Payer: Self-pay

## 2018-06-04 ENCOUNTER — Inpatient Hospital Stay: Payer: BLUE CROSS/BLUE SHIELD | Attending: Oncology

## 2018-06-04 ENCOUNTER — Other Ambulatory Visit: Payer: Self-pay

## 2018-06-04 DIAGNOSIS — E559 Vitamin D deficiency, unspecified: Secondary | ICD-10-CM | POA: Diagnosis not present

## 2018-06-04 DIAGNOSIS — G4733 Obstructive sleep apnea (adult) (pediatric): Secondary | ICD-10-CM | POA: Diagnosis not present

## 2018-06-04 DIAGNOSIS — Z79899 Other long term (current) drug therapy: Secondary | ICD-10-CM | POA: Diagnosis not present

## 2018-06-04 DIAGNOSIS — D508 Other iron deficiency anemias: Secondary | ICD-10-CM | POA: Diagnosis not present

## 2018-06-04 DIAGNOSIS — I10 Essential (primary) hypertension: Secondary | ICD-10-CM | POA: Insufficient documentation

## 2018-06-04 DIAGNOSIS — E669 Obesity, unspecified: Secondary | ICD-10-CM | POA: Diagnosis not present

## 2018-06-04 DIAGNOSIS — Z803 Family history of malignant neoplasm of breast: Secondary | ICD-10-CM | POA: Insufficient documentation

## 2018-06-04 DIAGNOSIS — D500 Iron deficiency anemia secondary to blood loss (chronic): Secondary | ICD-10-CM

## 2018-06-04 LAB — IRON AND TIBC
Iron: 72 ug/dL (ref 45–182)
Saturation Ratios: 23 % (ref 17.9–39.5)
TIBC: 318 ug/dL (ref 250–450)
UIBC: 246 ug/dL

## 2018-06-04 LAB — CBC
HCT: 40.1 % (ref 39.0–52.0)
Hemoglobin: 13.2 g/dL (ref 13.0–17.0)
MCH: 27.8 pg (ref 26.0–34.0)
MCHC: 32.9 g/dL (ref 30.0–36.0)
MCV: 84.4 fL (ref 80.0–100.0)
Platelets: 192 10*3/uL (ref 150–400)
RBC: 4.75 MIL/uL (ref 4.22–5.81)
RDW: 13.1 % (ref 11.5–15.5)
WBC: 6.3 10*3/uL (ref 4.0–10.5)
nRBC: 0 % (ref 0.0–0.2)

## 2018-06-04 LAB — FERRITIN: Ferritin: 21 ng/mL — ABNORMAL LOW (ref 24–336)

## 2018-06-05 ENCOUNTER — Encounter: Payer: Self-pay | Admitting: Oncology

## 2018-06-05 ENCOUNTER — Inpatient Hospital Stay (HOSPITAL_BASED_OUTPATIENT_CLINIC_OR_DEPARTMENT_OTHER): Payer: BLUE CROSS/BLUE SHIELD | Admitting: Oncology

## 2018-06-05 ENCOUNTER — Inpatient Hospital Stay: Payer: BLUE CROSS/BLUE SHIELD

## 2018-06-05 DIAGNOSIS — Z79899 Other long term (current) drug therapy: Secondary | ICD-10-CM

## 2018-06-05 DIAGNOSIS — D508 Other iron deficiency anemias: Secondary | ICD-10-CM

## 2018-06-05 DIAGNOSIS — D500 Iron deficiency anemia secondary to blood loss (chronic): Secondary | ICD-10-CM

## 2018-06-05 NOTE — Progress Notes (Signed)
Pt feels good. He has energy-he works regular shifts and does get tired but it is normal after a a days work. Still has blood in urine as he has always had in past. No pain.

## 2018-06-08 NOTE — Progress Notes (Signed)
I connected with Angel Chase on 06/08/18 at 11:30 AM EDT by video enabled telemedicine visit and verified that I am speaking with the correct person using two identifiers.   I discussed the limitations, risks, security and privacy concerns of performing an evaluation and management service by telemedicine and the availability of in-person appointments. I also discussed with the patient that there may be a patient responsible charge related to this service. The patient expressed understanding and agreed to proceed.  Other persons participating in the visit and their role in the encounter:  nonw  Patient's location:  home Provider's location:  Home  Diagnosis- iron deficiency anemia likely due to hematuria  Chief Complaint:  Routine /u of iron deficiency anemia  History of present illness: Patient is a 53 year old male who has a long standing history of hematuria. He has been seen by both nephrology and urology in the past to evaluate his hematuria and states he underwent extensive testing but no cause for hematuria was found. He has persistent hematuria all along sometimes tea colored sometimes bright red but was never anemic in the past. No family h/o bleeding disorders. No gum bleeds, nose bleeds or bleeding in his stool. He has had ankle surgery in the past without any bleeding issues.  He has been referred to us for evaluation and management of anemia. Most recent CBC from 04/28/2016 showed white count of 6.8, H&H of 10.9/35.9 with an MCV of 72.5 and a platelet count of 295. Iron studies revealed a low serum iron of 18 and low iron saturation of 4%. TIBC was high normal at 407. Ferritin was low at 6. Patient is yet to see urology for hematuria. [facility]. He has never tried oral iron  Also seen by Dr. Tobi BastosAnna from GI and underwent EGD and colonoscopy which was unremarkable  He received 2 doses of Feraheme in June 2019  Interval history patient  continues to have on and off hematuria.  He was recently seen by Dr. Richardo HanksSninsky and urology work-up essentially unremarkable and he was offered repeat work-up including cystoscopy, kidney biopsy and/or nephrology given the stability of his symptoms he did not desire further work-up at this time.  Energy levels are good and he denies any complaints at this time   Review of Systems  Constitutional: Negative for chills, fever, malaise/fatigue and weight loss.  HENT: Negative for congestion, ear discharge and nosebleeds.   Eyes: Negative for blurred vision.  Respiratory: Negative for cough, hemoptysis, sputum production, shortness of breath and wheezing.   Cardiovascular: Negative for chest pain, palpitations, orthopnea and claudication.  Gastrointestinal: Negative for abdominal pain, blood in stool, constipation, diarrhea, heartburn, melena, nausea and vomiting.  Genitourinary: Negative for dysuria, flank pain, frequency, hematuria and urgency.  Musculoskeletal: Negative for back pain, joint pain and myalgias.  Skin: Negative for rash.  Neurological: Negative for dizziness, tingling, focal weakness, seizures, weakness and headaches.  Endo/Heme/Allergies: Does not bruise/bleed easily.  Psychiatric/Behavioral: Negative for depression and suicidal ideas. The patient does not have insomnia.     No Known Allergies  Past Medical History:  Diagnosis Date  . Anemia   . ED (erectile dysfunction)   . EKG abnormalities   . Hematuria   . Hematuria    x 20 yr per pt  . History of kidney stones   . Hypertension   . Obesity   . Obstructive sleep apnea   . Vitamin D deficiency     Past Surgical History:  Procedure Laterality Date  . ANKLE SURGERY Left 03/07/2011   fracture from trauma  . COLONOSCOPY WITH PROPOFOL N/A 07/01/2016   Procedure: COLONOSCOPY WITH PROPOFOL;  Surgeon: Wyline Mood, MD;  Location: [facility] ENDOSCOPY;  Service: Endoscopy;  Laterality: N/A;  . CYSTOSCOPY W/ RETROGRADES Bilateral  06/01/2016   Procedure: CYSTOSCOPY WITH RETROGRADE PYELOGRAM;  Surgeon: Hildred Laser, MD;  Location: [facility] ORS;  Service: Urology;  Laterality: Bilateral;  . CYSTOSCOPY WITH STENT PLACEMENT Left 06/01/2016   Procedure: CYSTOSCOPY WITH STENT PLACEMENT;  Surgeon: Hildred Laser, MD;  Location: [facility] ORS;  Service: Urology;  Laterality: Left;  . ESOPHAGOGASTRODUODENOSCOPY (EGD) WITH PROPOFOL N/A 07/01/2016   Procedure: ESOPHAGOGASTRODUODENOSCOPY (EGD) WITH PROPOFOL;  Surgeon: Wyline Mood, MD;  Location: [facility] ENDOSCOPY;  Service: Endoscopy;  Laterality: N/A;  . FLEXIBLE SIGMOIDOSCOPY N/A 02/24/2017   Procedure: FLEXIBLE SIGMOIDOSCOPY;  Surgeon: Wyline Mood, MD;  Location: [facility] ENDOSCOPY;  Service: Gastroenterology;  Laterality: N/A;  . IR RADIOLOGIST EVAL & MGMT  06/16/2016  . IR RENAL SELECTIVE  UNI INC S&I MOD SED  07/28/2016  . IR US GUIDE VASC ACCESS LEFT  07/28/2016  . STONE EXTRACTION WITH BASKET Left 06/01/2016   Procedure: STONE EXTRACTION WITH BASKET;  Surgeon: Hildred Laser, MD;  Location: [facility] ORS;  Service: Urology;  Laterality: Left;  . TONSILLECTOMY    . TONSILLECTOMY AND ADENOIDECTOMY  age 23  . URETEROSCOPY Left 06/01/2016   Procedure: URETEROSCOPY;  Surgeon: Hildred Laser, MD;  Location: [facility] ORS;  Service: Urology;  Laterality: Left;    Social History   Socioeconomic History  . Marital status: Married    Spouse name: Sherrie  . Number of children: 1  . Years of education: Not on file  . Highest education level: 12th grade  Occupational History  . Occupation: Education officer, community  Social Needs  . Financial resource strain: Not hard at all  . Food insecurity:    Worry: Never true    Inability: Never true  . Transportation needs:    Medical: No    Non-medical: No  Tobacco Use  . Smoking status: Never Smoker  . Smokeless tobacco: Never Used  Substance and Sexual Activity  . Alcohol use: No    Alcohol/week: 0.0 standard drinks  . Drug use: No  .  Sexual activity: Yes    Partners: Female    Comment: wife hysterectomy   Lifestyle  . Physical activity:    Days per week: 1 day    Minutes per session: 120 min  . Stress: Not at all  Relationships  . Social connections:    Talks on phone: More than three times a week    Gets together: Twice a week    Attends religious service: More than 4 times per year    Active member of club or organization: Yes    Attends meetings of clubs or organizations: Never    Relationship status: Married  . Intimate partner violence:    Fear of current or ex partner: No    Emotionally abused: No    Physically abused: No    Forced sexual activity: No  Other Topics Concern  . Not on file  Social History Narrative  . Not on file    Family History  Problem Relation Age of Onset  . Breast cancer Mother   . Hypertension Mother   . Diabetes Father   . Hypertension Father   . Hypertension Sister   . Pancreatic disease Sister   . Diabetes Sister   .  Hypertension Sister   . Hypertension Brother   . Diverticulosis Brother   . Prostate cancer Maternal Uncle   . Prostate cancer Maternal Uncle   . Dementia Maternal Grandmother   . Leukemia Maternal Grandfather   . Heart disease Paternal Grandmother   . Stomach cancer Paternal Grandfather   . Kidney cancer Neg Hx   . Bladder Cancer Neg Hx      Current Outpatient Medications:  .  ferrous sulfate 325 (65 FE) MG EC tablet, Take 325 mg by mouth daily with breakfast. (0600 & 1530), Disp: , Rfl:  .  Multiple Vitamin (MULTIVITAMIN WITH MINERALS) TABS tablet, Take 1 tablet by mouth daily at 6 (six) AM., Disp: , Rfl:  .  tadalafil (CIALIS) 20 MG tablet, Take 20 mg by mouth daily as needed for erectile dysfunction. , Disp: , Rfl:  .  telmisartan-hydrochlorothiazide (MICARDIS HCT) 80-25 MG tablet, Take 1 tablet by mouth daily., Disp: 90 tablet, Rfl: 1  No results found.  No images are attached to the encounter.   CMP Latest Ref Rng & Units 03/29/2018   Glucose 65 - 99 mg/dL 87  BUN 7 - 25 mg/dL 9  Creatinine 3.25 - 4.98 mg/dL 2.64  Sodium 158 - 309 mmol/L 138  Potassium 3.5 - 5.3 mmol/L 3.8  Chloride 98 - 110 mmol/L 100  CO2 20 - 32 mmol/L 30  Calcium 8.6 - 10.3 mg/dL 9.6  Total Protein 6.1 - 8.1 g/dL 6.7  Total Bilirubin 0.2 - 1.2 mg/dL 0.6  Alkaline Phos 40 - 115 U/L -  AST 10 - 35 U/L 15  ALT 9 - 46 U/L 19   CBC Latest Ref Rng & Units 06/04/2018  WBC 4.0 - 10.5 K/uL 6.3  Hemoglobin 13.0 - 17.0 g/dL 40.7  Hematocrit 68.0 - 52.0 % 40.1  Platelets 150 - 400 K/uL 192     Observation/objective: Appears in no acute distress of a video visit today.  Breathing is nonlabored  Assessment and plan: Patient is a 53 year old male with a history of iron deficiency anemia possibly secondary to hematuria  Patient is not anemic at this time and his H&H is 13.2/40.1.  His ferritin levels are low at 21 but he has always had low ferritin levels which have not significantly improved despite giving him IV iron in the past.  We discussed proceeding with Feraheme at this time versus waiting to follow-up on his hemoglobin to decide if he needs IV iron.  Patient prefers to wait  Follow-up instructions: Repeat CBC ferritin iron studies in 3 in 6 months and I will see him back in 6 months  I discussed the assessment and treatment plan with the patient. The patient was provided an opportunity to ask questions and all were answered. The patient agreed with the plan and demonstrated an understanding of the instructions.   The patient was advised to call back or seek an in-person evaluation if the symptoms worsen or if the condition fails to improve as anticipated.   Visit Diagnosis: 1. Iron deficiency anemia due to chronic blood loss     Dr. Owens Shark, MD, MPH [facility] [phone] 06/08/2018 8:59 AM

## 2018-07-26 ENCOUNTER — Ambulatory Visit (INDEPENDENT_AMBULATORY_CARE_PROVIDER_SITE_OTHER): Payer: BC Managed Care – PPO | Admitting: Family Medicine

## 2018-07-26 ENCOUNTER — Encounter: Payer: Self-pay | Admitting: Family Medicine

## 2018-07-26 ENCOUNTER — Other Ambulatory Visit: Payer: Self-pay

## 2018-07-26 VITALS — BP 126/80 | HR 82 | Temp 97.1°F | Resp 16 | Ht 71.75 in | Wt 299.5 lb

## 2018-07-26 DIAGNOSIS — Z125 Encounter for screening for malignant neoplasm of prostate: Secondary | ICD-10-CM

## 2018-07-26 DIAGNOSIS — I10 Essential (primary) hypertension: Secondary | ICD-10-CM

## 2018-07-26 DIAGNOSIS — Z1159 Encounter for screening for other viral diseases: Secondary | ICD-10-CM | POA: Diagnosis not present

## 2018-07-26 DIAGNOSIS — E8881 Metabolic syndrome: Secondary | ICD-10-CM | POA: Diagnosis not present

## 2018-07-26 DIAGNOSIS — Z1211 Encounter for screening for malignant neoplasm of colon: Secondary | ICD-10-CM | POA: Diagnosis not present

## 2018-07-26 DIAGNOSIS — B351 Tinea unguium: Secondary | ICD-10-CM

## 2018-07-26 DIAGNOSIS — B353 Tinea pedis: Secondary | ICD-10-CM

## 2018-07-26 DIAGNOSIS — L918 Other hypertrophic disorders of the skin: Secondary | ICD-10-CM

## 2018-07-26 DIAGNOSIS — Z0000 Encounter for general adult medical examination without abnormal findings: Secondary | ICD-10-CM

## 2018-07-26 DIAGNOSIS — N029 Recurrent and persistent hematuria with unspecified morphologic changes: Secondary | ICD-10-CM

## 2018-07-26 MED ORDER — TERBINAFINE HCL 250 MG PO TABS: 250.0000 mg | ORAL_TABLET | Freq: Every day | ORAL | 0 refills | Status: DC

## 2018-07-26 NOTE — Patient Instructions (Signed)
Preventive Care 40-51 Years Old, Male Preventive care refers to lifestyle choices and visits with your health care provider that can promote health and wellness. This includes:  A yearly physical exam. This is also called an annual well check.  Regular dental and eye exams.  Immunizations.  Screening for certain conditions.  Healthy lifestyle choices, such as eating a healthy diet, getting regular exercise, not using drugs or products that contain nicotine and tobacco, and limiting alcohol use. What can I expect for my preventive care visit? Physical exam Your health care provider will check:  Height and weight. These may be used to calculate body mass index (BMI), which is a measurement that tells if you are at a healthy weight.  Heart rate and blood pressure.  Your skin for abnormal spots. Counseling Your health care provider may ask you questions about:  Alcohol, tobacco, and drug use.  Emotional well-being.  Home and relationship well-being.  Sexual activity.  Eating habits.  Work and work environment. What immunizations do I need?  Influenza (flu) vaccine  This is recommended every year. Tetanus, diphtheria, and pertussis (Tdap) vaccine  You may need a Td booster every 10 years. Varicella (chickenpox) vaccine  You may need this vaccine if you have not already been vaccinated. Zoster (shingles) vaccine  You may need this after age 60. Measles, mumps, and rubella (MMR) vaccine  You may need at least one dose of MMR if you were born in 1957 or later. You may also need a second dose. Pneumococcal conjugate (PCV13) vaccine  You may need this if you have certain conditions and were not previously vaccinated. Pneumococcal polysaccharide (PPSV23) vaccine  You may need one or two doses if you smoke cigarettes or if you have certain conditions. Meningococcal conjugate (MenACWY) vaccine  You may need this if you have certain conditions. Hepatitis A vaccine   You may need this if you have certain conditions or if you travel or work in places where you may be exposed to hepatitis A. Hepatitis B vaccine  You may need this if you have certain conditions or if you travel or work in places where you may be exposed to hepatitis B. Haemophilus influenzae type b (Hib) vaccine  You may need this if you have certain risk factors. Human papillomavirus (HPV) vaccine  If recommended by your health care provider, you may need three doses over 6 months. You may receive vaccines as individual doses or as more than one vaccine together in one shot (combination vaccines). Talk with your health care provider about the risks and benefits of combination vaccines. What tests do I need? Blood tests  Lipid and cholesterol levels. These may be checked every 5 years, or more frequently if you are over 50 years old.  Hepatitis C test.  Hepatitis B test. Screening  Lung cancer screening. You may have this screening every year starting at age 55 if you have a 30-pack-year history of smoking and currently smoke or have quit within the past 15 years.  Prostate cancer screening. Recommendations will vary depending on your family history and other risks.  Colorectal cancer screening. All adults should have this screening starting at age 50 and continuing until age 75. Your health care provider may recommend screening at age 45 if you are at increased risk. You will have tests every 1-10 years, depending on your results and the type of screening test.  Diabetes screening. This is done by checking your blood sugar (glucose) after you have not eaten   for a while (fasting). You may have this done every 1-3 years.  Sexually transmitted disease (STD) testing. Follow these instructions at home: Eating and drinking  Eat a diet that includes fresh fruits and vegetables, whole grains, lean protein, and low-fat dairy products.  Take vitamin and mineral supplements as recommended  by your health care provider.  Do not drink alcohol if your health care provider tells you not to drink.  If you drink alcohol: ? Limit how much you have to 0-2 drinks a day. ? Be aware of how much alcohol is in your drink. In the [place] one drink equals one 12 oz bottle of beer (355 mL), one 5 oz glass of wine (148 mL), or one 1 oz glass of hard liquor (44 mL). Lifestyle  Take daily care of your teeth and gums.  Stay active. Exercise for at least 30 minutes on 5 or more days each week.  Do not use any products that contain nicotine or tobacco, such as cigarettes, e-cigarettes, and chewing tobacco. If you need help quitting, ask your health care provider.  If you are sexually active, practice safe sex. Use a condom or other form of protection to prevent STIs (sexually transmitted infections).  Talk with your health care provider about taking a low-dose aspirin every day starting at age 33. What's next?  Go to your health care provider once a year for a well check visit.  Ask your health care provider how often you should have your eyes and teeth checked.  Stay up to date on all vaccines. This information is not intended to replace advice given to you by your health care provider. Make sure you discuss any questions you have with your health care provider. Document Released: 01/30/2015 Document Revised: 12/28/2017 Document Reviewed: 12/28/2017 Elsevier Patient Education  2020 [employer].

## 2018-07-26 NOTE — Progress Notes (Signed)
Name: Agnes LawrenceDaryl E Panjwani   MRN: [id number]    DOB: [birth year]   Date:07/26/2018       Progress Note  Subjective  Chief Complaint  Chief Complaint  Patient presents with  . Annual Exam    HPI  Patient presents for annual CPE .  HTN: he is compliant with medication and denies side effects, bp has been at goal, denies chest pain or palpitation  Insulin resistance/morbid obesity: he denies polyphagia, polydipsia or polyuria , he does not want to start medications at this time, he has changed his diet. 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, but sedentary job   Depression: phq 9 is negative Depression screen [facility] 2/9 07/26/2018 03/29/2018 05/17/2017 01/31/2017 11/01/2016  Decreased Interest 0 0 0 0 0  Down, Depressed, Hopeless 0 0 0 0 0  PHQ - 2 Score 0 0 0 0 0  Altered sleeping 0 0 - - -  Tired, decreased energy 0 0 - - -  Change in appetite 0 0 - - -  Feeling bad or failure about yourself  0 0 - - -  Trouble concentrating 0 0 - - -  Moving slowly or fidgety/restless 0 0 - - -  Suicidal thoughts 0 0 - - -  PHQ-9 Score 0 0 - - -    Hypertension:  BP Readings from Last 3 Encounters:  07/26/18 126/80  03/29/18 118/70  10/05/17 122/69    Obesity: Wt Readings from Last 3 Encounters:  07/26/18 299 lb 8 oz (135.9 kg)  03/29/18 (!) 314 lb 8 oz (142.7 kg)  10/05/17 (!) 305 lb 3.2 oz (138.4 kg)   BMI Readings from Last 3 Encounters:  07/26/18 40.90 kg/m  03/29/18  42.65 kg/m  10/05/17 41.39 kg/m     Lipids:  Lab Results  Component Value Date   CHOL 180 03/29/2018   CHOL 189 05/17/2017   CHOL 158 11/01/2016   Lab Results  Component Value Date   HDL 48 03/29/2018   HDL 49 05/17/2017   HDL 52 11/01/2016   Lab Results  Component Value Date   LDLCALC 116 (H) 03/29/2018   LDLCALC 122 (H) 05/17/2017   LDLCALC 89 11/01/2016   Lab Results  Component Value Date   TRIG 69 03/29/2018   TRIG 79 05/17/2017   TRIG 79 11/01/2016   Lab Results  Component Value Date   CHOLHDL 3.8 03/29/2018   CHOLHDL 3.9 05/17/2017   CHOLHDL 3.0 11/01/2016   No results found for: LDLDIRECT Glucose:  Glucose  Date Value Ref Range Status  03/04/2011 88 65 - 99 mg/dL Final   Glucose, Bld  Date Value Ref Range Status  03/29/2018 87 65 - 99 mg/dL Final    Comment:    .            Fasting reference interval .   05/17/2017 89 65 - 99 mg/dL Final    Comment:    .  Fasting reference interval .   11/01/2016 87 65 - 99 mg/dL Final    Comment:    .            Fasting reference interval .       [facility]  AUDIT-C Score  0       Married STD testing and prevention (HIV/chl/gon/syphilis): N/A Hep C: today   Skin cancer: discussed atypical lesions  Colorectal cancer: done in 2018  Prostate cancer:  Lab Results  Component Value Date   PSA 0.5 05/17/2017   PSA 0.7 03/28/2016    IPSS Questionnaire (AUA-7): Over the past month.   1)  How often have you had a sensation of not emptying your bladder completely after you finish urinating?  0 - Not at all  2)  How often have you had to urinate again less than two hours after you finished urinating? 0 - Not at all  3)  How often have you found you stopped and started again several times when you urinated?  0 - Not at all  4) How difficult have you found it to postpone urination?  0 - Not at all  5) How often have you had a weak urinary  stream?  0 - Not at all  6) How often have you had to push or strain to begin urination?  0 - Not at all  7) How many times did you most typically get up to urinate from the time you went to bed until the time you got up in the morning?  1 - 1 time  Total score:  0-7 mildly symptomatic   8-19 moderately symptomatic   20-35 severely symptomatic    Lung cancer:  Low Dose CT Chest recommended if Age 51-80 years, 30 pack-year currently smoking OR have quit w/in 15years. Patient does not qualify.   AAA:  The USPSTF recommends one-time screening with ultrasonography in men ages 5865 to 5775 years who have ever smoked ECG:  2018   Advanced Care Planning: A voluntary discussion about advance care planning including the explanation and discussion of advance directives.  Discussed health care proxy and Living will, and the patient was able to identify a health care proxy as wife.  Patient does not have a living will at present time.  Patient Active Problem List   Diagnosis Date Noted  . Recurrent hematuria 03/29/2018  . Iron deficiency anemia due to chronic blood loss 03/29/2018  . Pure hypercholesterolemia 03/29/2018  . History of kidney stones 05/17/2017  . Abnormal ECG 05/30/2016  . History of iron deficiency anemia 04/28/2016  . History of diverticulitis 03/04/2016  . Hyperlipidemia 01/27/2015  . Hematuria, gross 07/10/2014  . Failure of erection 07/10/2014  . Hypertension, benign 07/10/2014  . Morbid obesity (HCC) 10/09/2013  . Vitamin D deficiency 10/09/2013    Past Surgical History:  Procedure Laterality Date  . ANKLE SURGERY Left 03/07/2011   fracture from trauma  . COLONOSCOPY WITH PROPOFOL N/A 07/01/2016   Procedure: COLONOSCOPY WITH PROPOFOL;  Surgeon: Wyline MoodAnna, Kiran, MD;  Location: [facility] ENDOSCOPY;  Service: Endoscopy;  Laterality: N/A;  . CYSTOSCOPY W/ RETROGRADES Bilateral 06/01/2016   Procedure: CYSTOSCOPY WITH RETROGRADE PYELOGRAM;  Surgeon: Hildred LaserBudzyn, Brian James, MD;  Location: [facility]  ORS;  Service: Urology;  Laterality: Bilateral;  . CYSTOSCOPY WITH STENT PLACEMENT Left 06/01/2016   Procedure: CYSTOSCOPY WITH STENT PLACEMENT;  Surgeon: Hildred LaserBudzyn, Brian James, MD;  Location: [facility] ORS;  Service: Urology;  Laterality:  Left;  . ESOPHAGOGASTRODUODENOSCOPY (EGD) WITH PROPOFOL N/A 07/01/2016   Procedure: ESOPHAGOGASTRODUODENOSCOPY (EGD) WITH PROPOFOL;  Surgeon: Wyline Mood, MD;  Location[facility] ENDOSCOPY;  Service: Endoscopy;  Laterality: N/A;  . FLEXIBLE SIGMOIDOSCOPY N/A 02/24/2017   Procedure: FLEXIBLE SIGMOIDOSCOPY;  Surgeon: Wyline Mood, MD;  Location[facility] ENDOSCOPY;  Service: Gastroenterology;  Laterality: N/A;  . IR RADIOLOGIST EVAL & MGMT  06/16/2016  . IR RENAL SELECTIVE  UNI INC S&I MOD SED  07/28/2016  . IR US GUIDE VASC ACCESS LEFT  07/28/2016  . STONE EXTRACTION WITH BASKET Left 06/01/2016   Procedure: STONE EXTRACTION WITH BASKET;  Surgeon: Hildred Laser, MD;  Location: [facility] ORS;  Service: Urology;  Laterality: Left;  . TONSILLECTOMY    . TONSILLECTOMY AND ADENOIDECTOMY  age 56  . URETEROSCOPY Left 06/01/2016   Procedure: URETEROSCOPY;  Surgeon: Hildred Laser, MD;  Location: [facility] ORS;  Service: Urology;  Laterality: Left;    Family History  Problem Relation Age of Onset  . Breast cancer Mother   . Hypertension Mother   . Diabetes Father   . Hypertension Father   . Hypertension Sister   . Pancreatic disease Sister   . Diabetes Sister   . Hypertension Sister   . Hypertension Brother   . Diverticulosis Brother   . Prostate cancer Maternal Uncle   . Prostate cancer Maternal Uncle   . Dementia Maternal Grandmother   . Leukemia Maternal Grandfather   . Heart disease Paternal Grandmother   . Stomach cancer Paternal Grandfather   . Kidney cancer Neg Hx   . Bladder Cancer Neg Hx     Social History   Socioeconomic History  . Marital status: Married    Spouse name: Sherrie  . Number of children: 1  . Years of education: Not on file  . Highest education  level: 12th grade  Occupational History  . Occupation: Education officer, community  Social Needs  . Financial resource strain: Not hard at all  . Food insecurity    Worry: Never true    Inability: Never true  . Transportation needs    Medical: No    Non-medical: No  Tobacco Use  . Smoking status: Never Smoker  . Smokeless tobacco: Never Used  Substance and Sexual Activity  . Alcohol use: No    Alcohol/week: 0.0 standard drinks  . Drug use: No  . Sexual activity: Yes    Partners: Female    Comment: wife hysterectomy   Lifestyle  . Physical activity    Days per week: 1 day    Minutes per session: 120 min  . Stress: Not at all  Relationships  . Social connections    Talks on phone: More than three times a week    Gets together: Twice a week    Attends religious service: More than 4 times per year    Active member of club or organization: Yes    Attends meetings of clubs or organizations: Never    Relationship status: Married  . Intimate partner violence    Fear of current or ex partner: No    Emotionally abused: No    Physically abused: No    Forced sexual activity: No  Other Topics Concern  . Not on file  Social History Narrative  . Not on file     Current Outpatient Medications:  .  ferrous sulfate 325 (65 FE) MG EC tablet, Take 325 mg by mouth daily with breakfast. (0600 & 1530), Disp: , Rfl:  .  Multiple Vitamin (MULTIVITAMIN WITH MINERALS) TABS tablet, Take 1 tablet by mouth daily at 6 (six) AM., Disp: , Rfl:  .  tadalafil (CIALIS) 20 MG tablet, Take 20 mg by mouth daily as needed for erectile dysfunction. , Disp: , Rfl:  .  telmisartan-hydrochlorothiazide (MICARDIS HCT) 80-25 MG tablet, Take 1 tablet by mouth daily., Disp: 90 tablet, Rfl: 1  No Known Allergies   ROS  Constitutional: Negative for fever or weight change.  Respiratory: Negative for cough and shortness of breath.   Cardiovascular: Negative for chest pain or palpitations.  Gastrointestinal:  Negative for abdominal pain, no bowel changes.  Musculoskeletal: Negative for gait problem or joint swelling.  Skin: Negative for rash.  Neurological: Negative for dizziness or headache.  No other specific complaints in a complete review of systems (except as listed in HPI above).   Objective  Vitals:   07/26/18 1113  BP: 126/80  Pulse: 82  Resp: 16  Temp: (!) 97.1 F (36.2 C)  TempSrc: Oral  SpO2: 98%  Weight: 299 lb 8 oz (135.9 kg)  Height: 5' 11.75" (1.822 m)    Body mass index is 40.9 kg/m.  Physical Exam  Constitutional: Patient appears well-developed and well-nourished. No distress.  HENT: Head: Normocephalic and atraumatic. Ears: B TMs ok, no erythema or effusion; Nose: Nose normal. Mouth/Throat: Oropharynx is clear and moist. No oropharyngeal exudate.  Eyes: Conjunctivae and EOM are normal. Pupils are equal, round, and reactive to light. No scleral icterus.  Neck: Normal range of motion. Neck supple. No JVD present. No thyromegaly present.  Cardiovascular: Normal rate, regular rhythm and normal heart sounds.  No murmur heard. No BLE edema. Pulmonary/Chest: Effort normal and breath sounds normal. No respiratory distress. Abdominal: Soft. Bowel sounds are normal, no distension. There is no tenderness. no masses MALE GENITALIA: Normal descended testes bilaterally, no masses palpated, no hernias, no lesions, no discharge RECTAL: not done  Musculoskeletal: Normal range of motion, no joint effusions. No gross deformities Neurological: he is alert and oriented to person, place, and time. No cranial nerve deficit. Coordination, balance, strength, speech and gait are normal.  Skin: Skin is warm and dry. Entire feet has maceration between toe webs, also has well demarcated rash on lateral column of both feet and thick toenails of right first toe.  Psychiatric: Patient has a normal mood and affect. behavior is normal. Judgment and thought content normal.  Recent Results (from  the past 2160 hour(s))  Iron and TIBC     Status: None   Collection Time: 06/04/18 11:04 AM  Result Value Ref Range   Iron 72 45 - 182 ug/dL   TIBC 318 250 - 450 ug/dL   Saturation Ratios 23 17.9 - 39.5 %   UIBC 246 ug/dL    Comment: Performed at [facility], [address] [place], [place] [address]  Ferritin     Status: Abnormal   Collection Time: 06/04/18 11:04 AM  Result Value Ref Range   Ferritin 21 (L) 24 - 336 ng/mL    Comment: Performed at [facility], [address] [place], [place] [address]  CBC     Status: None   Collection Time: 06/04/18 11:04 AM  Result Value Ref Range   WBC 6.3 4.0 - 10.5 K/uL   RBC 4.75 4.22 - 5.81 MIL/uL   Hemoglobin 13.2 13.0 - 17.0 g/dL   HCT 40.1 39.0 - 52.0 %   MCV 84.4 80.0 - 100.0 fL   MCH 27.8 26.0 - 34.0  pg   MCHC 32.9 30.0 - 36.0 g/dL   RDW 57.813.1 46.911.5 - 62.915.5 %   Platelets 192 150 - 400 K/uL   nRBC 0.0 0.0 - 0.2 %    Comment: Performed at [facility], [address] [place], [place] [address]     PHQ2/9: Depression screen [facility] 07/26/2018 03/29/2018 05/17/2017 01/31/2017 11/01/2016  Decreased Interest 0 0 0 0 0  Down, Depressed, Hopeless 0 0 0 0 0  PHQ - 2 Score 0 0 0 0 0  Altered sleeping 0 0 - - -  Tired, decreased energy 0 0 - - -  Change in appetite 0 0 - - -  Feeling bad or failure about yourself  0 0 - - -  Trouble concentrating 0 0 - - -  Moving slowly or fidgety/restless 0 0 - - -  Suicidal thoughts 0 0 - - -  PHQ-9 Score 0 0 - - -     Fall Risk: Fall Risk  03/29/2018 05/17/2017 05/17/2017 01/31/2017 11/01/2016  Falls in the past year? 0 Yes Yes No No  Number falls in past yr: 0 2 or more 2 or more - -  Injury with Fall? 0 Yes Yes - -  Risk Factor Category  - High Fall Risk High Fall Risk - -     Functional Status Survey: Is the patient deaf or have difficulty hearing?: No Does the patient have difficulty seeing, even when wearing glasses/contacts?: No Does the patient have difficulty  concentrating, remembering, or making decisions?: No Does the patient have difficulty walking or climbing stairs?: No Does the patient have difficulty dressing or bathing?: No Does the patient have difficulty doing errands alone such as visiting a doctor's office or shopping?: No    Assessment & Plan  1. Encounter for routine history and physical exam for male   2. Insulin resistance  He does not want medication, he prefers changing his diet   3. Prostate cancer screening  - PSA  4. Colon cancer screening  We will ask Dr. Tobi BastosAnna to find out when due, had a colonoscopy in 2018, followed by a sigmoidoscopy but never had a capsule colonoscopy, we will see if he needs to go back for colonoscopy and when   5. 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 Need for hepatitis C screening test  - Hepatitis C Antibody  7. Morbid obesity (HCC)  Discussed with the patient the risk posed by an increased BMI. Discussed importance of portion control, calorie counting and at least 150 minutes of physical activity weekly. Avoid sweet beverages and drink more water. Eat at least 6 servings of fruit and vegetables daily   8. Essential hypertension   9. Tinea pedis of both feet  - terbinafine (LAMISIL) 250 MG tablet; Take 1 tablet (250 mg total) by mouth daily.  Dispense: 90 tablet; Refill: 0  10. Onychomycosis  - Hepatic function panel - terbinafine (LAMISIL) 250 MG tablet; Take 1 tablet (250 mg total) by mouth daily.  Dispense: 90 tablet; Refill: 0  11. Multiple acquired skin tags   -Prostate cancer screening and PSA options (with potential risks and benefits of testing vs not testing) were discussed along with recent recs/guidelines. -USPSTF grade A and B recommendations reviewed with patient;  age-appropriate recommendations, preventive care, screening tests, etc discussed and encouraged; healthy living encouraged; see AVS for patient education given to patient -Discussed importance of 150 minutes of physical activity weekly, eat two servings of fish weekly, eat one serving of tree nuts ( cashews, pistachios, pecans, almonds.Marland Kitchen) every other day, eat 6 servings of fruit/vegetables daily and drink plenty of water and avoid sweet beverages.

## 2018-07-27 LAB — HEPATIC FUNCTION PANEL
AG Ratio: 1.6 (calc) (ref 1.0–2.5)
ALT: 22 U/L (ref 9–46)
AST: 16 U/L (ref 10–35)
Albumin: 4.1 g/dL (ref 3.6–5.1)
Alkaline phosphatase (APISO): 66 U/L (ref 35–144)
Bilirubin, Direct: 0.1 mg/dL (ref 0.0–0.2)
Globulin: 2.6 g/dL (calc) (ref 1.9–3.7)
Indirect Bilirubin: 0.4 mg/dL (calc) (ref 0.2–1.2)
Total Bilirubin: 0.5 mg/dL (ref 0.2–1.2)
Total Protein: 6.7 g/dL (ref 6.1–8.1)

## 2018-07-27 LAB — HEPATITIS C ANTIBODY
Hepatitis C Ab: NONREACTIVE
SIGNAL TO CUT-OFF: 0.01 (ref <1.00)

## 2018-07-27 LAB — PSA: PSA: 0.5 ng/mL (ref <=4.0)

## 2018-08-03 ENCOUNTER — Encounter: Payer: Self-pay | Admitting: Family Medicine

## 2018-08-03 ENCOUNTER — Ambulatory Visit: Payer: BC Managed Care – PPO | Admitting: Family Medicine

## 2018-08-03 ENCOUNTER — Other Ambulatory Visit: Payer: Self-pay

## 2018-08-03 VITALS — BP 130/70 | HR 90 | Temp 97.3°F | Resp 16 | Ht 71.75 in | Wt 301.9 lb

## 2018-08-03 DIAGNOSIS — L918 Other hypertrophic disorders of the skin: Secondary | ICD-10-CM | POA: Diagnosis not present

## 2018-08-03 NOTE — Progress Notes (Signed)
Name: Angel Chase   MRN: [id number]    DOB: Nov 08, 1965   Date:08/03/2018       Progress Note  Subjective  Chief Complaint  Chief Complaint  Patient presents with  . skin tag removal    skin tags over top of his eye and on the left side of his neck.    HPI  Patient came in for skin tag removal, the worse ones are the ones on top of right eyelid, it bothers him and he is constantly rubbing it   Patient Active Problem List   Diagnosis Date Noted  . Insulin resistance 07/26/2018  . Recurrent hematuria 03/29/2018  . Iron deficiency anemia due to chronic blood loss 03/29/2018  . Pure hypercholesterolemia 03/29/2018  . History of kidney stones 05/17/2017  . Abnormal ECG 05/30/2016  . History of iron deficiency anemia 04/28/2016  . History of diverticulitis 03/04/2016  . Hyperlipidemia 01/27/2015  . Hematuria, gross 07/10/2014  . Failure of erection 07/10/2014  . Hypertension, benign 07/10/2014  . Morbid obesity ([place]) 10/09/2013  . Vitamin D deficiency 10/09/2013    Social History   Tobacco Use  . Smoking status: Never Smoker  . Smokeless tobacco: Never Used  Substance Use Topics  . Alcohol use: No    Alcohol/week: 0.0 standard drinks     Current Outpatient Medications:  .  ferrous sulfate 325 (65 FE) MG EC tablet, Take 325 mg by mouth daily with breakfast. (0600 & 1530), Disp: , Rfl:  .  Multiple Vitamin (MULTIVITAMIN WITH MINERALS) TABS tablet, Take 1 tablet by mouth daily at 6 (six) AM., Disp: , Rfl:  .  tadalafil (CIALIS) 20 MG tablet, Take 20 mg by mouth daily as needed for erectile dysfunction. , Disp: , Rfl:  .  telmisartan-hydrochlorothiazide (MICARDIS HCT) 80-25 MG tablet, Take 1 tablet by mouth daily., Disp: 90 tablet, Rfl: 1 .  terbinafine (LAMISIL) 250 MG tablet, Take 1 tablet (250 mg total) by mouth daily., Disp: 90 tablet, Rfl: 0  No Known Allergies  ROS   Ten systems reviewed and is negative except as mentioned in HPI   Objective  Vitals:   08/03/18 1141  BP: 130/70  Pulse: 90  Resp: 16  Temp: (!) 97.3 F (36.3 C)  TempSrc: Temporal  SpO2: 98%  Weight: (!) 301 lb 14.4 oz (136.9 kg)  Height: 5' 11.75" (1.822 m)    Body mass index is 41.23 kg/m.    Physical Exam  Awake, alert and oriented CTA, normal sinus rhythm Multiple skin tags on neck, anterior chest and two on the right upper eye lid    Assessment & Plan  1. Skin tag  Consent signed: verbal given   Procedure: Skin Mass Removal Location: right eye lid, neck  Equipment used: derma-blade, high temperature cautery, sterile scalpel, tweezers, curved scissors Anesthesia: 1% Lidocaine w/o Epinephrine  Cleaned and prepped: Betadine  After consent signed, are of skin prepped with betadine. Lidocaine w/o epinephrine injected into skin underneath skin mass. After properly numbed sterile equipment used to remove tag.  Specimen sent for pathology analysis. Instructed on proper care to allow for proper healing. F/U for nursing visit if needed.

## 2018-09-05 ENCOUNTER — Inpatient Hospital Stay: Payer: BC Managed Care – PPO | Attending: Oncology

## 2018-09-05 ENCOUNTER — Other Ambulatory Visit: Payer: Self-pay

## 2018-09-05 ENCOUNTER — Telehealth: Payer: Self-pay | Admitting: *Deleted

## 2018-09-05 DIAGNOSIS — Z79899 Other long term (current) drug therapy: Secondary | ICD-10-CM | POA: Diagnosis not present

## 2018-09-05 DIAGNOSIS — D500 Iron deficiency anemia secondary to blood loss (chronic): Secondary | ICD-10-CM

## 2018-09-05 DIAGNOSIS — D508 Other iron deficiency anemias: Secondary | ICD-10-CM | POA: Diagnosis not present

## 2018-09-05 LAB — CBC
HCT: 43.1 % (ref 39.0–52.0)
Hemoglobin: 14.1 g/dL (ref 13.0–17.0)
MCH: 27.5 pg (ref 26.0–34.0)
MCHC: 32.7 g/dL (ref 30.0–36.0)
MCV: 84 fL (ref 80.0–100.0)
Platelets: 215 10*3/uL (ref 150–400)
RBC: 5.13 MIL/uL (ref 4.22–5.81)
RDW: 13.4 % (ref 11.5–15.5)
WBC: 7.2 10*3/uL (ref 4.0–10.5)
nRBC: 0 % (ref 0.0–0.2)

## 2018-09-05 LAB — IRON AND TIBC
Iron: 89 ug/dL (ref 45–182)
Saturation Ratios: 26 % (ref 17.9–39.5)
TIBC: 343 ug/dL (ref 250–450)
UIBC: 254 ug/dL

## 2018-09-05 LAB — FERRITIN: Ferritin: 23 ng/mL — ABNORMAL LOW (ref 24–336)

## 2018-09-05 NOTE — Telephone Encounter (Signed)
-----   Message from Sindy Guadeloupe, MD sent at 09/05/2018 12:01 PM EDT ----- Ferritin has always remained low despite IV iron in the past. Since his hemoglobin is normal, we will hold off on IV iron at this time. Thanks, Astrid Divine

## 2018-09-05 NOTE — Telephone Encounter (Signed)
Called and spoke to pt and told him that even getting iron IV in the past did not really make ferritin go up but his hgb is normal and even better than it was last one he had before Aug. Lab. She feels since hgb is normal that he will not need any iron treatments and we will evaluate it again when he comes back in Nov. He is agreeable to the plan

## 2018-09-28 ENCOUNTER — Other Ambulatory Visit: Payer: Self-pay | Admitting: Family Medicine

## 2018-09-28 DIAGNOSIS — I10 Essential (primary) hypertension: Secondary | ICD-10-CM

## 2018-10-01 ENCOUNTER — Ambulatory Visit: Payer: BLUE CROSS/BLUE SHIELD | Admitting: Family Medicine

## 2018-12-05 ENCOUNTER — Encounter: Payer: Self-pay | Admitting: Oncology

## 2018-12-05 ENCOUNTER — Other Ambulatory Visit: Payer: Self-pay

## 2018-12-06 ENCOUNTER — Inpatient Hospital Stay: Payer: BC Managed Care – PPO | Admitting: Oncology

## 2018-12-06 ENCOUNTER — Other Ambulatory Visit: Payer: Self-pay

## 2018-12-06 ENCOUNTER — Inpatient Hospital Stay: Payer: BC Managed Care – PPO | Attending: Oncology

## 2018-12-06 VITALS — BP 145/89 | HR 83 | Temp 97.7°F | Wt 314.7 lb

## 2018-12-06 DIAGNOSIS — Z833 Family history of diabetes mellitus: Secondary | ICD-10-CM | POA: Diagnosis not present

## 2018-12-06 DIAGNOSIS — Z8042 Family history of malignant neoplasm of prostate: Secondary | ICD-10-CM | POA: Diagnosis not present

## 2018-12-06 DIAGNOSIS — G4733 Obstructive sleep apnea (adult) (pediatric): Secondary | ICD-10-CM | POA: Insufficient documentation

## 2018-12-06 DIAGNOSIS — Z806 Family history of leukemia: Secondary | ICD-10-CM | POA: Insufficient documentation

## 2018-12-06 DIAGNOSIS — D500 Iron deficiency anemia secondary to blood loss (chronic): Secondary | ICD-10-CM

## 2018-12-06 DIAGNOSIS — I10 Essential (primary) hypertension: Secondary | ICD-10-CM | POA: Diagnosis not present

## 2018-12-06 DIAGNOSIS — Z803 Family history of malignant neoplasm of breast: Secondary | ICD-10-CM | POA: Insufficient documentation

## 2018-12-06 DIAGNOSIS — D508 Other iron deficiency anemias: Secondary | ICD-10-CM | POA: Diagnosis not present

## 2018-12-06 DIAGNOSIS — Z8249 Family history of ischemic heart disease and other diseases of the circulatory system: Secondary | ICD-10-CM | POA: Diagnosis not present

## 2018-12-06 DIAGNOSIS — Z79899 Other long term (current) drug therapy: Secondary | ICD-10-CM | POA: Diagnosis not present

## 2018-12-06 LAB — CBC
HCT: 43 % (ref 39.0–52.0)
Hemoglobin: 14 g/dL (ref 13.0–17.0)
MCH: 27.9 pg (ref 26.0–34.0)
MCHC: 32.6 g/dL (ref 30.0–36.0)
MCV: 85.8 fL (ref 80.0–100.0)
Platelets: 212 10*3/uL (ref 150–400)
RBC: 5.01 MIL/uL (ref 4.22–5.81)
RDW: 13.2 % (ref 11.5–15.5)
WBC: 7.6 10*3/uL (ref 4.0–10.5)
nRBC: 0 % (ref 0.0–0.2)

## 2018-12-06 LAB — IRON AND TIBC
Iron: 94 ug/dL (ref 45–182)
Saturation Ratios: 27 % (ref 17.9–39.5)
TIBC: 347 ug/dL (ref 250–450)
UIBC: 253 ug/dL

## 2018-12-06 LAB — FERRITIN: Ferritin: 28 ng/mL (ref 24–336)

## 2018-12-07 NOTE — Progress Notes (Signed)
Hematology/Oncology Consult note [facility]  [phone] [phone]  Patient Care Team: Alba CorySowles, Krichna, MD as PCP - General (Family Medicine) Creig Hinesao, Leevi Cullars C, MD as Consulting Physician (Oncology) Hildred LaserBudzyn, Brian James, MD as Consulting Physician (Urology)   Name of the patient: Angel HecklerDaryl Sisler  [id number]  1966/01/02   Date of visit: 12/07/18  Diagnosis-iron deficiency anemia due to hematuria  Chief complaint/ Reason for visit-routine follow-up of iron deficiency anemia  Heme/Onc history: Patient is a 53 year old male who has a long standing history of hematuria. He has been seen by both nephrology and urology in the past to evaluate his hematuria and states he underwent extensive testing but no cause for hematuria was found. He has persistent hematuria all along sometimes tea colored sometimes bright red but was never anemic in the past. No family h/o bleeding disorders. No gum bleeds, nose bleeds or bleeding in his stool. He has had ankle surgery in the past without any bleeding issues.  He has been referred to us for evaluation and management of anemia. Most recent CBC from 04/28/2016 showed white count of 6.8, H&H of 10.9/35.9 with an MCV of 72.5 and a platelet count of 295. Iron studies revealed a low serum iron of 18 and low iron saturation of 4%. TIBC was high normal at 407. Ferritin was low at 6. Patient is yet to see urology for hematuria. [facility]   Also seen by Dr. Tobi BastosAnna from GI and underwent EGD and colonoscopy which was unremarkable  He received 2 doses of Feraheme in June 2019  Interval history-overall patient feels well.  He is currently taking oral iron and continues to have intermittent hematuria which has been evaluated extensively in the past by both nephrology and urology.  ECOG PS- 0 Pain scale- 0   Review of systems- Review of Systems  Constitutional: Negative for  chills, fever, malaise/fatigue and weight loss.  HENT: Negative for congestion, ear discharge and nosebleeds.   Eyes: Negative for blurred vision.  Respiratory: Negative for cough, hemoptysis, sputum production, shortness of breath and wheezing.   Cardiovascular: Negative for chest pain, palpitations, orthopnea and claudication.  Gastrointestinal: Negative for abdominal pain, blood in stool, constipation, diarrhea, heartburn, melena, nausea and vomiting.  Genitourinary: Negative for dysuria, flank pain, frequency, hematuria and urgency.  Musculoskeletal: Negative for back pain, joint pain and myalgias.  Skin: Negative for rash.  Neurological: Negative for dizziness, tingling, focal weakness, seizures, weakness and headaches.  Endo/Heme/Allergies: Does not bruise/bleed easily.  Psychiatric/Behavioral: Negative for depression and suicidal ideas. The patient does not have insomnia.       No Known Allergies   Past Medical History:  Diagnosis Date  . Anemia   . ED (erectile dysfunction)   . EKG abnormalities   . Hematuria   . Hematuria    x 20 yr per pt  . History of kidney stones   . Hypertension   . Obesity   . Obstructive sleep apnea   . Vitamin D deficiency      Past Surgical History:  Procedure Laterality Date  . ANKLE SURGERY Left 03/07/2011   fracture from trauma  . COLONOSCOPY WITH PROPOFOL N/A 07/01/2016   Procedure: COLONOSCOPY WITH PROPOFOL;  Surgeon: Wyline MoodAnna, Kiran, MD;  [facility];  Service: Endoscopy;  Laterality: N/A;  . CYSTOSCOPY W/ RETROGRADES Bilateral 06/01/2016   Procedure: CYSTOSCOPY WITH RETROGRADE PYELOGRAM;  Surgeon: Hildred LaserBudzyn, Brian James, MD;  Location: [facility] ORS;  Service:  Urology;  Laterality: Bilateral;  . CYSTOSCOPY WITH STENT PLACEMENT Left 06/01/2016   Procedure: CYSTOSCOPY WITH STENT PLACEMENT;  Surgeon: Nickie Retort, MD;  Location: [facility] ORS;  Service: Urology;  Laterality: Left;  . ESOPHAGOGASTRODUODENOSCOPY (EGD) WITH PROPOFOL N/A  07/01/2016   Procedure: ESOPHAGOGASTRODUODENOSCOPY (EGD) WITH PROPOFOL;  Surgeon: Jonathon Bellows, MD;  Location: [facility] ENDOSCOPY;  Service: Endoscopy;  Laterality: N/A;  . FLEXIBLE SIGMOIDOSCOPY N/A 02/24/2017   Procedure: FLEXIBLE SIGMOIDOSCOPY;  Surgeon: Jonathon Bellows, MD;  Location: [facility] ENDOSCOPY;  Service: Gastroenterology;  Laterality: N/A;  . IR RADIOLOGIST EVAL & MGMT  06/16/2016  . IR RENAL SELECTIVE  UNI INC S&I MOD SED  07/28/2016  . IR US GUIDE VASC ACCESS LEFT  07/28/2016  . STONE EXTRACTION WITH BASKET Left 06/01/2016   Procedure: STONE EXTRACTION WITH BASKET;  Surgeon: Nickie Retort, MD;  Location: [facility] ORS;  Service: Urology;  Laterality: Left;  . TONSILLECTOMY    . TONSILLECTOMY AND ADENOIDECTOMY  age 24  . URETEROSCOPY Left 06/01/2016   Procedure: URETEROSCOPY;  Surgeon: Nickie Retort, MD;  Location: [facility] ORS;  Service: Urology;  Laterality: Left;    Social History   Socioeconomic History  . Marital status: Married    Spouse name: Sherrie  . Number of children: 1  . Years of education: Not on file  . Highest education level: 12th grade  Occupational History  . Occupation: Recruitment consultant  Social Needs  . Financial resource strain: Not hard at all  . Food insecurity    Worry: Never true    Inability: Never true  . Transportation needs    Medical: No    Non-medical: No  Tobacco Use  . Smoking status: Never Smoker  . Smokeless tobacco: Never Used  Substance and Sexual Activity  . Alcohol use: No    Alcohol/week: 0.0 standard drinks  . Drug use: No  . Sexual activity: Yes    Partners: Female    Comment: wife hysterectomy   Lifestyle  . Physical activity    Days per week: 1 day    Minutes per session: 120 min  . Stress: Not at all  Relationships  . Social connections    Talks on phone: More than three times a week    Gets together: Twice a week    Attends religious service: More than 4 times per year    Active member of club or organization:  Yes    Attends meetings of clubs or organizations: Never    Relationship status: Married  . Intimate partner violence    Fear of current or ex partner: No    Emotionally abused: No    Physically abused: No    Forced sexual activity: No  Other Topics Concern  . Not on file  Social History Narrative  . Not on file    Family History  Problem Relation Age of Onset  . Breast cancer Mother   . Hypertension Mother   . Diabetes Father   . Hypertension Father   . Hypertension Sister   . Pancreatic disease Sister   . Diabetes Sister   . Hypertension Sister   . Hypertension Brother   . Diverticulosis Brother   . Prostate cancer Maternal Uncle   . Prostate cancer Maternal Uncle   . Dementia Maternal Grandmother   . Leukemia Maternal Grandfather   . Heart disease Paternal Grandmother   . Stomach cancer Paternal Grandfather   . Kidney cancer Neg Hx   . Bladder Cancer Neg Hx  Current Outpatient Medications:  .  ferrous sulfate 325 (65 FE) MG EC tablet, Take 325 mg by mouth daily with breakfast. (0600 & 1530), Disp: , Rfl:  .  Multiple Vitamin (MULTIVITAMIN WITH MINERALS) TABS tablet, Take 1 tablet by mouth daily at 6 (six) AM., Disp: , Rfl:  .  telmisartan-hydrochlorothiazide (MICARDIS HCT) 80-25 MG tablet, TAKE 1 TABLET BY MOUTH DAILY, Disp: 90 tablet, Rfl: 1 .  tadalafil (CIALIS) 20 MG tablet, Take 20 mg by mouth daily as needed for erectile dysfunction. , Disp: , Rfl:   Physical exam:  Vitals:   12/06/18 0951  BP: (!) 145/89  Pulse: 83  Temp: 97.7 F (36.5 C)  TempSrc: Tympanic  Weight: (!) 314 lb 11.2 oz (142.7 kg)   Physical Exam HENT:     Head: Normocephalic and atraumatic.  Eyes:     Pupils: Pupils are equal, round, and reactive to light.  Neck:     Musculoskeletal: Normal range of motion.  Cardiovascular:     Rate and Rhythm: Normal rate and regular rhythm.     Heart sounds: Normal heart sounds.  Pulmonary:     Effort: Pulmonary effort is normal.      Breath sounds: Normal breath sounds.  Abdominal:     General: Bowel sounds are normal.     Palpations: Abdomen is soft.  Skin:    General: Skin is warm and dry.  Neurological:     Mental Status: He is alert and oriented to person, place, and time.      CMP Latest Ref Rng & Units 07/26/2018  Glucose 65 - 99 mg/dL -  BUN 7 - 25 mg/dL -  Creatinine 7.67 - 2.09 mg/dL -  Sodium 470 - 962 mmol/L -  Potassium 3.5 - 5.3 mmol/L -  Chloride 98 - 110 mmol/L -  CO2 20 - 32 mmol/L -  Calcium 8.6 - 10.3 mg/dL -  Total Protein 6.1 - 8.1 g/dL 6.7  Total Bilirubin 0.2 - 1.2 mg/dL 0.5  Alkaline Phos 40 - 115 U/L -  AST 10 - 35 U/L 16  ALT 9 - 46 U/L 22   CBC Latest Ref Rng & Units 12/06/2018  WBC 4.0 - 10.5 K/uL 7.6  Hemoglobin 13.0 - 17.0 g/dL 83.6  Hematocrit 62.9 - 52.0 % 43.0  Platelets 150 - 400 K/uL 212     Assessment and plan- Patient is a 53 y.o. male with iron deficiency anemia possibly secondary to intermittent hematuria here for routine follow-up  Patient has not required IV iron for the last 2 years.  His hemoglobin continues to remain stable Around 14.  He has chronically low ferritin and today his ferritin is 28 which is similar to before.  Plan is to hold off on giving him IV iron until he starts to become anemic.  Given the stability of his hemoglobin I will repeat CBC ferritin and iron studies in 4 and 8 months and see him back in 8 months   Visit Diagnosis 1. Iron deficiency anemia due to chronic blood loss      Dr. Owens Shark, MD, MPH [facility] [phone] 12/07/2018 9:26 AM

## 2019-02-01 ENCOUNTER — Ambulatory Visit: Payer: BC Managed Care – PPO | Admitting: Family Medicine

## 2019-03-22 ENCOUNTER — Ambulatory Visit: Payer: BC Managed Care – PPO | Admitting: Family Medicine

## 2019-03-22 ENCOUNTER — Other Ambulatory Visit: Payer: Self-pay

## 2019-03-22 ENCOUNTER — Encounter: Payer: Self-pay | Admitting: Family Medicine

## 2019-03-22 DIAGNOSIS — D500 Iron deficiency anemia secondary to blood loss (chronic): Secondary | ICD-10-CM

## 2019-03-22 DIAGNOSIS — E559 Vitamin D deficiency, unspecified: Secondary | ICD-10-CM | POA: Diagnosis not present

## 2019-03-22 DIAGNOSIS — E8881 Metabolic syndrome: Secondary | ICD-10-CM

## 2019-03-22 DIAGNOSIS — E7800 Pure hypercholesterolemia, unspecified: Secondary | ICD-10-CM

## 2019-03-22 DIAGNOSIS — K429 Umbilical hernia without obstruction or gangrene: Secondary | ICD-10-CM

## 2019-03-22 DIAGNOSIS — I10 Essential (primary) hypertension: Secondary | ICD-10-CM

## 2019-03-22 DIAGNOSIS — N029 Recurrent and persistent hematuria with unspecified morphologic changes: Secondary | ICD-10-CM

## 2019-03-22 MED ORDER — TELMISARTAN-HCTZ 80-25 MG PO TABS: 1.0000 | ORAL_TABLET | Freq: Every day | ORAL | 1 refills | Status: DC

## 2019-03-22 NOTE — Progress Notes (Signed)
Name: Angel Chase   MRN: [id number]    DOB: 1965-11-25   Date:03/22/2019       Progress Note  Subjective  Chief Complaint  Chief Complaint  Patient presents with  . Medication Refill    6 month F/U  . Hypertension  . Insulin Resistance  . Morbid Obesity    HPI  Iron deficiency anemia due macroscopic hematuria: symptoms started over 20 years ago. He sees Dealer, hematologist and Nephrologist ( in the past ). Last labs was good, normal HCT. He still has hematuria daily No fever or chills or dysuria   IMPRESSION: Status post aortic, pelvic, and left renal angiogram for investigation of left-sided hematuria with no arteriovenous malformation, venous shunting, or parenchymal lesion identified.  There is indistinct corticomedullary perfusion of the left kidney with questionable slow flow within distal vasculature. This finding could represent underlying medical renal condition such as chronic glomerulonephritis, or polyarteritis nodosa.  HTN: he is taking medication, denies side effects, bp is at goal today. No chest pain or palpitation   Insulin resistance/morbid obesity: he denies polyphagia, polydipsia or polyuria , he does not want to start medications. Last A1C was at goal, we will recheck labs  Hyperlipidemia: discussed statin medication  The 10-year ASCVD risk score Mikey Bussing DC Brooke Bonito., et al., 2013) is: 10.3%   Values used to calculate the score:     Age: 54 years     Sex: Male     Is Non-Hispanic African American: Yes     Diabetic: No     Tobacco smoker: No     Systolic Blood Pressure: 384 mmHg     Is BP treated: Yes     HDL Cholesterol: 48 mg/dL     Total Cholesterol: 180 mg/dL  Vitamin D deficiency: advised to continue vitamin D supplementation   Umbilical hernia: he was at work , he was digging with a shovel and noticed that the hernia is bigger but has gone down again, discussed referral to surgeon and he would like to monitor for now   Patient Active Problem  List   Diagnosis Date Noted  . Insulin resistance 07/26/2018  . Recurrent hematuria 03/29/2018  . Iron deficiency anemia due to chronic blood loss 03/29/2018  . Pure hypercholesterolemia 03/29/2018  . History of kidney stones 05/17/2017  . Abnormal ECG 05/30/2016  . History of iron deficiency anemia 04/28/2016  . History of diverticulitis 03/04/2016  . Hyperlipidemia 01/27/2015  . Hematuria, gross 07/10/2014  . Failure of erection 07/10/2014  . Hypertension, benign 07/10/2014  . Morbid obesity ([place]) 10/09/2013  . Vitamin D deficiency 10/09/2013    Past Surgical History:  Procedure Laterality Date  . ANKLE SURGERY Left 03/07/2011   fracture from trauma  . COLONOSCOPY WITH PROPOFOL N/A 07/01/2016   Procedure: COLONOSCOPY WITH PROPOFOL;  Surgeon: Jonathon Bellows, MD;  Location: [facility] ENDOSCOPY;  Service: Endoscopy;  Laterality: N/A;  . CYSTOSCOPY W/ RETROGRADES Bilateral 06/01/2016   Procedure: CYSTOSCOPY WITH RETROGRADE PYELOGRAM;  Surgeon: Nickie Retort, MD;  Location: [facility] ORS;  Service: Urology;  Laterality: Bilateral;  . CYSTOSCOPY WITH STENT PLACEMENT Left 06/01/2016   Procedure: CYSTOSCOPY WITH STENT PLACEMENT;  Surgeon: Nickie Retort, MD;  Location: [facility] ORS;  Service: Urology;  Laterality: Left;  . ESOPHAGOGASTRODUODENOSCOPY (EGD) WITH PROPOFOL N/A 07/01/2016   Procedure: ESOPHAGOGASTRODUODENOSCOPY (EGD) WITH PROPOFOL;  Surgeon: Jonathon Bellows, MD;  Location: [facility] ENDOSCOPY;  Service: Endoscopy;  Laterality: N/A;  . FLEXIBLE SIGMOIDOSCOPY N/A 02/24/2017   Procedure: FLEXIBLE SIGMOIDOSCOPY;  Surgeon:  Wyline Mood, MD;  Location: [facility] ENDOSCOPY;  Service: Gastroenterology;  Laterality: N/A;  . IR RADIOLOGIST EVAL & MGMT  06/16/2016  . IR RENAL SELECTIVE  UNI INC S&I MOD SED  07/28/2016  . IR US GUIDE VASC ACCESS LEFT  07/28/2016  . STONE EXTRACTION WITH BASKET Left 06/01/2016   Procedure: STONE EXTRACTION WITH BASKET;  Surgeon: Hildred Laser, MD;  Location: [facility] ORS;  Service:  Urology;  Laterality: Left;  . TONSILLECTOMY    . TONSILLECTOMY AND ADENOIDECTOMY  age 43  . URETEROSCOPY Left 06/01/2016   Procedure: URETEROSCOPY;  Surgeon: Hildred Laser, MD;  Location: [facility] ORS;  Service: Urology;  Laterality: Left;    Family History  Problem Relation Age of Onset  . Breast cancer Mother   . Hypertension Mother   . Diabetes Father   . Hypertension Father   . Hypertension Sister   . Pancreatic disease Sister   . Diabetes Sister   . Hypertension Sister   . Hypertension Brother   . Diverticulosis Brother   . Prostate cancer Maternal Uncle   . Prostate cancer Maternal Uncle   . Dementia Maternal Grandmother   . Leukemia Maternal Grandfather   . Heart disease Paternal Grandmother   . Stomach cancer Paternal Grandfather   . Kidney cancer Neg Hx   . Bladder Cancer Neg Hx     Social History   Tobacco Use  . Smoking status: Never Smoker  . Smokeless tobacco: Never Used  Substance Use Topics  . Alcohol use: No    Alcohol/week: 0.0 standard drinks     Current Outpatient Medications:  .  ferrous sulfate 325 (65 FE) MG EC tablet, Take 325 mg by mouth daily with breakfast. (0600 & 1530), Disp: , Rfl:  .  Multiple Vitamin (MULTIVITAMIN WITH MINERALS) TABS tablet, Take 1 tablet by mouth daily at 6 (six) AM., Disp: , Rfl:  .  tadalafil (CIALIS) 20 MG tablet, Take 20 mg by mouth daily as needed for erectile dysfunction. , Disp: , Rfl:  .  telmisartan-hydrochlorothiazide (MICARDIS HCT) 80-25 MG tablet, TAKE 1 TABLET BY MOUTH DAILY, Disp: 90 tablet, Rfl: 1  No Known Allergies  I personally reviewed active problem list, medication list, allergies, family history, social history, health maintenance with the patient/caregiver today.   ROS  Constitutional: Negative for fever or weight change.  Respiratory: Negative for cough and shortness of breath.   Cardiovascular: Negative for chest pain or palpitations.  Gastrointestinal: Negative for abdominal pain, no  bowel changes.  Musculoskeletal: Negative for gait problem or joint swelling.  Skin: Negative for rash.  Neurological: Negative for dizziness or headache.  No other specific complaints in a complete review of systems (except as listed in HPI above).  Objective  Vitals:   03/22/19 0833  BP: 138/76  Pulse: 93  Resp: 18  Temp: (!) 97.3 F (36.3 C)  TempSrc: Temporal  SpO2: 96%  Weight: (!) 313 lb 11.2 oz (142.3 kg)  Height: 5' 11.75" (1.822 m)    Body mass index is 42.84 kg/m.  Physical Exam  Constitutional: Patient appears well-developed and well-nourished. Obese No distress.  HEENT: head atraumatic, normocephalic, pupils equal and reactive to light Cardiovascular: Normal rate, regular rhythm and normal heart sounds.  No murmur heard. No BLE edema. Pulmonary/Chest: Effort normal and breath sounds normal. No respiratory distress. Abdominal: Soft.  There is no tenderness. Umbilical hernia reducible  Psychiatric: Patient has a normal mood and affect. behavior is normal. Judgment and thought content normal.  PHQ2/9: Depression screen [facility] 2/9 03/22/2019 08/03/2018 07/26/2018 03/29/2018 05/17/2017  Decreased Interest 0 0 0 0 0  Down, Depressed, Hopeless 0 0 0 0 0  PHQ - 2 Score 0 0 0 0 0  Altered sleeping 0 0 0 0 -  Tired, decreased energy 0 0 0 0 -  Change in appetite 0 0 0 0 -  Feeling bad or failure about yourself  0 0 0 0 -  Trouble concentrating 0 0 0 0 -  Moving slowly or fidgety/restless 0 0 0 0 -  Suicidal thoughts 0 0 0 0 -  PHQ-9 Score 0 0 0 0 -  Difficult doing work/chores Not difficult at all - - - -    phq 9 is negative   Fall Risk: Fall Risk  03/22/2019 08/03/2018 03/29/2018 05/17/2017 05/17/2017  Falls in the past year? 0 0 0 Yes Yes  Number falls in past yr: 0 0 0 2 or more 2 or more  Injury with Fall? 0 0 0 Yes Yes  Risk Factor Category  - - - High Fall Risk High Fall Risk     Functional Status Survey: Is the patient deaf or have difficulty hearing?: No Does the  patient have difficulty seeing, even when wearing glasses/contacts?: No Does the patient have difficulty concentrating, remembering, or making decisions?: No Does the patient have difficulty walking or climbing stairs?: No Does the patient have difficulty dressing or bathing?: No Does the patient have difficulty doing errands alone such as visiting a doctor's office or shopping?: No   Assessment & Plan  1. Essential hypertension  - telmisartan-hydrochlorothiazide (MICARDIS HCT) 80-25 MG tablet; Take 1 tablet by mouth daily.  Dispense: 90 tablet; Refill: 1 - COMPLETE METABOLIC PANEL WITH GFR  2. Morbid obesity (HCC)  Discussed with the patient the risk posed by an increased BMI. Discussed importance of portion control, calorie counting and at least 150 minutes of physical activity weekly. Avoid sweet beverages and drink more water. Eat at least 6 servings of fruit and vegetables daily   3. Recurrent hematuria  - CBC with Differential/Platelet - Lipid panel  4. Insulin resistance  - Hemoglobin A1c  5. Vitamin D deficiency  - VITAMIN D 25 Hydroxy (Vit-D Deficiency, Fractures)  6. Pure hypercholesterolemia  - Lipid panel  7. Iron deficiency anemia due to chronic blood loss  - CBC with Differential/Platelet  8. Umbilical hernia without obstruction or gangrene  He will call me when ready to be referred to surgeon

## 2019-03-23 LAB — CBC WITH DIFFERENTIAL/PLATELET
Absolute Monocytes: 742 cells/uL (ref 200–950)
Basophils Absolute: 58 cells/uL (ref 0–200)
Basophils Relative: 0.9 %
Eosinophils Absolute: 51 cells/uL (ref 15–500)
Eosinophils Relative: 0.8 %
HCT: 43.4 % (ref 38.5–50.0)
Hemoglobin: 14.4 g/dL (ref 13.2–17.1)
Lymphs Abs: 1235 cells/uL (ref 850–3900)
MCH: 28.3 pg (ref 27.0–33.0)
MCHC: 33.2 g/dL (ref 32.0–36.0)
MCV: 85.4 fL (ref 80.0–100.0)
MPV: 11.2 fL (ref 7.5–12.5)
Monocytes Relative: 11.6 %
Neutro Abs: 4314 cells/uL (ref 1500–7800)
Neutrophils Relative %: 67.4 %
Platelets: 222 10*3/uL (ref 140–400)
RBC: 5.08 10*6/uL (ref 4.20–5.80)
RDW: 13.1 % (ref 11.0–15.0)
Total Lymphocyte: 19.3 %
WBC: 6.4 10*3/uL (ref 3.8–10.8)

## 2019-03-23 LAB — HEMOGLOBIN A1C
Hgb A1c MFr Bld: 5.6 % of total Hgb (ref <5.7)
Mean Plasma Glucose: 114 (calc)
eAG (mmol/L): 6.3 (calc)

## 2019-03-23 LAB — COMPLETE METABOLIC PANEL WITH GFR
AG Ratio: 1.4 (calc) (ref 1.0–2.5)
ALT: 20 U/L (ref 9–46)
AST: 15 U/L (ref 10–35)
Albumin: 3.9 g/dL (ref 3.6–5.1)
Alkaline phosphatase (APISO): 72 U/L (ref 35–144)
BUN: 13 mg/dL (ref 7–25)
CO2: 28 mmol/L (ref 20–32)
Calcium: 9.6 mg/dL (ref 8.6–10.3)
Chloride: 103 mmol/L (ref 98–110)
Creat: 0.85 mg/dL (ref 0.70–1.33)
GFR, Est African American: 117 mL/min/{1.73_m2} (ref >=60)
GFR, Est Non African American: 101 mL/min/{1.73_m2} (ref >=60)
Globulin: 2.7 g/dL (calc) (ref 1.9–3.7)
Glucose, Bld: 94 mg/dL (ref 65–99)
Potassium: 4.1 mmol/L (ref 3.5–5.3)
Sodium: 139 mmol/L (ref 135–146)
Total Bilirubin: 0.4 mg/dL (ref 0.2–1.2)
Total Protein: 6.6 g/dL (ref 6.1–8.1)

## 2019-03-23 LAB — LIPID PANEL
Cholesterol: 183 mg/dL (ref <200)
HDL: 47 mg/dL (ref >=40)
LDL Cholesterol (Calc): 116 mg/dL (calc) — ABNORMAL HIGH
Non-HDL Cholesterol (Calc): 136 mg/dL (calc) — ABNORMAL HIGH (ref <130)
Total CHOL/HDL Ratio: 3.9 (calc) (ref <5.0)
Triglycerides: 102 mg/dL (ref <150)

## 2019-03-23 LAB — VITAMIN D 25 HYDROXY (VIT D DEFICIENCY, FRACTURES): Vit D, 25-Hydroxy: 21 ng/mL — ABNORMAL LOW (ref 30–100)

## 2019-04-05 ENCOUNTER — Inpatient Hospital Stay: Payer: BC Managed Care – PPO | Attending: Oncology

## 2019-04-05 DIAGNOSIS — D500 Iron deficiency anemia secondary to blood loss (chronic): Secondary | ICD-10-CM

## 2019-04-05 DIAGNOSIS — R319 Hematuria, unspecified: Secondary | ICD-10-CM | POA: Diagnosis not present

## 2019-04-05 LAB — CBC
HCT: 41.2 % (ref 39.0–52.0)
Hemoglobin: 13.6 g/dL (ref 13.0–17.0)
MCH: 28.3 pg (ref 26.0–34.0)
MCHC: 33 g/dL (ref 30.0–36.0)
MCV: 85.7 fL (ref 80.0–100.0)
Platelets: 217 10*3/uL (ref 150–400)
RBC: 4.81 MIL/uL (ref 4.22–5.81)
RDW: 13.2 % (ref 11.5–15.5)
WBC: 7.7 10*3/uL (ref 4.0–10.5)
nRBC: 0 % (ref 0.0–0.2)

## 2019-04-05 LAB — IRON AND TIBC
Iron: 98 ug/dL (ref 45–182)
Saturation Ratios: 32 % (ref 17.9–39.5)
TIBC: 305 ug/dL (ref 250–450)
UIBC: 207 ug/dL

## 2019-04-05 LAB — FERRITIN: Ferritin: 32 ng/mL (ref 24–336)

## 2019-04-30 ENCOUNTER — Other Ambulatory Visit: Payer: Self-pay | Admitting: Family Medicine

## 2019-04-30 DIAGNOSIS — I10 Essential (primary) hypertension: Secondary | ICD-10-CM

## 2019-05-08 ENCOUNTER — Ambulatory Visit: Payer: BLUE CROSS/BLUE SHIELD | Admitting: Urology

## 2019-05-08 ENCOUNTER — Ambulatory Visit: Payer: BC Managed Care – PPO | Admitting: Urology

## 2019-05-08 ENCOUNTER — Encounter: Payer: Self-pay | Admitting: Urology

## 2019-05-08 ENCOUNTER — Other Ambulatory Visit: Payer: Self-pay

## 2019-05-08 VITALS — BP 151/92 | HR 94 | Ht 71.0 in | Wt 320.0 lb

## 2019-05-08 DIAGNOSIS — N029 Recurrent and persistent hematuria with unspecified morphologic changes: Secondary | ICD-10-CM

## 2019-05-08 NOTE — Progress Notes (Signed)
   05/08/2019 9:58 AM   Coralyn Pear Elmer Ramp 10/23/1965 [id number]  Reason for visit: Follow up chronic gross hematuria  HPI: Mr. Bannister is a 54 year old African-American male with morbid obesity and hypertension that was previously followed by Dr. Pilar Jarvis for recurrent gross hematuria.  He has a complex urologic history.  To briefly summarize, he reports 30 years of persistent pink to red urine with small clots. This has required iron supplementation however no blood transfusions.  He is completely asymptomatic and he denies any flank pain, urinary symptoms, or retention from clots.  He underwent extensive work-up previously including a negative renal biopsy in 2000 at [facility], cystoscopy(noted to have localizing left sided hematuria from left ureteral orifice), retrograde pyelograms, left diagnostic ureteroscopy in 2018 with Dr. Pilar Jarvis without any abnormal findings, and 42m renal stone removal, as well as left sided angiography with interventional radiology in July 2018 which was essentially negative.  There was a possibility of a chronic nephritis or vasculitis, but no AV malformation or active extravasation.  Renal function is normal with creatinine 0.85, EGFR greater than 60.  Urinalysis today >30 RBCs, otherwise benign.  He overall is doing well, and is not bothered by his hematuria.  He denies any urinary symptoms.  He is a non-smoker.  He reports that since he got his Covid vaccination 1 month ago, his gross hematuria has almost completely resolved.  We discussed at length that I do not have a good answer for the correlation between the vaccination and cessation of his gross hematuria.  At this point, with his chronicity of gross hematuria I did not recommend any further intervention at this time, especially with his hematuria resolved over the last month.  We discussed return precautions including large clots, urinary retention, or renal colic.  Continue yearly follow-up, return precautions discussed   I spent 30 total minutes on the day of the encounter including pre-visit review of the medical record, face-to-face time with the patient, and post visit ordering of labs/imaging/tests.  [facility] [address] [place] [phone]

## 2019-05-09 LAB — MICROSCOPIC EXAMINATION
Bacteria, UA: NONE SEEN
Epithelial Cells (non renal): NONE SEEN /hpf (ref 0–10)
RBC, Urine: >30 /hpf — AB (ref 0–2)
WBC, UA: NONE SEEN /hpf (ref 0–5)

## 2019-05-09 LAB — URINALYSIS, COMPLETE
Bilirubin, UA: NEGATIVE
Glucose, UA: NEGATIVE
Ketones, UA: NEGATIVE
Leukocytes,UA: NEGATIVE
Nitrite, UA: NEGATIVE
Protein,UA: NEGATIVE
Specific Gravity, UA: 1.025 (ref 1.005–1.030)
Urobilinogen, Ur: 0.2 mg/dL (ref 0.2–1.0)
pH, UA: 6 (ref 5.0–7.5)

## 2019-07-23 ENCOUNTER — Other Ambulatory Visit: Payer: Self-pay | Admitting: Family Medicine

## 2019-07-23 MED ORDER — TADALAFIL 20 MG PO TABS: 20.0000 mg | ORAL_TABLET | Freq: Every day | ORAL | 0 refills | Status: DC | PRN

## 2019-07-23 NOTE — Telephone Encounter (Signed)
Refill request for Cialis   [id number] - [place], [place] - [address] AT [facility] OF [address] & [address] [facility]

## 2019-07-23 NOTE — Telephone Encounter (Signed)
Requested medication (s) are due for refill today: Yes  Requested medication (s) are on the active medication list: Yes  Last refill:  05/18/16  Future visit scheduled: Yes  Notes to clinic:  Unable to refill per protocol, last refilled by another provider     Requested Prescriptions  Pending Prescriptions Disp Refills   tadalafil (CIALIS) 20 MG tablet 10 tablet 0    Sig: Take 1 tablet (20 mg total) by mouth daily as needed for erectile dysfunction.      Urology: Erectile Dysfunction Agents Failed - 07/23/2019 10:54 AM      Failed - Last BP in normal range    BP Readings from Last 1 Encounters:  05/08/19 (!) 151/92          Passed - Valid encounter within last 12 months    Recent Outpatient Visits           4 months ago Morbid obesity [facility])   [facility] [name], MD   11 months ago Skin tag   [facility] [name], MD   12 months ago Encounter for routine history and physical exam for male   [facility] [name], MD   1 year ago Morbid obesity [facility])   [facility] [name], MD   2 years ago Strain of right biceps, initial encounter   [facility] [place]       Future Appointments             In 1 month [name], MD [facility]   In 9 months Richardo Hanks, Laurette Schimke, MD [facility]

## 2019-08-05 ENCOUNTER — Encounter: Payer: Self-pay | Admitting: Oncology

## 2019-08-05 ENCOUNTER — Inpatient Hospital Stay (HOSPITAL_BASED_OUTPATIENT_CLINIC_OR_DEPARTMENT_OTHER): Payer: BC Managed Care – PPO | Admitting: Oncology

## 2019-08-05 ENCOUNTER — Other Ambulatory Visit: Payer: Self-pay

## 2019-08-05 ENCOUNTER — Inpatient Hospital Stay: Payer: BC Managed Care – PPO | Attending: Oncology

## 2019-08-05 VITALS — BP 137/91 | HR 87 | Temp 98.5°F | Resp 16 | Ht 71.0 in | Wt 319.4 lb

## 2019-08-05 DIAGNOSIS — E669 Obesity, unspecified: Secondary | ICD-10-CM | POA: Diagnosis not present

## 2019-08-05 DIAGNOSIS — Z833 Family history of diabetes mellitus: Secondary | ICD-10-CM | POA: Insufficient documentation

## 2019-08-05 DIAGNOSIS — Z79899 Other long term (current) drug therapy: Secondary | ICD-10-CM | POA: Insufficient documentation

## 2019-08-05 DIAGNOSIS — Z87442 Personal history of urinary calculi: Secondary | ICD-10-CM | POA: Diagnosis not present

## 2019-08-05 DIAGNOSIS — D509 Iron deficiency anemia, unspecified: Secondary | ICD-10-CM

## 2019-08-05 DIAGNOSIS — Z806 Family history of leukemia: Secondary | ICD-10-CM | POA: Insufficient documentation

## 2019-08-05 DIAGNOSIS — Z8249 Family history of ischemic heart disease and other diseases of the circulatory system: Secondary | ICD-10-CM | POA: Diagnosis not present

## 2019-08-05 DIAGNOSIS — I10 Essential (primary) hypertension: Secondary | ICD-10-CM | POA: Insufficient documentation

## 2019-08-05 DIAGNOSIS — G4733 Obstructive sleep apnea (adult) (pediatric): Secondary | ICD-10-CM | POA: Insufficient documentation

## 2019-08-05 DIAGNOSIS — Z8042 Family history of malignant neoplasm of prostate: Secondary | ICD-10-CM | POA: Diagnosis not present

## 2019-08-05 DIAGNOSIS — D500 Iron deficiency anemia secondary to blood loss (chronic): Secondary | ICD-10-CM

## 2019-08-05 DIAGNOSIS — Z800 Family history of malignant neoplasm of digestive organs: Secondary | ICD-10-CM | POA: Insufficient documentation

## 2019-08-05 DIAGNOSIS — Z803 Family history of malignant neoplasm of breast: Secondary | ICD-10-CM | POA: Diagnosis not present

## 2019-08-05 LAB — CBC
HCT: 46.2 % (ref 39.0–52.0)
Hemoglobin: 15.6 g/dL (ref 13.0–17.0)
MCH: 28.5 pg (ref 26.0–34.0)
MCHC: 33.8 g/dL (ref 30.0–36.0)
MCV: 84.3 fL (ref 80.0–100.0)
Platelets: 206 10*3/uL (ref 150–400)
RBC: 5.48 MIL/uL (ref 4.22–5.81)
RDW: 13.5 % (ref 11.5–15.5)
WBC: 7.7 10*3/uL (ref 4.0–10.5)
nRBC: 0 % (ref 0.0–0.2)

## 2019-08-05 LAB — IRON AND TIBC
Iron: 83 ug/dL (ref 45–182)
Saturation Ratios: 27 % (ref 17.9–39.5)
TIBC: 305 ug/dL (ref 250–450)
UIBC: 222 ug/dL

## 2019-08-05 LAB — FERRITIN: Ferritin: 62 ng/mL (ref 24–336)

## 2019-08-05 NOTE — Progress Notes (Signed)
Hematology/Oncology Consult note [facility]  [phone] [phone]  Patient Care Team: [name], MD as PCP - General (Family Medicine) Creig Hines, MD as Consulting Physician (Oncology) Sondra Come, MD as Consulting Physician (Urology)   Name of the patient: Angel Chase  [id number]  1965/06/27   Date of visit: 08/05/19  Diagnosis-iron deficiency anemia  Chief complaint/ Reason for visit-routine follow-up of iron deficiency anemia  Heme/Onc history: Patient is a 54 year old male who has a long standing history of hematuria. He has been seen by both nephrology and urology in the past to evaluate his hematuria and states he underwent extensive testing but no cause for hematuria was found. He has persistent hematuria all along sometimes tea colored sometimes bright red but was never anemic in the past. No family h/o bleeding disorders. No gum bleeds, nose bleeds or bleeding in his stool. He has had ankle surgery in the past without any bleeding issues.  He has been referred to [place] for evaluation and management of anemia. Most recent CBC from 04/28/2016 showed white count of 6.8, H&H of 10.9/35.9 with an MCV of 72.5 and a platelet count of 295. Iron studies revealed a low serum iron of 18 and low iron saturation of 4%. TIBC was high normal at 407. Ferritin was low at 6. Patient is yet to see urology for hematuria. He has also been referred to [facility] h/o diverticulitis.   Also seen by Dr. Tobi Bastos from GI and underwent EGD and colonoscopy which was unremarkable  He received 2 doses of Feraheme in June 2019  Interval history-patient reports that his hematuria which she typically gets once a week has reduced further after his Covid vaccine.  Energy levels are good.  Appetite and weight is stable.  Denies any blood loss in his stool  ECOG PS- 0 Pain scale- 0   Review of systems- Review of Systems  Constitutional: Negative for  chills, fever, malaise/fatigue and weight loss.  HENT: Negative for congestion, ear discharge and nosebleeds.   Eyes: Negative for blurred vision.  Respiratory: Negative for cough, hemoptysis, sputum production, shortness of breath and wheezing.   Cardiovascular: Negative for chest pain, palpitations, orthopnea and claudication.  Gastrointestinal: Negative for abdominal pain, blood in stool, constipation, diarrhea, heartburn, melena, nausea and vomiting.  Genitourinary: Positive for hematuria. Negative for dysuria, flank pain, frequency and urgency.  Musculoskeletal: Negative for back pain, joint pain and myalgias.  Skin: Negative for rash.  Neurological: Negative for dizziness, tingling, focal weakness, seizures, weakness and headaches.  Endo/Heme/Allergies: Does not bruise/bleed easily.  Psychiatric/Behavioral: Negative for depression and suicidal ideas. The patient does not have insomnia.      No Known Allergies   Past Medical History:  Diagnosis Date  . Anemia   . ED (erectile dysfunction)   . EKG abnormalities   . Hematuria   . Hematuria    x 20 yr per pt  . History of kidney stones   . Hypertension   . Obesity   . Obstructive sleep apnea   . Vitamin D deficiency      Past Surgical History:  Procedure Laterality Date  . ANKLE SURGERY Left 03/07/2011   fracture from trauma  . COLONOSCOPY WITH PROPOFOL N/A 07/01/2016   Procedure: COLONOSCOPY WITH PROPOFOL;  Surgeon: Wyline Mood, MD;  Location: [facility] ENDOSCOPY;  Service: Endoscopy;  Laterality: N/A;  . CYSTOSCOPY W/ RETROGRADES Bilateral 06/01/2016   Procedure: CYSTOSCOPY WITH RETROGRADE PYELOGRAM;  Surgeon: Hildred Laser, MD;  Location: [facility] ORS;  Service: Urology;  Laterality: Bilateral;  . CYSTOSCOPY WITH STENT PLACEMENT Left 06/01/2016   Procedure: CYSTOSCOPY WITH STENT PLACEMENT;  Surgeon: Hildred Laser, MD;  Location: [facility] ORS;  Service: Urology;  Laterality: Left;  . ESOPHAGOGASTRODUODENOSCOPY (EGD) WITH  PROPOFOL N/A 07/01/2016   Procedure: ESOPHAGOGASTRODUODENOSCOPY (EGD) WITH PROPOFOL;  Surgeon: Wyline Mood, MD;  Location: [facility] ENDOSCOPY;  Service: Endoscopy;  Laterality: N/A;  . FLEXIBLE SIGMOIDOSCOPY N/A 02/24/2017   Procedure: FLEXIBLE SIGMOIDOSCOPY;  Surgeon: Wyline Mood, MD;  Location: [facility] ENDOSCOPY;  Service: Gastroenterology;  Laterality: N/A;  . IR RADIOLOGIST EVAL & MGMT  06/16/2016  . IR RENAL SELECTIVE  UNI INC S&I MOD SED  07/28/2016  . IR US GUIDE VASC ACCESS LEFT  07/28/2016  . STONE EXTRACTION WITH BASKET Left 06/01/2016   Procedure: STONE EXTRACTION WITH BASKET;  Surgeon: Hildred Laser, MD;  Location: [facility] ORS;  Service: Urology;  Laterality: Left;  . TONSILLECTOMY    . TONSILLECTOMY AND ADENOIDECTOMY  age 64  . URETEROSCOPY Left 06/01/2016   Procedure: URETEROSCOPY;  Surgeon: Hildred Laser, MD;  Location: [facility] ORS;  Service: Urology;  Laterality: Left;    Social History   Socioeconomic History  . Marital status: Married    Spouse name: Sherrie  . Number of children: 1  . Years of education: Not on file  . Highest education level: 12th grade  Occupational History  . Occupation: Education officer, community  Tobacco Use  . Smoking status: Never Smoker  . Smokeless tobacco: Never Used  Vaping Use  . Vaping Use: Never used  Substance and Sexual Activity  . Alcohol use: No    Alcohol/week: 0.0 standard drinks  . Drug use: No  . Sexual activity: Yes    Partners: Female    Comment: wife hysterectomy   Other Topics Concern  . Not on file  Social History Narrative  . Not on file   Social Determinants of Health   Financial Resource Strain:   . Difficulty of Paying Living Expenses:   Food Insecurity:   . Worried About Programme researcher, broadcasting/film/video in the Last Year:   . Barista in the Last Year:   Transportation Needs:   . Freight forwarder (Medical):   Marland Kitchen Lack of Transportation (Non-Medical):   Physical Activity:   . Days of Exercise per Week:   .  Minutes of Exercise per Session:   Stress:   . Feeling of Stress :   Social Connections:   . Frequency of Communication with Friends and Family:   . Frequency of Social Gatherings with Friends and Family:   . Attends Religious Services:   . Active Member of Clubs or Organizations:   . Attends Banker Meetings:   Marland Kitchen Marital Status:   Intimate Partner Violence:   . Fear of Current or Ex-Partner:   . Emotionally Abused:   Marland Kitchen Physically Abused:   . Sexually Abused:     Family History  Problem Relation Age of Onset  . Breast cancer Mother   . Hypertension Mother   . Diabetes Father   . Hypertension Father   . Hypertension Sister   . Pancreatic disease Sister   . Diabetes Sister   . Hypertension Sister   . Hypertension Brother   . Diverticulosis Brother   . Prostate cancer Maternal Uncle   . Prostate cancer Maternal Uncle   . Dementia Maternal Grandmother   . Leukemia Maternal Grandfather   . Heart disease  Paternal Grandmother   . Stomach cancer Paternal Grandfather   . Kidney cancer Neg Hx   . Bladder Cancer Neg Hx      Current Outpatient Medications:  .  ferrous sulfate 325 (65 FE) MG EC tablet, Take 325 mg by mouth daily with breakfast. (0600 & 1530), Disp: , Rfl:  .  Multiple Vitamin (MULTIVITAMIN WITH MINERALS) TABS tablet, Take 1 tablet by mouth daily at 6 (six) AM., Disp: , Rfl:  .  tadalafil (CIALIS) 20 MG tablet, Take 1 tablet (20 mg total) by mouth daily as needed for erectile dysfunction., Disp: 10 tablet, Rfl: 0 .  telmisartan-hydrochlorothiazide (MICARDIS HCT) 80-25 MG tablet, TAKE 1 TABLET BY MOUTH DAILY, Disp: 90 tablet, Rfl: 1  Physical exam:  Vitals:   08/05/19 1007  BP: (!) 137/91  Pulse: 87  Resp: 16  Temp: 98.5 F (36.9 C)  TempSrc: Oral  Weight: (!) 319 lb 6.4 oz (144.9 kg)  Height: 5\' 11"  (1.803 m)   Physical Exam Constitutional:      General: He is not in acute distress. Pulmonary:     Effort: Pulmonary effort is normal.    Skin:    General: Skin is warm and dry.  Neurological:     Mental Status: He is alert and oriented to person, place, and time.      CMP Latest Ref Rng & Units 03/22/2019  Glucose 65 - 99 mg/dL 94  BUN 7 - 25 mg/dL 13  Creatinine 05/22/2019 - 6.04 mg/dL 5.40  Sodium 9.81 - 191 mmol/L 139  Potassium 3.5 - 5.3 mmol/L 4.1  Chloride 98 - 110 mmol/L 103  CO2 20 - 32 mmol/L 28  Calcium 8.6 - 10.3 mg/dL 9.6  Total Protein 6.1 - 8.1 g/dL 6.6  Total Bilirubin 0.2 - 1.2 mg/dL 0.4  Alkaline Phos 40 - 115 U/L -  AST 10 - 35 U/L 15  ALT 9 - 46 U/L 20   CBC Latest Ref Rng & Units 08/05/2019  WBC 4.0 - 10.5 K/uL 7.7  Hemoglobin 13.0 - 17.0 g/dL 08/07/2019  Hematocrit 39 - 52 % 46.2  Platelets 150 - 400 K/uL 206    Assessment and plan- Patient is a 54 y.o. male with iron deficiency anemia secondary to intermittent hematuria here for routine follow-up  Patient's hemoglobin has remained stable between 14-15 over the last 2 years.  He has not required any IV iron since 2019.  His iron results tend to run low at times but it has not resulted in overt anemia.  Patient is continuing to take oral iron pills.  He therefore does not require any follow-up with me at this time and can continue to follow-up with Dr. 2020 and get his iron levels and hemoglobin checked every 6 months.  If he were to get significantly anemic in the future he can be referred back to me.   Visit Diagnosis 1. Iron deficiency anemia, unspecified iron deficiency anemia type      Dr. Carlynn Purl, MD, MPH [facility] 08/05/2019 10:47 AM

## 2019-09-19 NOTE — Progress Notes (Signed)
Name: Angel Chase   MRN: [id number]    DOB: 04/27/1965   Date:09/20/2019       Progress Note  Subjective  Chief Complaint  Physical Exam   HPI  Patient presents for annual CPE and follow up  HTN: he is compliant with medication and denies side effects, bp has been at goal but reminded him to follow a low salt diet and increase physical activity. He denies chest pain or palpitation  Hyperlipidemia: discussed ASCVD score and patient is willing to try medication   The 10-year ASCVD risk score Denman George DC Jr., et al., 2013) is: 10.7%   Values used to calculate the score:     Age: 54 years     Sex: Male     Is Non-Hispanic African American: Yes     Diabetic: No     Tobacco smoker: No     Systolic Blood Pressure: 136 mmHg     Is BP treated: Yes     HDL Cholesterol: 47 mg/dL     Total Cholesterol: 183 mg/dL  Insulin resistance/morbid obesity: he denies polyphagia, polydipsia or polyuria , he is on a low carbohydrate diet at this time since last week.   Hematuria: extensive evaluation by Urologist, nephrologist and hematologist, doing better, only had twice in the past month and not lasting like it used to. 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  Never  2)  How often have you had to urinate again less than two hours after you finished urinating? Rarely  3)  How often have you found you stopped and started again several times when you urinated?  Never  4) How difficult have you found it to postpone urination?  Not difficult  5) How often have you had a weak urinary stream?  Never  6) How often have you had to push or strain to begin urination?   Never  7) How many times did you most typically get up to urinate from the time you went to bed until the time you got up in the morning?  1  Total score:  0-7 mildly symptomatic   8-19 moderately symptomatic   20-35 severely symptomatic    Depression: phq 9 is negative Depression screen [facility] 2/9 09/20/2019 03/22/2019 08/03/2018 07/26/2018 03/29/2018  Decreased Interest 0 0 0 0 0  Down, Depressed, Hopeless 0 0 0 0 0  PHQ - 2 Score 0 0 0 0 0  Altered sleeping 0 0 0 0 0  Tired, decreased energy 0 0 0 0 0  Change in appetite 0 0 0 0 0  Feeling bad or failure about yourself  0 0 0 0 0  Trouble concentrating 0 0 0 0 0  Moving slowly or fidgety/restless 0 0 0 0 0  Suicidal thoughts 0 0 0 0 0  PHQ-9 Score 0 0 0 0 0  Difficult doing work/chores - Not difficult at all - - -    Hypertension:  BP Readings from Last 3 Encounters:  09/20/19 136/86  08/05/19 (!) 137/91  05/08/19 (!) 151/92    Obesity: Wt Readings from Last  3 Encounters:  09/20/19 (!) 315 lb 14.4 oz (143.3 kg)  08/05/19 (!) 319 lb 6.4 oz (144.9 kg)  05/08/19 (!) 320 lb (145.2 kg)   BMI Readings from Last 3 Encounters:  09/20/19 44.06 kg/m  08/05/19 44.55 kg/m  05/08/19 44.63 kg/m     Lipids:  Lab Results  Component Value Date   CHOL 183 03/22/2019   CHOL 180 03/29/2018   CHOL 189 05/17/2017   Lab Results  Component Value Date   HDL 47 03/22/2019   HDL 48 03/29/2018   HDL 49 05/17/2017   Lab Results  Component Value Date   LDLCALC 116 (H) 03/22/2019   LDLCALC 116 (H) 03/29/2018   LDLCALC 122 (H) 05/17/2017   Lab Results  Component Value Date   TRIG 102 03/22/2019   TRIG 69 03/29/2018   TRIG 79 05/17/2017   Lab Results  Component Value Date   CHOLHDL 3.9 03/22/2019   CHOLHDL 3.8 03/29/2018   CHOLHDL 3.9 05/17/2017   No results found for: LDLDIRECT Glucose:  Glucose  Date Value Ref Range Status  03/04/2011 88 65 - 99 mg/dL Final   Glucose, Bld  Date Value Ref Range Status  03/22/2019 94 65 - 99  mg/dL Final    Comment:    .            Fasting reference interval .   03/29/2018 87 65 - 99 mg/dL Final    Comment:    .            Fasting reference interval .   05/17/2017 89 65 - 99 mg/dL Final    Comment:    .            Fasting reference interval .       [facility] Visit from 09/20/2019 in [facility]  AUDIT-C Score 0      Married STD testing and prevention (HIV/chl/gon/syphilis): only HIV for our records Hep C: 07/26/2018  Skin cancer: Discussed monitoring for atypical lesions Colorectal cancer: 07/01/2016 Prostate cancer:  Lab Results  Component Value Date   PSA 0.5 07/26/2018   PSA 0.5 05/17/2017   PSA 0.7 03/28/2016    Lung cancer:  Low Dose CT Chest recommended if Age 48-80 years, 30 pack-year currently smoking OR have quit w/in 15years. Patient does not qualify.   AAA:  The USPSTF recommends one-time screening with ultrasonography in men ages 40 to 42 years who have ever smoked ECG:  2018   Advanced Care Planning: A voluntary discussion about advance care planning including the explanation and discussion of advance directives.  Discussed health care proxy and Living will, and the patient was able to identify a health care proxy as wife.  Patient does not have a living will at present time.  Vaccines:  Shingrix: 71-64 yo and ask insurance if covered when patient above 34 yo Flu: educated and discussed with patient. He will get it today    Patient Active Problem List   Diagnosis Date Noted   Insulin resistance 07/26/2018   Recurrent hematuria 03/29/2018   Iron deficiency anemia due to chronic blood loss 03/29/2018   Pure hypercholesterolemia 03/29/2018   History of kidney stones 05/17/2017   Abnormal ECG 05/30/2016   History of iron deficiency anemia 04/28/2016   History of diverticulitis 03/04/2016   Hyperlipidemia 01/27/2015   Hematuria, gross 07/10/2014   Failure of erection 07/10/2014   Hypertension, benign  07/10/2014   Morbid obesity (HCC) 10/09/2013   Vitamin D deficiency  10/09/2013    Past Surgical History:  Procedure Laterality Date   ANKLE SURGERY Left 03/07/2011   fracture from trauma   COLONOSCOPY WITH PROPOFOL N/A 07/01/2016   Procedure: COLONOSCOPY WITH PROPOFOL;  Surgeon: Wyline Mood, MD;  Location: [facility] ENDOSCOPY;  Service: Endoscopy;  Laterality: N/A;   CYSTOSCOPY W/ RETROGRADES Bilateral 06/01/2016   Procedure: CYSTOSCOPY WITH RETROGRADE PYELOGRAM;  Surgeon: Hildred Laser, MD;  Location: [facility] ORS;  Service: Urology;  Laterality: Bilateral;   CYSTOSCOPY WITH STENT PLACEMENT Left 06/01/2016   Procedure: CYSTOSCOPY WITH STENT PLACEMENT;  Surgeon: Hildred Laser, MD;  Location: [facility] ORS;  Service: Urology;  Laterality: Left;   ESOPHAGOGASTRODUODENOSCOPY (EGD) WITH PROPOFOL N/A 07/01/2016   Procedure: ESOPHAGOGASTRODUODENOSCOPY (EGD) WITH PROPOFOL;  Surgeon: Wyline Mood, MD;  Location: [facility] ENDOSCOPY;  Service: Endoscopy;  Laterality: N/A;   FLEXIBLE SIGMOIDOSCOPY N/A 02/24/2017   Procedure: FLEXIBLE SIGMOIDOSCOPY;  Surgeon: Wyline Mood, MD;  Location[facility] ENDOSCOPY;  Service: Gastroenterology;  Laterality: N/A;   IR RADIOLOGIST EVAL & MGMT  06/16/2016   IR RENAL SELECTIVE  UNI INC S&I MOD SED  07/28/2016   IR US GUIDE VASC ACCESS LEFT  07/28/2016   STONE EXTRACTION WITH BASKET Left 06/01/2016   Procedure: STONE EXTRACTION WITH BASKET;  Surgeon: Hildred Laser, MD;  Location: [facility] ORS;  Service: Urology;  Laterality: Left;   TONSILLECTOMY     TONSILLECTOMY AND ADENOIDECTOMY  age 43   URETEROSCOPY Left 06/01/2016   Procedure: URETEROSCOPY;  Surgeon: Hildred Laser, MD;  Location: [facility] ORS;  Service: Urology;  Laterality: Left;    Family History  Problem Relation Age of Onset   Breast cancer Mother    Hypertension Mother    Diabetes Father    Hypertension Father    Hypertension Sister    Pancreatic disease Sister    Diabetes Sister     Hypertension Sister    Hypertension Brother    Diverticulosis Brother    Prostate cancer Maternal Uncle    Prostate cancer Maternal Uncle    Dementia Maternal Grandmother    Leukemia Maternal Grandfather    Heart disease Paternal Grandmother    Stomach cancer Paternal Grandfather    Kidney cancer Neg Hx    Bladder Cancer Neg Hx     Social History   Socioeconomic History   Marital status: Married    Spouse name: Sherrie   Number of children: 1   Years of education: Not on file   Highest education level: 12th grade  Occupational History   Occupation: Education officer, community  Tobacco Use   Smoking status: Never Smoker   Smokeless tobacco: Never Used  Building services engineer Use: Never used  Substance and Sexual Activity   Alcohol use: No    Alcohol/week: 0.0 standard drinks   Drug use: No   Sexual activity: Yes    Partners: Female    Comment: wife hysterectomy   Other Topics Concern   Not on file  Social History Narrative   Not on file   Social Determinants of Health   Financial Resource Strain: Low Risk    Difficulty of Paying Living Expenses: Not hard at all  Food Insecurity: No Food Insecurity   Worried About Programme researcher, broadcasting/film/video in the Last Year: Never true   Ran Out of Food in the Last Year: Never true  Transportation Needs: No Transportation Needs   Lack of Transportation (Medical): No   Lack of Transportation (Non-Medical): No  Physical Activity: Inactive  Days of Exercise per Week: 0 days   Minutes of Exercise per Session: 0 min  Stress: No Stress Concern Present   Feeling of Stress : Not at all  Social Connections: Moderately Integrated   Frequency of Communication with Friends and Family: More than three times a week   Frequency of Social Gatherings with Friends and Family: Twice a week   Attends Religious Services: More than 4 times per year   Active Member of [employer] or Organizations: No   Attends Museum/gallery exhibitions officer: Never   Marital Status: Married  Catering manager Violence: Not At Risk   Fear of Current or Ex-Partner: No   Emotionally Abused: No   Physically Abused: No   Sexually Abused: No     Current Outpatient Medications:    ferrous sulfate 325 (65 FE) MG EC tablet, Take 325 mg by mouth daily with breakfast. (0600 & 1530), Disp: , Rfl:    Multiple Vitamin (MULTIVITAMIN WITH MINERALS) TABS tablet, Take 1 tablet by mouth daily at 6 (six) AM., Disp: , Rfl:    tadalafil (CIALIS) 20 MG tablet, Take 1 tablet (20 mg total) by mouth daily as needed for erectile dysfunction., Disp: 10 tablet, Rfl: 0   telmisartan-hydrochlorothiazide (MICARDIS HCT) 80-25 MG tablet, TAKE 1 TABLET BY MOUTH DAILY, Disp: 90 tablet, Rfl: 1   Cholecalciferol (VITAMIN D) 50 MCG (2000 UT) CAPS, Take 1 capsule (2,000 Units total) by mouth daily., Disp: 30 capsule, Rfl: 11  No Known Allergies   ROS  Constitutional: Negative for fever or weight change.  Respiratory: Negative for cough and shortness of breath.   Cardiovascular: Negative for chest pain or palpitations.  Gastrointestinal: Negative for abdominal pain, no bowel changes.  Musculoskeletal: Negative for gait problem or joint swelling.  Skin: Negative for rash.  Neurological: Negative for dizziness or headache.  No other specific complaints in a complete review of systems (except as listed in HPI above).  Objective  Vitals:   09/20/19 0911  BP: 136/86  Pulse: 92  Resp: 18  Temp: 98 F (36.7 C)  TempSrc: Oral  SpO2: 96%  Weight: (!) 315 lb 14.4 oz (143.3 kg)  Height:  (1.803 m)    Body mass index is 44.06 kg/m.  Physical Exam  Constitutional: Patient appears well-developed and well-nourished. No distress.  HENT: Head: Normocephalic and atraumatic. Ears: B TMs ok, no erythema or effusion; Nose: Not done. Mouth/Throat: not done  Eyes: Conjunctivae and EOM are normal. Pupils are equal, round, and reactive to light.  No scleral icterus.  Neck: Normal range of motion. Neck supple. No JVD present. No thyromegaly present.  Cardiovascular: Normal rate, regular rhythm and normal heart sounds.  No murmur heard. No BLE edema. Pulmonary/Chest: Effort normal and breath sounds normal. No respiratory distress. Abdominal: Soft. Bowel sounds are normal, no distension, large umbilical hernia . There is no tenderness. no masses MALE GENITALIA: Normal descended testes bilaterally, no masses palpated, no inguinal  hernias, no lesions, no discharge RECTAL: not done Musculoskeletal: Normal range of motion, no joint effusions. No gross deformities Neurological: he is alert and oriented to person, place, and time. No cranial nerve deficit. Coordination, balance, strength, speech and gait are normal.  Skin: Skin is warm and dry. No rash noted. No erythema.  Psychiatric: Patient has a normal mood and affect. behavior is normal. Judgment and thought content normal.  Recent Results (from the past 2160 hour(s))  Iron and TIBC     Status: None   Collection  Time: 08/05/19  9:51 AM  Result Value Ref Range   Iron 83 45 - 182 ug/dL   TIBC 211 941 - 740 ug/dL   Saturation Ratios 27 17.9 - 39.5 %   UIBC 222 ug/dL    Comment: Performed at [facility], [address] [place], [place] [address]  Ferritin     Status: None   Collection Time: 08/05/19  9:51 AM  Result Value Ref Range   Ferritin 62 24 - 336 ng/mL    Comment: Performed at [facility], [address] [place], [place] [address]  CBC     Status: None   Collection Time: 08/05/19  9:51 AM  Result Value Ref Range   WBC 7.7 4.0 - 10.5 K/uL   RBC 5.48 4.22 - 5.81 MIL/uL   Hemoglobin 15.6 13.0 - 17.0 g/dL   HCT 14.9 39 - 52 %   MCV 84.3 80.0 - 100.0 fL   MCH 28.5 26.0 - 34.0 pg   MCHC 33.8 30.0 - 36.0 g/dL   RDW 70.2 63.7 - 85.8 %   Platelets 206 150 - 400 K/uL   nRBC 0.0 0.0 - 0.2 %    Comment: Performed at [facility], [address] [place], [place] [address]     Fall Risk: Fall Risk  09/20/2019 03/22/2019 08/03/2018 03/29/2018 05/17/2017  Falls in the past year? 0 0 0 0 Yes  Number falls in past yr: 1 0 0 0 2 or more  Injury with Fall? 0 0 0 0 Yes  Risk Factor Category  - - - - High Fall Risk  Risk for fall due to : History of fall(s) - - - -     Functional Status Survey: Is the patient deaf or have difficulty hearing?: No Does the patient have difficulty seeing, even when wearing glasses/contacts?: No Does the patient have difficulty concentrating, remembering, or making decisions?: No Does the patient have difficulty walking or climbing stairs?: No Does the patient have difficulty dressing or bathing?: No Does the patient have difficulty doing errands alone such as visiting a doctor's office or shopping?: No    Assessment & Plan  1. Encounter for screening for HIV  - HIV Antibody (routine testing w rflx)  2. Well adult exam   3. Need for shingles vaccine  - Varicella-zoster vaccine IM (Shingrix)  4. Needs flu shot  - Flu Vaccine QUAD 6+ mos PF IM (Fluarix Quad PF)  5. Essential hypertension  - telmisartan-hydrochlorothiazide (MICARDIS HCT) 80-25 MG tablet; Take 1 tablet by mouth daily.  Dispense: 90 tablet; Refill: 1  6. Morbid obesity (HCC)  Discussed with the patient the risk posed by an increased BMI. Discussed importance of portion control, calorie counting and at least 150 minutes of physical activity weekly. Avoid sweet beverages and drink more water. Eat at least 6 servings of fruit and vegetables daily   7. Vitamin D deficiency  - Cholecalciferol (VITAMIN D) 50 MCG (2000 UT) CAPS; Take 1 capsule (2,000 Units total) by mouth daily.  Dispense: 30 capsule; Refill: 11  8. Pure hypercholesterolemia  - rosuvastatin (CRESTOR) 5 MG tablet; Take 1 tablet (5 mg total) by mouth daily.  Dispense: 90 tablet; Refill: 1  9. Insulin resistance  Recheck labs next visit   10. Prostate cancer  screening  Discussed USPTF  11. Umbilical hernia without obstruction or gangrene  Discussed need to go to [facility]  12. History of iron deficiency anemia  Dr. Smith Robert released from her  care, to get labs here yearly  -Prostate cancer screening and PSA options (with potential risks and benefits of testing vs not testing) were discussed along with recent recs/guidelines. -USPSTF grade A and B recommendations reviewed with patient; age-appropriate recommendations, preventive care, screening tests, etc discussed and encouraged; healthy living encouraged; see AVS for patient education given to patient -Discussed importance of 150 minutes of physical activity weekly, eat two servings of fish weekly, eat one serving of tree nuts ( cashews, pistachios, pecans, almonds.Marland Kitchen.) every other day, eat 6 servings of fruit/vegetables daily and drink plenty of water and avoid sweet beverages.

## 2019-09-20 ENCOUNTER — Encounter: Payer: Self-pay | Admitting: Family Medicine

## 2019-09-20 ENCOUNTER — Other Ambulatory Visit: Payer: Self-pay

## 2019-09-20 ENCOUNTER — Ambulatory Visit (INDEPENDENT_AMBULATORY_CARE_PROVIDER_SITE_OTHER): Payer: BC Managed Care – PPO | Admitting: Family Medicine

## 2019-09-20 DIAGNOSIS — E559 Vitamin D deficiency, unspecified: Secondary | ICD-10-CM

## 2019-09-20 DIAGNOSIS — E7800 Pure hypercholesterolemia, unspecified: Secondary | ICD-10-CM

## 2019-09-20 DIAGNOSIS — Z0000 Encounter for general adult medical examination without abnormal findings: Secondary | ICD-10-CM | POA: Diagnosis not present

## 2019-09-20 DIAGNOSIS — Z23 Encounter for immunization: Secondary | ICD-10-CM

## 2019-09-20 DIAGNOSIS — K429 Umbilical hernia without obstruction or gangrene: Secondary | ICD-10-CM

## 2019-09-20 DIAGNOSIS — I10 Essential (primary) hypertension: Secondary | ICD-10-CM

## 2019-09-20 DIAGNOSIS — E8881 Metabolic syndrome: Secondary | ICD-10-CM

## 2019-09-20 DIAGNOSIS — Z862 Personal history of diseases of the blood and blood-forming organs and certain disorders involving the immune mechanism: Secondary | ICD-10-CM

## 2019-09-20 DIAGNOSIS — Z114 Encounter for screening for human immunodeficiency virus [HIV]: Secondary | ICD-10-CM

## 2019-09-20 DIAGNOSIS — Z125 Encounter for screening for malignant neoplasm of prostate: Secondary | ICD-10-CM

## 2019-09-20 MED ORDER — TELMISARTAN-HCTZ 80-25 MG PO TABS: 1.0000 | ORAL_TABLET | Freq: Every day | ORAL | 1 refills | Status: DC

## 2019-09-20 MED ORDER — VITAMIN D 50 MCG (2000 UT) PO CAPS: 1.0000 | ORAL_CAPSULE | Freq: Every day | ORAL | 11 refills | Status: AC

## 2019-09-20 MED ORDER — ROSUVASTATIN CALCIUM 5 MG PO TABS: 5.0000 mg | ORAL_TABLET | Freq: Every day | ORAL | 1 refills | Status: DC

## 2019-09-20 NOTE — Patient Instructions (Signed)
Preventive Care 41-52 Years Old, Male Preventive care refers to lifestyle choices and visits with your health care provider that can promote health and wellness. This includes:  A yearly physical exam. This is also called an annual well check.  Regular dental and eye exams.  Immunizations.  Screening for certain conditions.  Healthy lifestyle choices, such as eating a healthy diet, getting regular exercise, not using drugs or products that contain nicotine and tobacco, and limiting alcohol use. What can I expect for my preventive care visit? Physical exam Your health care provider will check:  Height and weight. These may be used to calculate body mass index (BMI), which is a measurement that tells if you are at a healthy weight.  Heart rate and blood pressure.  Your skin for abnormal spots. Counseling Your health care provider may ask you questions about:  Alcohol, tobacco, and drug use.  Emotional well-being.  Home and relationship well-being.  Sexual activity.  Eating habits.  Work and work Statistician. What immunizations do I need?  Influenza (flu) vaccine  This is recommended every year. Tetanus, diphtheria, and pertussis (Tdap) vaccine  You may need a Td booster every 10 years. Varicella (chickenpox) vaccine  You may need this vaccine if you have not already been vaccinated. Zoster (shingles) vaccine  You may need this after age 64. Measles, mumps, and rubella (MMR) vaccine  You may need at least one dose of MMR if you were born in 1957 or later. You may also need a second dose. Pneumococcal conjugate (PCV13) vaccine  You may need this if you have certain conditions and were not previously vaccinated. Pneumococcal polysaccharide (PPSV23) vaccine  You may need one or two doses if you smoke cigarettes or if you have certain conditions. Meningococcal conjugate (MenACWY) vaccine  You may need this if you have certain conditions. Hepatitis A  vaccine  You may need this if you have certain conditions or if you travel or work in places where you may be exposed to hepatitis A. Hepatitis B vaccine  You may need this if you have certain conditions or if you travel or work in places where you may be exposed to hepatitis B. Haemophilus influenzae type b (Hib) vaccine  You may need this if you have certain risk factors. Human papillomavirus (HPV) vaccine  If recommended by your health care provider, you may need three doses over 6 months. You may receive vaccines as individual doses or as more than one vaccine together in one shot (combination vaccines). Talk with your health care provider about the risks and benefits of combination vaccines. What tests do I need? Blood tests  Lipid and cholesterol levels. These may be checked every 5 years, or more frequently if you are over 60 years old.  Hepatitis C test.  Hepatitis B test. Screening  Lung cancer screening. You may have this screening every year starting at age 43 if you have a 30-pack-year history of smoking and currently smoke or have quit within the past 15 years.  Prostate cancer screening. Recommendations will vary depending on your family history and other risks.  Colorectal cancer screening. All adults should have this screening starting at age 72 and continuing until age 2. Your health care provider may recommend screening at age 14 if you are at increased risk. You will have tests every 1-10 years, depending on your results and the type of screening test.  Diabetes screening. This is done by checking your blood sugar (glucose) after you have not eaten  for a while (fasting). You may have this done every 1-3 years.  Sexually transmitted disease (STD) testing. Follow these instructions at home: Eating and drinking  Eat a diet that includes fresh fruits and vegetables, whole grains, lean protein, and low-fat dairy products.  Take vitamin and mineral supplements as  recommended by your health care provider.  Do not drink alcohol if your health care provider tells you not to drink.  If you drink alcohol: ? Limit how much you have to 0-2 drinks a day. ? Be aware of how much alcohol is in your drink. In the [place] one drink equals one 12 oz bottle of beer (355 mL), one 5 oz glass of wine (148 mL), or one 1 oz glass of hard liquor (44 mL). Lifestyle  Take daily care of your teeth and gums.  Stay active. Exercise for at least 30 minutes on 5 or more days each week.  Do not use any products that contain nicotine or tobacco, such as cigarettes, e-cigarettes, and chewing tobacco. If you need help quitting, ask your health care provider.  If you are sexually active, practice safe sex. Use a condom or other form of protection to prevent STIs (sexually transmitted infections).  Talk with your health care provider about taking a low-dose aspirin every day starting at age 53. What's next?  Go to your health care provider once a year for a well check visit.  Ask your health care provider how often you should have your eyes and teeth checked.  Stay up to date on all vaccines. This information is not intended to replace advice given to you by your health care provider. Make sure you discuss any questions you have with your health care provider. Document Revised: 12/28/2017 Document Reviewed: 12/28/2017 Elsevier Patient Education  2020 [employer].

## 2019-09-24 LAB — HIV ANTIBODY (ROUTINE TESTING W REFLEX): HIV 1&2 Ab, 4th Generation: NONREACTIVE

## 2019-10-25 DIAGNOSIS — G4730 Sleep apnea, unspecified: Secondary | ICD-10-CM | POA: Diagnosis not present

## 2019-10-27 DIAGNOSIS — G4730 Sleep apnea, unspecified: Secondary | ICD-10-CM | POA: Diagnosis not present

## 2019-11-22 ENCOUNTER — Other Ambulatory Visit: Payer: Self-pay

## 2019-11-22 ENCOUNTER — Ambulatory Visit (INDEPENDENT_AMBULATORY_CARE_PROVIDER_SITE_OTHER): Payer: BC Managed Care – PPO

## 2019-11-22 DIAGNOSIS — Z23 Encounter for immunization: Secondary | ICD-10-CM | POA: Diagnosis not present

## 2020-01-17 DIAGNOSIS — Z20822 Contact with and (suspected) exposure to covid-19: Secondary | ICD-10-CM | POA: Diagnosis not present

## 2020-01-17 DIAGNOSIS — Z03818 Encounter for observation for suspected exposure to other biological agents ruled out: Secondary | ICD-10-CM | POA: Diagnosis not present

## 2020-01-17 DIAGNOSIS — U071 COVID-19: Secondary | ICD-10-CM | POA: Diagnosis not present

## 2020-03-18 NOTE — Progress Notes (Signed)
Name: Angel Chase   MRN: [id number]    DOB: 1965/03/22   Date:03/20/2020       Progress Note  Subjective  Chief Complaint  Follow up   HPI   Iron deficiency anemia due macroscopic hematuria: symptoms started over 20 years ago. He sees Insurance underwriter, hematologist and Nephrologist ( in the past ). Last labs was good, normal HCT. He still has hematuria daily No fever or chills or dysuria . Last CBC was normal July 2022 , he states last episode shortly after he saw me last time, he states after he took Crestor for one week he had another episodes, he stopped taking Crestor and symptoms resolved again   CT 01/2016 Status post aortic, pelvic, and left renal angiogram for investigation of left-sided hematuria with no arteriovenous malformation, venous shunting, or parenchymal lesion identified.  There is indistinct corticomedullary perfusion of the left kidney with questionable slow flow within distal vasculature. This finding could represent underlying medical renal condition such as chronic glomerulonephritis, or polyarteritis nodosa.  HTN: he is taking medication, denies side effects, bp is at goal today. No chest pain, dizziness  or palpitation   Insulin resistance/morbid obesity: he denies polyphagia, polydipsia or polyuria , he does not want to start medications. Last A1C was at goal, we will recheck labs  Hyperlipidemia: discussed statin medication again, he stopped Crestor because of hematuria, he thinks it causes it , we can try zetia if needed   The 10-year ASCVD risk score Denman George DC Montez Hageman., et al., 2013) is: 9.5%   Values used to calculate the score:     Age: 55 years     Sex: Male     Is Non-Hispanic African American: Yes     Diabetic: No     Tobacco smoker: No     Systolic Blood Pressure: 128 mmHg     Is BP treated: Yes     HDL Cholesterol: 47 mg/dL     Total Cholesterol: 183 mg/dL  Vitamin D deficiency: advised to continue vitamin D supplementation and we will recheck labs  today   Umbilical hernia: no problems at this time, he wears a band when lifting, discussed when to seek help  Patient Active Problem List   Diagnosis Date Noted  . Insulin resistance 07/26/2018  . Recurrent hematuria 03/29/2018  . Iron deficiency anemia due to chronic blood loss 03/29/2018  . Pure hypercholesterolemia 03/29/2018  . History of kidney stones 05/17/2017  . Abnormal ECG 05/30/2016  . History of iron deficiency anemia 04/28/2016  . History of diverticulitis 03/04/2016  . Hyperlipidemia 01/27/2015  . Hematuria, gross 07/10/2014  . Failure of erection 07/10/2014  . Hypertension, benign 07/10/2014  . Morbid obesity (HCC) 10/09/2013  . Vitamin D deficiency 10/09/2013    Past Surgical History:  Procedure Laterality Date  . ANKLE SURGERY Left 03/07/2011   fracture from trauma  . COLONOSCOPY WITH PROPOFOL N/A 07/01/2016   Procedure: COLONOSCOPY WITH PROPOFOL;  Surgeon: Wyline Mood, MD;  Location[facility] ENDOSCOPY;  Service: Endoscopy;  Laterality: N/A;  . CYSTOSCOPY W/ RETROGRADES Bilateral 06/01/2016   Procedure: CYSTOSCOPY WITH RETROGRADE PYELOGRAM;  Surgeon: Hildred Laser, MD;  Location: [facility] ORS;  Service: Urology;  Laterality: Bilateral;  . CYSTOSCOPY WITH STENT PLACEMENT Left 06/01/2016   Procedure: CYSTOSCOPY WITH STENT PLACEMENT;  Surgeon: Hildred Laser, MD;  Location: [facility] ORS;  Service: Urology;  Laterality: Left;  . ESOPHAGOGASTRODUODENOSCOPY (EGD) WITH PROPOFOL N/A 07/01/2016   Procedure: ESOPHAGOGASTRODUODENOSCOPY (EGD) WITH PROPOFOL;  Surgeon: Wyline Mood,  MD;  Location: [facility] ENDOSCOPY;  Service: Endoscopy;  Laterality: N/A;  . FLEXIBLE SIGMOIDOSCOPY N/A 02/24/2017   Procedure: FLEXIBLE SIGMOIDOSCOPY;  Surgeon: Wyline Mood, MD;  Location: [facility] ENDOSCOPY;  Service: Gastroenterology;  Laterality: N/A;  . IR RADIOLOGIST EVAL & MGMT  06/16/2016  . IR RENAL SELECTIVE  UNI INC S&I MOD SED  07/28/2016  . IR US GUIDE VASC ACCESS LEFT  07/28/2016  . STONE EXTRACTION  WITH BASKET Left 06/01/2016   Procedure: STONE EXTRACTION WITH BASKET;  Surgeon: Hildred Laser, MD;  Location: [facility] ORS;  Service: Urology;  Laterality: Left;  . TONSILLECTOMY    . TONSILLECTOMY AND ADENOIDECTOMY  age 11  . URETEROSCOPY Left 06/01/2016   Procedure: URETEROSCOPY;  Surgeon: Hildred Laser, MD;  Location: [facility] ORS;  Service: Urology;  Laterality: Left;    Family History  Problem Relation Age of Onset  . Breast cancer Mother   . Hypertension Mother   . Diabetes Father   . Hypertension Father   . Hypertension Sister   . Pancreatic disease Sister   . Diabetes Sister   . Hypertension Sister   . Hypertension Brother   . Diverticulosis Brother   . Prostate cancer Maternal Uncle   . Prostate cancer Maternal Uncle   . Dementia Maternal Grandmother   . Leukemia Maternal Grandfather   . Heart disease Paternal Grandmother   . Stomach cancer Paternal Grandfather   . Kidney cancer Neg Hx   . Bladder Cancer Neg Hx     Social History   Tobacco Use  . Smoking status: Never Smoker  . Smokeless tobacco: Never Used  Substance Use Topics  . Alcohol use: No    Alcohol/week: 0.0 standard drinks     Current Outpatient Medications:  .  Cholecalciferol (VITAMIN D) 50 MCG (2000 UT) CAPS, Take 1 capsule (2,000 Units total) by mouth daily., Disp: 30 capsule, Rfl: 11 .  ferrous sulfate 325 (65 FE) MG EC tablet, Take 325 mg by mouth daily with breakfast. (0600 & 1530), Disp: , Rfl:  .  Multiple Vitamin (MULTIVITAMIN WITH MINERALS) TABS tablet, Take 1 tablet by mouth daily at 6 (six) AM., Disp: , Rfl:  .  tadalafil (CIALIS) 20 MG tablet, Take 1 tablet (20 mg total) by mouth daily as needed for erectile dysfunction., Disp: 10 tablet, Rfl: 0 .  telmisartan-hydrochlorothiazide (MICARDIS HCT) 80-25 MG tablet, Take 1 tablet by mouth daily., Disp: 90 tablet, Rfl: 1 .  rosuvastatin (CRESTOR) 5 MG tablet, Take 1 tablet (5 mg total) by mouth daily. (Patient not taking: Reported on  03/20/2020), Disp: 90 tablet, Rfl: 1  No Known Allergies  I personally reviewed active problem list, medication list, allergies, family history, social history, health maintenance with the patient/caregiver today.   ROS  Constitutional: Negative for fever or weight change.  Respiratory: Negative for cough and shortness of breath.   Cardiovascular: Negative for chest pain or palpitations.  Gastrointestinal: Negative for abdominal pain, no bowel changes.  Musculoskeletal: Negative for gait problem or joint swelling.  Skin: Negative for rash.  Neurological: Negative for dizziness or headache.  No other specific complaints in a complete review of systems (except as listed in HPI above).  Objective  Vitals:   03/20/20 0902  BP: 128/76  Pulse: 93  Resp: 18  Temp: 98.4 F (36.9 C)  TempSrc: Oral  SpO2: 95%  Weight: (!) 316 lb 8 oz (143.6 kg)  Height: 5\' 11"  (1.803 m)    Body mass index is 44.14 kg/m.  Physical Exam  Constitutional: Patient appears well-developed and well-nourished. Obese  No distress.  HEENT: head atraumatic, normocephalic, pupils equal and reactive to light, neck supple Cardiovascular: Normal rate, regular rhythm and normal heart sounds.  No murmur heard. No BLE edema. Pulmonary/Chest: Effort normal and breath sounds normal. No respiratory distress. Abdominal: Soft.  There is no tenderness. Psychiatric: Patient has a normal mood and affect. behavior is normal. Judgment and thought content normal.  PHQ2/9: Depression screen [facility] 2/9 03/20/2020 09/20/2019 03/22/2019 08/03/2018 07/26/2018  Decreased Interest 0 0 0 0 0  Down, Depressed, Hopeless 0 0 0 0 0  PHQ - 2 Score 0 0 0 0 0  Altered sleeping - 0 0 0 0  Tired, decreased energy - 0 0 0 0  Change in appetite - 0 0 0 0  Feeling bad or failure about yourself  - 0 0 0 0  Trouble concentrating - 0 0 0 0  Moving slowly or fidgety/restless - 0 0 0 0  Suicidal thoughts - 0 0 0 0  PHQ-9 Score - 0 0 0 0  Difficult doing  work/chores - - Not difficult at all - -    phq 9 is negative   Fall Risk: Fall Risk  03/20/2020 09/20/2019 03/22/2019 08/03/2018 03/29/2018  Falls in the past year? 0 0 0 0 0  Number falls in past yr: 0 1 0 0 0  Injury with Fall? 0 0 0 0 0  Risk Factor Category  - - - - -  Risk for fall due to : - History of fall(s) - - -      Functional Status Survey: Is the patient deaf or have difficulty hearing?: No Does the patient have difficulty seeing, even when wearing glasses/contacts?: No Does the patient have difficulty concentrating, remembering, or making decisions?: No Does the patient have difficulty walking or climbing stairs?: No Does the patient have difficulty dressing or bathing?: No Does the patient have difficulty doing errands alone such as visiting a doctor's office or shopping?: No    Assessment & Plan  1. Essential hypertension  - COMPLETE METABOLIC PANEL WITH GFR - telmisartan-hydrochlorothiazide (MICARDIS HCT) 80-25 MG tablet; Take 1 tablet by mouth daily.  Dispense: 90 tablet; Refill: 1  2. Morbid obesity (HCC)  Discussed with the patient the risk posed by an increased BMI. Discussed importance of portion control, calorie counting and at least 150 minutes of physical activity weekly. Avoid sweet beverages and drink more water. Eat at least 6 servings of fruit and vegetables daily   3. Vitamin D deficiency  - VITAMIN D 25 Hydroxy (Vit-D Deficiency, Fractures)  4. Pure hypercholesterolemia  - Lipid panel  5. Insulin resistance  - Hemoglobin A1c  6. History of iron deficiency anemia  - CBC with Differential/Platelet  7. Recurrent hematuria

## 2020-03-20 ENCOUNTER — Ambulatory Visit: Payer: BC Managed Care – PPO | Admitting: Family Medicine

## 2020-03-20 ENCOUNTER — Other Ambulatory Visit: Payer: Self-pay

## 2020-03-20 ENCOUNTER — Encounter: Payer: Self-pay | Admitting: Family Medicine

## 2020-03-20 VITALS — BP 128/76 | HR 93 | Temp 98.4°F | Resp 18 | Ht 71.0 in | Wt 316.5 lb

## 2020-03-20 DIAGNOSIS — E7800 Pure hypercholesterolemia, unspecified: Secondary | ICD-10-CM

## 2020-03-20 DIAGNOSIS — I10 Essential (primary) hypertension: Secondary | ICD-10-CM | POA: Diagnosis not present

## 2020-03-20 DIAGNOSIS — N029 Recurrent and persistent hematuria with unspecified morphologic changes: Secondary | ICD-10-CM

## 2020-03-20 DIAGNOSIS — E88819 Insulin resistance, unspecified: Secondary | ICD-10-CM

## 2020-03-20 DIAGNOSIS — Z862 Personal history of diseases of the blood and blood-forming organs and certain disorders involving the immune mechanism: Secondary | ICD-10-CM

## 2020-03-20 DIAGNOSIS — E559 Vitamin D deficiency, unspecified: Secondary | ICD-10-CM | POA: Diagnosis not present

## 2020-03-20 DIAGNOSIS — E8881 Metabolic syndrome: Secondary | ICD-10-CM | POA: Diagnosis not present

## 2020-03-20 MED ORDER — TELMISARTAN-HCTZ 80-25 MG PO TABS: 1.0000 | ORAL_TABLET | Freq: Every day | ORAL | 1 refills | Status: DC

## 2020-03-21 LAB — CBC WITH DIFFERENTIAL/PLATELET
Absolute Monocytes: 812 cells/uL (ref 200–950)
Basophils Absolute: 73 cells/uL (ref 0–200)
Basophils Relative: 1.1 %
Eosinophils Absolute: 59 cells/uL (ref 15–500)
Eosinophils Relative: 0.9 %
HCT: 44.7 % (ref 38.5–50.0)
Hemoglobin: 15.2 g/dL (ref 13.2–17.1)
Lymphs Abs: 1412 cells/uL (ref 850–3900)
MCH: 29 pg (ref 27.0–33.0)
MCHC: 34 g/dL (ref 32.0–36.0)
MCV: 85.1 fL (ref 80.0–100.0)
MPV: 11 fL (ref 7.5–12.5)
Monocytes Relative: 12.3 %
Neutro Abs: 4244 cells/uL (ref 1500–7800)
Neutrophils Relative %: 64.3 %
Platelets: 198 10*3/uL (ref 140–400)
RBC: 5.25 10*6/uL (ref 4.20–5.80)
RDW: 13.1 % (ref 11.0–15.0)
Total Lymphocyte: 21.4 %
WBC: 6.6 10*3/uL (ref 3.8–10.8)

## 2020-03-21 LAB — LIPID PANEL
Cholesterol: 196 mg/dL (ref <200)
HDL: 45 mg/dL (ref >=40)
LDL Cholesterol (Calc): 132 mg/dL (calc) — ABNORMAL HIGH
Non-HDL Cholesterol (Calc): 151 mg/dL (calc) — ABNORMAL HIGH (ref <130)
Total CHOL/HDL Ratio: 4.4 (calc) (ref <5.0)
Triglycerides: 88 mg/dL (ref <150)

## 2020-03-21 LAB — COMPLETE METABOLIC PANEL WITH GFR
AG Ratio: 1.6 (calc) (ref 1.0–2.5)
ALT: 16 U/L (ref 9–46)
AST: 14 U/L (ref 10–35)
Albumin: 4.2 g/dL (ref 3.6–5.1)
Alkaline phosphatase (APISO): 79 U/L (ref 35–144)
BUN: 15 mg/dL (ref 7–25)
CO2: 26 mmol/L (ref 20–32)
Calcium: 9.7 mg/dL (ref 8.6–10.3)
Chloride: 105 mmol/L (ref 98–110)
Creat: 0.97 mg/dL (ref 0.70–1.33)
GFR, Est African American: 104 mL/min/{1.73_m2} (ref >=60)
GFR, Est Non African American: 89 mL/min/{1.73_m2} (ref >=60)
Globulin: 2.6 g/dL (calc) (ref 1.9–3.7)
Glucose, Bld: 90 mg/dL (ref 65–99)
Potassium: 3.9 mmol/L (ref 3.5–5.3)
Sodium: 142 mmol/L (ref 135–146)
Total Bilirubin: 0.6 mg/dL (ref 0.2–1.2)
Total Protein: 6.8 g/dL (ref 6.1–8.1)

## 2020-03-21 LAB — HEMOGLOBIN A1C
Hgb A1c MFr Bld: 5.6 % of total Hgb (ref <5.7)
Mean Plasma Glucose: 114 mg/dL
eAG (mmol/L): 6.3 mmol/L

## 2020-03-21 LAB — VITAMIN D 25 HYDROXY (VIT D DEFICIENCY, FRACTURES): Vit D, 25-Hydroxy: 43 ng/mL (ref 30–100)

## 2020-05-07 ENCOUNTER — Encounter: Payer: Self-pay | Admitting: Urology

## 2020-05-07 ENCOUNTER — Ambulatory Visit: Payer: BC Managed Care – PPO | Admitting: Urology

## 2020-05-07 ENCOUNTER — Other Ambulatory Visit: Payer: Self-pay

## 2020-05-07 VITALS — BP 146/83 | HR 82 | Ht 71.0 in | Wt 315.0 lb

## 2020-05-07 DIAGNOSIS — R310 Gross hematuria: Secondary | ICD-10-CM | POA: Diagnosis not present

## 2020-05-07 DIAGNOSIS — N529 Male erectile dysfunction, unspecified: Secondary | ICD-10-CM | POA: Diagnosis not present

## 2020-05-07 NOTE — Progress Notes (Signed)
05/07/2020 10:07 AM   Angel Chase 11/04/1965 [id number]  Reason for visit: Follow up chronic gross hematuria, ED  HPI: Mr. Kempa is a 54-year-old African-American male with morbid obesity and hypertension that was previously followed by Dr. Budzyn for recurrent gross hematuria. He has a complex urologic history. To briefly summarize, he reports 30 years of persistent pink to red urine with small clots. This has required iron supplementation however no blood transfusions. He is completely asymptomatic and he denies any flank pain, urinary symptoms, or retention from clots. He underwent extensive work-up previously including a negative renal biopsy in 2000 at [facility], cystoscopy(noted to have localizing left sided hematuria from left ureteral orifice), retrograde pyelograms, left diagnostic ureteroscopy in 2018 with Dr. Budzyn without any abnormal findings,and3mm renalstone removal,as well as left sided angiography with interventional radiology in July 2018 which was essentially negative. There was a possibility of a chronic nephritis or vasculitis, but no AV malformation or active extravasation.  Renal function is normal with creatinine 0.97, EGFR greater than 60.    He overall is doing well.  He was recently started on Cialis as needed by his PCP with good results.  He actually has had significant improvement in his gross hematuria since getting the COVID-vaccine.  He had 1 episode of pink urine when starting a statin which then resolved after discontinuing the medication.  He really has not had any clots or major urinary problems over the last year.  Blood counts are stable.  Reassurance provided, RTC 1 year symptom check, consider following up as needed if continues to do well at that point   Brian C Sninsky, MD  [facility] [address], [address] , [place] [address] [phone]    

## 2020-06-17 DIAGNOSIS — H35033 Hypertensive retinopathy, bilateral: Secondary | ICD-10-CM | POA: Diagnosis not present

## 2020-09-25 ENCOUNTER — Other Ambulatory Visit: Payer: Self-pay

## 2020-09-25 ENCOUNTER — Ambulatory Visit (INDEPENDENT_AMBULATORY_CARE_PROVIDER_SITE_OTHER): Payer: BC Managed Care – PPO | Admitting: Family Medicine

## 2020-09-25 ENCOUNTER — Encounter: Payer: Self-pay | Admitting: Family Medicine

## 2020-09-25 DIAGNOSIS — I10 Essential (primary) hypertension: Secondary | ICD-10-CM

## 2020-09-25 DIAGNOSIS — E559 Vitamin D deficiency, unspecified: Secondary | ICD-10-CM

## 2020-09-25 DIAGNOSIS — Z0000 Encounter for general adult medical examination without abnormal findings: Secondary | ICD-10-CM | POA: Diagnosis not present

## 2020-09-25 DIAGNOSIS — Z23 Encounter for immunization: Secondary | ICD-10-CM

## 2020-09-25 DIAGNOSIS — E8881 Metabolic syndrome: Secondary | ICD-10-CM

## 2020-09-25 DIAGNOSIS — E7800 Pure hypercholesterolemia, unspecified: Secondary | ICD-10-CM | POA: Diagnosis not present

## 2020-09-25 DIAGNOSIS — Z125 Encounter for screening for malignant neoplasm of prostate: Secondary | ICD-10-CM | POA: Diagnosis not present

## 2020-09-25 DIAGNOSIS — Z862 Personal history of diseases of the blood and blood-forming organs and certain disorders involving the immune mechanism: Secondary | ICD-10-CM | POA: Diagnosis not present

## 2020-09-25 DIAGNOSIS — K429 Umbilical hernia without obstruction or gangrene: Secondary | ICD-10-CM

## 2020-09-25 MED ORDER — TELMISARTAN-HCTZ 80-25 MG PO TABS: 1.0000 | ORAL_TABLET | Freq: Every day | ORAL | 1 refills | Status: DC

## 2020-09-25 NOTE — Patient Instructions (Signed)
Preventive Care 40-53 Years Old, Male Preventive care refers to lifestyle choices and visits with your health care provider that can promote health and wellness. This includes: A yearly physical exam. This is also called an annual wellness visit. Regular dental and eye exams. Immunizations. Screening for certain conditions. Healthy lifestyle choices, such as: Eating a healthy diet. Getting regular exercise. Not using drugs or products that contain nicotine and tobacco. Limiting alcohol use. What can I expect for my preventive care visit? Physical exam Your health care provider will check your: Height and weight. These may be used to calculate your BMI (body mass index). BMI is a measurement that tells if you are at a healthy weight. Heart rate and blood pressure. Body temperature. Skin for abnormal spots. Counseling Your health care provider may ask you questions about your: Past medical problems. Family's medical history. Alcohol, tobacco, and drug use. Emotional well-being. Home life and relationship well-being. Sexual activity. Diet, exercise, and sleep habits. Work and work environment. Access to firearms. What immunizations do I need? Vaccines are usually given at various ages, according to a schedule. Your health care provider will recommend vaccines for you based on your age, medical history, and lifestyle or other factors, such as travel or where you work. What tests do I need? Blood tests Lipid and cholesterol levels. These may be checked every 5 years, or more often if you are over 50 years old. Hepatitis C test. Hepatitis B test. Screening Lung cancer screening. You may have this screening every year starting at age 55 if you have a 30-pack-year history of smoking and currently smoke or have quit within the past 15 years. Prostate cancer screening. Recommendations will vary depending on your family history and other risks. Genital exam to check for testicular cancer  or hernias. Colorectal cancer screening. All adults should have this screening starting at age 50 and continuing until age 75. Your health care provider may recommend screening at age 45 if you are at increased risk. You will have tests every 1-10 years, depending on your results and the type of screening test. Diabetes screening. This is done by checking your blood sugar (glucose) after you have not eaten for a while (fasting). You may have this done every 1-3 years. STD (sexually transmitted disease) testing, if you are at risk. Follow these instructions at home: Eating and drinking  Eat a diet that includes fresh fruits and vegetables, whole grains, lean protein, and low-fat dairy products. Take vitamin and mineral supplements as recommended by your health care provider. Do not drink alcohol if your health care provider tells you not to drink. If you drink alcohol: Limit how much you have to 0-2 drinks a day. Be aware of how much alcohol is in your drink. In the [place] one drink equals one 12 oz bottle of beer (355 mL), one 5 oz glass of wine (148 mL), or one 1 oz glass of hard liquor (44 mL). Lifestyle Take daily care of your teeth and gums. Brush your teeth every morning and night with fluoride toothpaste. Floss one time each day. Stay active. Exercise for at least 30 minutes 5 or more days each week. Do not use any products that contain nicotine or tobacco, such as cigarettes, e-cigarettes, and chewing tobacco. If you need help quitting, ask your health care provider. Do not use drugs. If you are sexually active, practice safe sex. Use a condom or other form of protection to prevent STIs (sexually transmitted infections). If told by your   health care provider, take low-dose aspirin daily starting at age 50. Find healthy ways to cope with stress, such as: Meditation, yoga, or listening to music. Journaling. Talking to a trusted person. Spending time with friends and  family. Safety Always wear your seat belt while driving or riding in a vehicle. Do not drive: If you have been drinking alcohol. Do not ride with someone who has been drinking. When you are tired or distracted. While texting. Wear a helmet and other protective equipment during sports activities. If you have firearms in your house, make sure you follow all gun safety procedures. What's next? Go to your health care provider once a year for an annual wellness visit. Ask your health care provider how often you should have your eyes and teeth checked. Stay up to date on all vaccines. This information is not intended to replace advice given to you by your health care provider. Make sure you discuss any questions you have with your health care provider. Document Revised: 03/13/2020 Document Reviewed: 12/28/2017 Elsevier Patient Education  2022 [employer]   

## 2020-09-25 NOTE — Progress Notes (Signed)
Name: Angel Chase   MRN: [id number]    DOB: Jun 22, 1965   Date:09/25/2020       Progress Note  Subjective  Chief Complaint  Annual Exam  HPI  Patient presents for annual CPE and follow up  Iron deficiency anemia due macroscopic hematuria: symptoms started over 20 years ago. He sees Insurance underwriterUrologist, hematologist and Nephrologist ( not recently)  Last labs was good, normal HCT. He still has hematuria daily No fever or chills or dysuria . Last CBC was normal. We will recheck levels today    CT 01/2016 Status post aortic, pelvic, and left renal angiogram for investigation of left-sided hematuria with no arteriovenous malformation, venous shunting, or parenchymal lesion identified.   There is indistinct corticomedullary perfusion of the left kidney with questionable slow flow within distal vasculature. This finding could represent underlying medical renal condition such as chronic glomerulonephritis, or polyarteritis nodosa.   HTN: he is taking medication, denies side effects, bp is at goal today. No chest pain, dizziness or palpitation . Continue medication    Insulin resistance/morbid obesity: he denies polyphagia, polydipsia or polyuria , he does not want to start medications. Last A1C was at goal, he is willing to have labs done today    Hyperlipidemia:he is back on Crestor 5 mg, daily for the past two months , we will recheck labs and if not at goal we will adjust the dose or add Zetia    .The 10-year ASCVD risk score (Arnett DK, et al., 2019) is: 11.2%   Values used to calculate the score:     Age: [age] years     Sex: Male     Is Non-Hispanic African American: Yes     Diabetic: No     Tobacco smoker: No     Systolic Blood Pressure: 134 mmHg     Is BP treated: Yes     HDL Cholesterol: 45 mg/dL     Total Cholesterol: 196 mg/dL    Vitamin D deficiency: he has not been taking supplementation but will resume it now that Fall is approaching  Umbilical hernia: no problems at this time,  however he is very concerned since his cousin died recently from complications of incarcerated hernia     IPSS Questionnaire (AUA-7): Over the past month.   1)  How often have you had a sensation of not emptying your bladder completely after you finish urinating?  0 - Not at all  2)  How often have you had to urinate again less than two hours after you finished urinating? 0 - Not at all  3)  How often have you found you stopped and started again several times when you urinated?  0 - Not at all  4) How difficult have you found it to postpone urination?  0 - Not at all  5) How often have you had a weak urinary stream?  0 - Not at all  6) How often have you had to push or strain to begin urination?  0 - Not at all  7) How many times did you most typically get up to urinate from the time you went to bed until the time you got up in the morning?  1 - 1 time  Total score:  0-7 mildly symptomatic   8-19 moderately symptomatic   20-35 severely symptomatic     Diet: not very balanced lately, eats fast food  Exercise: discussed regular activity   Depression: phq 9 is negative [facility] screen [facility]  09/25/2020 03/20/2020 09/20/2019 03/22/2019 08/03/2018  Decreased Interest 0 0 0 0 0  Down, Depressed, Hopeless 0 0 0 0 0  PHQ - 2 Score 0 0 0 0 0  Altered sleeping - - 0 0 0  Tired, decreased energy - - 0 0 0  Change in appetite - - 0 0 0  Feeling bad or failure about yourself  - - 0 0 0  Trouble concentrating - - 0 0 0  Moving slowly or fidgety/restless - - 0 0 0  Suicidal thoughts - - 0 0 0  PHQ-9 Score - - 0 0 0  Difficult doing work/chores - - - Not difficult at all -    Hypertension:  BP Readings from Last 3 Encounters:  09/25/20 134/78  05/07/20 (!) 146/83  03/20/20 128/76    Obesity: Wt Readings from Last 3 Encounters:  09/25/20 (!) 323 lb (146.5 kg)  05/07/20 (!) 315 lb (142.9 kg)  03/20/20 (!) 316 lb 8 oz (143.6 kg)   BMI Readings from Last 3 Encounters:  09/25/20 45.05 kg/m   05/07/20 43.93 kg/m  03/20/20 44.14 kg/m     Lipids:  Lab Results  Component Value Date   CHOL 196 03/20/2020   CHOL 183 03/22/2019   CHOL 180 03/29/2018   Lab Results  Component Value Date   HDL 45 03/20/2020   HDL 47 03/22/2019   HDL 48 03/29/2018   Lab Results  Component Value Date   LDLCALC 132 (H) 03/20/2020   LDLCALC 116 (H) 03/22/2019   LDLCALC 116 (H) 03/29/2018   Lab Results  Component Value Date   TRIG 88 03/20/2020   TRIG 102 03/22/2019   TRIG 69 03/29/2018   Lab Results  Component Value Date   CHOLHDL 4.4 03/20/2020   CHOLHDL 3.9 03/22/2019   CHOLHDL 3.8 03/29/2018   No results found for: LDLDIRECT Glucose:  Glucose  Date Value Ref Range Status  03/04/2011 88 65 - 99 mg/dL Final   Glucose, Bld  Date Value Ref Range Status  03/20/2020 90 65 - 99 mg/dL Final    Comment:    .            Fasting reference interval .   03/22/2019 94 65 - 99 mg/dL Final    Comment:    .            Fasting reference interval .   03/29/2018 87 65 - 99 mg/dL Final    Comment:    .            Fasting reference interval .     Flowsheet Row Office Visit from 09/20/2019 in [facility]  AUDIT-C Score 0       Married STD testing and prevention (HIV/chl/gon/syphilis): 09/20/19 Hep C: 07/26/18  Skin cancer: Discussed monitoring for atypical lesions Colorectal cancer: 07/01/16 Prostate cancer:  Lab Results  Component Value Date   PSA 0.5 07/26/2018   PSA 0.5 05/17/2017   PSA 0.7 03/28/2016     Lung cancer: Low Dose CT Chest recommended if Age 21-80 years, 30 pack-year currently smoking OR have quit w/in 15years. Patient does not qualify.   AAA: The USPSTF recommends one-time screening with ultrasonography in men ages 3 to 48 years who have ever smoked ECG:  05/24/16  Vaccines:   Shingrix: up to date  Pneumonia: educated and discussed with patient. Flu: educated and discussed with patient.  Advanced Care Planning: A voluntary  discussion about advance care planning including the  explanation and discussion of advance directives.  Discussed health care proxy and Living will, and the patient was able to identify a health care proxy as wife.    Patient Active Problem List   Diagnosis Date Noted   Insulin resistance 07/26/2018   Recurrent hematuria 03/29/2018   Iron deficiency anemia due to chronic blood loss 03/29/2018   Pure hypercholesterolemia 03/29/2018   History of kidney stones 05/17/2017   Abnormal ECG 05/30/2016   History of iron deficiency anemia 04/28/2016   History of diverticulitis 03/04/2016   Hyperlipidemia 01/27/2015   Hematuria, gross 07/10/2014   Failure of erection 07/10/2014   Hypertension, benign 07/10/2014   Morbid obesity (HCC) 10/09/2013   Vitamin D deficiency 10/09/2013    Past Surgical History:  Procedure Laterality Date   ANKLE SURGERY Left 03/07/2011   fracture from trauma   COLONOSCOPY WITH PROPOFOL N/A 07/01/2016   Procedure: COLONOSCOPY WITH PROPOFOL;  Surgeon: Wyline Mood, MD;  Location: [facility] ENDOSCOPY;  Service: Endoscopy;  Laterality: N/A;   CYSTOSCOPY W/ RETROGRADES Bilateral 06/01/2016   Procedure: CYSTOSCOPY WITH RETROGRADE PYELOGRAM;  Surgeon: Hildred Laser, MD;  Location: [facility] ORS;  Service: Urology;  Laterality: Bilateral;   CYSTOSCOPY WITH STENT PLACEMENT Left 06/01/2016   Procedure: CYSTOSCOPY WITH STENT PLACEMENT;  Surgeon: Hildred Laser, MD;  Location: [facility] ORS;  Service: Urology;  Laterality: Left;   ESOPHAGOGASTRODUODENOSCOPY (EGD) WITH PROPOFOL N/A 07/01/2016   Procedure: ESOPHAGOGASTRODUODENOSCOPY (EGD) WITH PROPOFOL;  Surgeon: Wyline Mood, MD;  Location: [facility] ENDOSCOPY;  Service: Endoscopy;  Laterality: N/A;   FLEXIBLE SIGMOIDOSCOPY N/A 02/24/2017   Procedure: FLEXIBLE SIGMOIDOSCOPY;  Surgeon: Wyline Mood, MD;  Location[facility] ENDOSCOPY;  Service: Gastroenterology;  Laterality: N/A;   IR RADIOLOGIST EVAL & MGMT  06/16/2016   IR RENAL SELECTIVE  UNI INC S&I  MOD SED  07/28/2016   IR US GUIDE VASC ACCESS LEFT  07/28/2016   STONE EXTRACTION WITH BASKET Left 06/01/2016   Procedure: STONE EXTRACTION WITH BASKET;  Surgeon: Hildred Laser, MD;  Location: [facility] ORS;  Service: Urology;  Laterality: Left;   TONSILLECTOMY     TONSILLECTOMY AND ADENOIDECTOMY  age 33   URETEROSCOPY Left 06/01/2016   Procedure: URETEROSCOPY;  Surgeon: Hildred Laser, MD;  Location: [facility] ORS;  Service: Urology;  Laterality: Left;    Family History  Problem Relation Age of Onset   Breast cancer Mother    Hypertension Mother    Diabetes Father    Hypertension Father    Hypertension Sister    Pancreatic disease Sister    Diabetes Sister    Hypertension Sister    Hypertension Brother    Diverticulosis Brother    Prostate cancer Maternal Uncle    Prostate cancer Maternal Uncle    Dementia Maternal Grandmother    Leukemia Maternal Grandfather    Heart disease Paternal Grandmother    Stomach cancer Paternal Grandfather    Kidney cancer Neg Hx    Bladder Cancer Neg Hx     Social History   Socioeconomic History   Marital status: Married    Spouse name: Sherrie   Number of children: 1   Years of education: Not on file   Highest education level: 12th grade  Occupational History   Occupation: Education officer, community  Tobacco Use   Smoking status: Never   Smokeless tobacco: Never  Vaping Use   Vaping Use: Never used  Substance and Sexual Activity   Alcohol use: No    Alcohol/week: 0.0 standard drinks   Drug  use: No   Sexual activity: Yes    Partners: Female    Comment: wife hysterectomy   Other Topics Concern   Not on file  Social History Narrative   Not on file   Social Determinants of Health   Financial Resource Strain: Low Risk    Difficulty of Paying Living Expenses: Not hard at all  Food Insecurity: No Food Insecurity   Worried About Programme researcher, broadcasting/film/video in the Last Year: Never true   Ran Out of Food in the Last Year: Never true   Transportation Needs: No Transportation Needs   Lack of Transportation (Medical): No   Lack of Transportation (Non-Medical): No  Physical Activity: Insufficiently Active   Days of Exercise per Week: 2 days   Minutes of Exercise per Session: 10 min  Stress: No Stress Concern Present   Feeling of Stress : Not at all  Social Connections: Moderately Integrated   Frequency of Communication with Friends and Family: More than three times a week   Frequency of Social Gatherings with Friends and Family: Three times a week   Attends Religious Services: More than 4 times per year   Active Member of Clubs or Organizations: No   Attends Banker Meetings: Never   Marital Status: Married  Catering manager Violence: Not At Risk   Fear of Current or Ex-Partner: No   Emotionally Abused: No   Physically Abused: No   Sexually Abused: No     Current Outpatient Medications:    Cholecalciferol (VITAMIN D) 50 MCG (2000 UT) CAPS, Take 1 capsule (2,000 Units total) by mouth daily., Disp: 30 capsule, Rfl: 11   ferrous sulfate 325 (65 FE) MG EC tablet, Take 325 mg by mouth daily with breakfast. (0600 & 1530), Disp: , Rfl:    Multiple Vitamin (MULTIVITAMIN WITH MINERALS) TABS tablet, Take 1 tablet by mouth daily at 6 (six) AM., Disp: , Rfl:    tadalafil (CIALIS) 20 MG tablet, Take 1 tablet (20 mg total) by mouth daily as needed for erectile dysfunction., Disp: 10 tablet, Rfl: 0   telmisartan-hydrochlorothiazide (MICARDIS HCT) 80-25 MG tablet, Take 1 tablet by mouth daily., Disp: 90 tablet, Rfl: 1  No Known Allergies   ROS  Constitutional: Negative for fever , positive weight change.  Respiratory: Negative for cough and shortness of breath.   Cardiovascular: Negative for chest pain or palpitations.  Gastrointestinal: Negative for abdominal pain, no bowel changes.  Musculoskeletal: Negative for gait problem or joint swelling.  Skin: Negative for rash.  Neurological: Negative for dizziness or  headache.  No other specific complaints in a complete review of systems (except as listed in HPI above).    Objective  Vitals:   09/25/20 0912  BP: 134/78  Pulse: 85  Resp: 16  Temp: 98 F (36.7 C)  SpO2: 98%  Weight: (!) 323 lb (146.5 kg)  Height: 5\' 11"  (1.803 m)    Body mass index is 45.05 kg/m.  Physical Exam  Constitutional: Patient appears well-developed and well-nourished. No distress.  HENT: Head: Normocephalic and atraumatic. Ears: B TMs ok, no erythema or effusion; Nose: Nose normal. Mouth/Throat: Oropharynx is clear and moist. No oropharyngeal exudate.  Eyes: Conjunctivae and EOM are normal. Pupils are equal, round, and reactive to light. No scleral icterus.  Neck: Normal range of motion. Neck supple. No JVD present. No thyromegaly present.  Cardiovascular: Normal rate, regular rhythm and normal heart sounds.  No murmur heard. No BLE edema. Pulmonary/Chest: Effort normal and breath sounds  normal. No respiratory distress. Abdominal: Soft. Bowel sounds are normal, no distension. There is no tenderness. Reducible umbilical hernia  MALE GENITALIA: Normal descended testes bilaterally, no masses palpated, no hernias, no lesions, no discharge RECTAL: not done  Musculoskeletal: Normal range of motion, no joint effusions. No gross deformities Neurological: he is alert and oriented to person, place, and time. No cranial nerve deficit. Coordination, balance, strength, speech and gait are normal.  Skin: Skin is warm and dry. No rash noted. No erythema.  Psychiatric: Patient has a normal mood and affect. behavior is normal. Judgment and thought content normal.    Fall Risk: Fall Risk  09/25/2020 03/20/2020 09/20/2019 03/22/2019 08/03/2018  Falls in the past year? 1 0 0 0 0  Number falls in past yr: 1 0 1 0 0  Injury with Fall? 0 0 0 0 0  Risk Factor Category  - - - - -  Risk for fall due to : No Fall Risks - History of fall(s) - -  Follow up Falls prevention discussed - - - -       Functional Status Survey: Is the patient deaf or have difficulty hearing?: No Does the patient have difficulty seeing, even when wearing glasses/contacts?: No Does the patient have difficulty concentrating, remembering, or making decisions?: No Does the patient have difficulty walking or climbing stairs?: No Does the patient have difficulty dressing or bathing?: No Does the patient have difficulty doing errands alone such as visiting a doctor's office or shopping?: No    Assessment & Plan  1. Well adult exam   2. Essential hypertension  - telmisartan-hydrochlorothiazide (MICARDIS HCT) 80-25 MG tablet; Take 1 tablet by mouth daily.  Dispense: 90 tablet; Refill: 1 - COMPLETE METABOLIC PANEL WITH GFR - CBC with Differential/Platelet  3. Morbid obesity (HCC)  Constitutional: Negative for fever or weight change.  Respiratory: Negative for cough and shortness of breath.   Cardiovascular: Negative for chest pain or palpitations.  Gastrointestinal: Negative for abdominal pain, no bowel changes.  Musculoskeletal: Negative for gait problem or joint swelling.  Skin: Negative for rash.  Neurological: Negative for dizziness or headache.  No other specific complaints in a complete review of systems (except as listed in HPI above).   4. Vitamin D deficiency  Resume vitamin D   5. History of iron deficiency anemia  - Iron, TIBC and Ferritin Panel - CBC with Differential/Platelet  6. Insulin resistance  - Hemoglobin A1c  7. Pure hypercholesterolemia  - Lipid panel  8. Prostate cancer screening  - PSA  9. Needs flu shot  - Flu Vaccine QUAD 6+ mos PF IM (Fluarix Quad PF)  10. Umbilical hernia without obstruction or gangrene  - Ambulatory referral to General Surgery     -Prostate cancer screening and PSA options (with potential risks and benefits of testing vs not testing) were discussed along with recent recs/guidelines. -USPSTF grade A and B recommendations  reviewed with patient; age-appropriate recommendations, preventive care, screening tests, etc discussed and encouraged; healthy living encouraged; see AVS for patient education given to patient -Discussed importance of 150 minutes of physical activity weekly, eat two servings of fish weekly, eat one serving of tree nuts ( cashews, pistachios, pecans, almonds.Marland Kitchen) every other day, eat 6 servings of fruit/vegetables daily and drink plenty of water and avoid sweet beverages.

## 2020-09-26 LAB — CBC WITH DIFFERENTIAL/PLATELET
Absolute Monocytes: 784 cells/uL (ref 200–950)
Basophils Absolute: 52 cells/uL (ref 0–200)
Basophils Relative: 0.7 %
Eosinophils Absolute: 52 cells/uL (ref 15–500)
Eosinophils Relative: 0.7 %
HCT: 45.7 % (ref 38.5–50.0)
Hemoglobin: 15.2 g/dL (ref 13.2–17.1)
Lymphs Abs: 1362 cells/uL (ref 850–3900)
MCH: 29 pg (ref 27.0–33.0)
MCHC: 33.3 g/dL (ref 32.0–36.0)
MCV: 87.2 fL (ref 80.0–100.0)
MPV: 10.9 fL (ref 7.5–12.5)
Monocytes Relative: 10.6 %
Neutro Abs: 5150 cells/uL (ref 1500–7800)
Neutrophils Relative %: 69.6 %
Platelets: 199 10*3/uL (ref 140–400)
RBC: 5.24 10*6/uL (ref 4.20–5.80)
RDW: 13.1 % (ref 11.0–15.0)
Total Lymphocyte: 18.4 %
WBC: 7.4 10*3/uL (ref 3.8–10.8)

## 2020-09-26 LAB — COMPLETE METABOLIC PANEL WITH GFR
AG Ratio: 1.9 (calc) (ref 1.0–2.5)
ALT: 23 U/L (ref 9–46)
AST: 17 U/L (ref 10–35)
Albumin: 4.5 g/dL (ref 3.6–5.1)
Alkaline phosphatase (APISO): 72 U/L (ref 35–144)
BUN: 12 mg/dL (ref 7–25)
CO2: 28 mmol/L (ref 20–32)
Calcium: 9.7 mg/dL (ref 8.6–10.3)
Chloride: 101 mmol/L (ref 98–110)
Creat: 0.9 mg/dL (ref 0.70–1.30)
Globulin: 2.4 g/dL (calc) (ref 1.9–3.7)
Glucose, Bld: 85 mg/dL (ref 65–99)
Potassium: 3.8 mmol/L (ref 3.5–5.3)
Sodium: 138 mmol/L (ref 135–146)
Total Bilirubin: 0.7 mg/dL (ref 0.2–1.2)
Total Protein: 6.9 g/dL (ref 6.1–8.1)
eGFR: 102 mL/min/{1.73_m2} (ref >=60)

## 2020-09-26 LAB — IRON,TIBC AND FERRITIN PANEL
%SAT: 32 % (calc) (ref 20–48)
Ferritin: 291 ng/mL (ref 38–380)
Iron: 89 ug/dL (ref 50–180)
TIBC: 277 mcg/dL (calc) (ref 250–425)

## 2020-09-26 LAB — HEMOGLOBIN A1C
Hgb A1c MFr Bld: 5.6 % of total Hgb (ref <5.7)
Mean Plasma Glucose: 114 mg/dL
eAG (mmol/L): 6.3 mmol/L

## 2020-09-26 LAB — LIPID PANEL
Cholesterol: 144 mg/dL (ref <200)
HDL: 48 mg/dL (ref >=40)
LDL Cholesterol (Calc): 78 mg/dL (calc)
Non-HDL Cholesterol (Calc): 96 mg/dL (calc) (ref <130)
Total CHOL/HDL Ratio: 3 (calc) (ref <5.0)
Triglycerides: 93 mg/dL (ref <150)

## 2020-09-26 LAB — PSA: PSA: 0.55 ng/mL (ref <=4.00)

## 2020-10-01 ENCOUNTER — Ambulatory Visit: Payer: BC Managed Care – PPO | Admitting: Surgery

## 2020-10-01 ENCOUNTER — Encounter: Payer: Self-pay | Admitting: Surgery

## 2020-10-01 ENCOUNTER — Other Ambulatory Visit: Payer: Self-pay

## 2020-10-01 VITALS — BP 134/91 | HR 81 | Temp 98.3°F | Ht 71.0 in | Wt 327.4 lb

## 2020-10-01 DIAGNOSIS — K429 Umbilical hernia without obstruction or gangrene: Secondary | ICD-10-CM | POA: Insufficient documentation

## 2020-10-01 NOTE — Progress Notes (Signed)
Patient ID: Angel Chase, male   DOB: May 30, 1965, 55 y.o.   MRN: [id number]  Chief Complaint: Umbilical hernia  History of Present Illness Angel CRUZAN is a 55 y.o. male with an infraumbilical nodule, that had become slightly tender with aggressive activities.  Otherwise it does not bother him.  He has no pain.  Denies any bowel changes.  Denies any nausea, vomiting fevers or chills.  Recently had a cousin died at the age of 32 due to complications from hernia, does not want to allow this to progress to that point.  He denies any pain or exacerbation with coughing or sneezing.  He appears to be quite active with minimal disturbance from the hernia itself.  Past Medical History Past Medical History:  Diagnosis Date   Anemia    ED (erectile dysfunction)    EKG abnormalities    Hematuria    Hematuria    x 20 yr per pt   History of kidney stones    Hypertension    Obesity    Obstructive sleep apnea    Vitamin D deficiency       Past Surgical History:  Procedure Laterality Date   ANKLE SURGERY Left 03/07/2011   fracture from trauma   COLONOSCOPY WITH PROPOFOL N/A 07/01/2016   Procedure: COLONOSCOPY WITH PROPOFOL;  Surgeon: Wyline Mood, MD;  Location: [facility] ENDOSCOPY;  Service: Endoscopy;  Laterality: N/A;   CYSTOSCOPY W/ RETROGRADES Bilateral 06/01/2016   Procedure: CYSTOSCOPY WITH RETROGRADE PYELOGRAM;  Surgeon: Hildred Laser, MD;  Location: [facility] ORS;  Service: Urology;  Laterality: Bilateral;   CYSTOSCOPY WITH STENT PLACEMENT Left 06/01/2016   Procedure: CYSTOSCOPY WITH STENT PLACEMENT;  Surgeon: Hildred Laser, MD;  Location: [facility] ORS;  Service: Urology;  Laterality: Left;   ESOPHAGOGASTRODUODENOSCOPY (EGD) WITH PROPOFOL N/A 07/01/2016   Procedure: ESOPHAGOGASTRODUODENOSCOPY (EGD) WITH PROPOFOL;  Surgeon: Wyline Mood, MD;  Location[facility] ENDOSCOPY;  Service: Endoscopy;  Laterality: N/A;   FLEXIBLE SIGMOIDOSCOPY N/A 02/24/2017   Procedure: FLEXIBLE SIGMOIDOSCOPY;  Surgeon: Wyline Mood, MD;  Location: [facility] ENDOSCOPY;  Service: Gastroenterology;  Laterality: N/A;   IR RADIOLOGIST EVAL & MGMT  06/16/2016   IR RENAL SELECTIVE  UNI INC S&I MOD SED  07/28/2016   IR US GUIDE VASC ACCESS LEFT  07/28/2016   STONE EXTRACTION WITH BASKET Left 06/01/2016   Procedure: STONE EXTRACTION WITH BASKET;  Surgeon: Hildred Laser, MD;  Location: [facility] ORS;  Service: Urology;  Laterality: Left;   TONSILLECTOMY     TONSILLECTOMY AND ADENOIDECTOMY  age 43   URETEROSCOPY Left 06/01/2016   Procedure: URETEROSCOPY;  Surgeon: Hildred Laser, MD;  Location: [facility] ORS;  Service: Urology;  Laterality: Left;    No Known Allergies  Current Outpatient Medications  Medication Sig Dispense Refill   Cholecalciferol (VITAMIN D) 50 MCG (2000 UT) CAPS Take 1 capsule (2,000 Units total) by mouth daily. 30 capsule 11   ferrous sulfate 325 (65 FE) MG EC tablet Take 325 mg by mouth daily with breakfast. (0600 & 1530)     Multiple Vitamin (MULTIVITAMIN WITH MINERALS) TABS tablet Take 1 tablet by mouth daily at 6 (six) AM.     rosuvastatin (CRESTOR) 5 MG tablet Take 5 mg by mouth daily.     tadalafil (CIALIS) 20 MG tablet Take 1 tablet (20 mg total) by mouth daily as needed for erectile dysfunction. 10 tablet 0   telmisartan-hydrochlorothiazide (MICARDIS HCT) 80-25 MG tablet Take 1 tablet by mouth daily. 90 tablet 1   No  current facility-administered medications for this visit.    Family History Family History  Problem Relation Age of Onset   Breast cancer Mother    Hypertension Mother    Diabetes Father    Hypertension Father    Hypertension Sister    Pancreatic disease Sister    Diabetes Sister    Hypertension Sister    Hypertension Brother    Diverticulosis Brother    Prostate cancer Maternal Uncle    Prostate cancer Maternal Uncle    Dementia Maternal Grandmother    Leukemia Maternal Grandfather    Heart disease Paternal Grandmother    Stomach cancer Paternal Grandfather    Kidney  cancer Neg Hx    Bladder Cancer Neg Hx       Social History Social History   Tobacco Use   Smoking status: Never   Smokeless tobacco: Never  Vaping Use   Vaping Use: Never used  Substance Use Topics   Alcohol use: No    Alcohol/week: 0.0 standard drinks   Drug use: No        Review of Systems  Constitutional: Negative.   HENT: Negative.    Eyes: Negative.   Respiratory: Negative.    Cardiovascular: Negative.   Gastrointestinal: Negative.   Genitourinary: Negative.   Skin: Negative.   Neurological: Negative.   Psychiatric/Behavioral: Negative.       Physical Exam Blood pressure (!) 134/91, pulse 81, temperature 98.3 F (36.8 C), temperature source Oral, height 5\' 11"  (1.803 m), weight (!) 327 lb 6.4 oz (148.5 kg), SpO2 94 %. Last Weight  Most recent update: 10/01/2020 11:29 AM    Weight  148.5 kg (327 lb 6.4 oz)               CONSTITUTIONAL: Well developed, and nourished, appropriately responsive and aware without distress.  Morbidly obese gentleman well-kempt. EYES: Sclera non-icteric.   EARS, NOSE, MOUTH AND THROAT: Mask worn.    Hearing is intact to voice.  NECK: Trachea is midline, and there is no jugular venous distension.  LYMPH NODES:  Lymph nodes in the neck are not enlarged. RESPIRATORY:  Lungs are clear, and breath sounds are equal bilaterally. Normal respiratory effort without pathologic use of accessory muscles. CARDIOVASCULAR: Heart is regular in rate and rhythm. GI: The abdomen is there is a 1 cm fatty nodule at the inferior aspect of his umbilicus.  I believe the underlying fascial defect is significantly smaller.  Otherwise his abdomen is soft, nontender, and nondistended. There were no other palpable masses. I did not appreciate hepatosplenomegaly. MUSCULOSKELETAL:  Symmetrical muscle tone appreciated in all four extremities.    SKIN: Skin turgor is normal. No pathologic skin lesions appreciated.  NEUROLOGIC:  Motor and sensation appear grossly  normal.  Cranial nerves are grossly without defect. PSYCH:  Alert and oriented to person, place and time. Affect is appropriate for situation.  Data Reviewed I have personally reviewed what is currently available of the patient's imaging, recent labs and medical records.   Labs:  CBC Latest Ref Rng & Units 09/25/2020 03/20/2020 08/05/2019  WBC 3.8 - 10.8 Thousand/uL 7.4 6.6 7.7  Hemoglobin 13.2 - 17.1 g/dL 08/07/2019 33.2 95.1  Hematocrit 38.5 - 50.0 % 45.7 44.7 46.2  Platelets 140 - 400 Thousand/uL 199 198 206   CMP Latest Ref Rng & Units 09/25/2020 03/20/2020 03/22/2019  Glucose 65 - 99 mg/dL 85 90 94  BUN 7 - 25 mg/dL 12 15 13   Creatinine 0.70 - 1.30 mg/dL 05/22/2019 1.66  Sodium 135 - 146 mmol/L 138 142 139  Potassium 3.5 - 5.3 mmol/L 3.8 3.9 4.1  Chloride 98 - 110 mmol/L 101 105 103  CO2 20 - 32 mmol/L 28 26 28   Calcium 8.6 - 10.3 mg/dL 9.7 9.7 9.6  Total Protein 6.1 - 8.1 g/dL 6.9 6.8 6.6  Total Bilirubin 0.2 - 1.2 mg/dL 0.7 0.6 0.4  Alkaline Phos 40 - 115 U/L - - -  AST 10 - 35 U/L 17 14 15   ALT 9 - 46 U/L 23 16 20       Imaging:  Within last 24 hrs: No results found.  Assessment    Minimally symptomatic umbilical hernia Morbid obesity with BMI of 45.7  Patient Active Problem List   Diagnosis Date Noted   Umbilical hernia without obstruction and without gangrene 10/01/2020   Insulin resistance 07/26/2018   Recurrent hematuria 03/29/2018   Iron deficiency anemia due to chronic blood loss 03/29/2018   Pure hypercholesterolemia 03/29/2018   History of kidney stones 05/17/2017   Abnormal ECG 05/30/2016   History of iron deficiency anemia 04/28/2016   History of diverticulitis 03/04/2016   Hyperlipidemia 01/27/2015   Hematuria, gross 07/10/2014   Failure of erection 07/10/2014   Hypertension, benign 07/10/2014   Morbid obesity (HCC) 10/09/2013   Vitamin D deficiency 10/09/2013    Plan    We discussed surgical options in detail.  Due to the anticipated small size of the  fascial defect and the fact that it is likely chronically incarcerated with preperitoneal adipose the risks of incarceration appear to be quite small.  He really has very limited to no pain whatsoever, yet remaining quite active.  He is well motivated to pursue lifestyle changes will result normal weight loss necessary to diminish his risk of recurrence if we do a primary repair.  Were in agreement that small fascial defect does not warrant mesh, and hopefully mesh can be avoided should reproduce a lower BMI and pursue primary repair at a later time. We will follow-up in 6 months to assess the progress of his weight loss.  Face-to-face time spent with the patient and accompanying care providers(if present) was 30 minutes, with more than 50% of the time spent counseling, educating, and coordinating care of the patient.    These notes generated with voice recognition software. I apologize for typographical errors.  07/12/2014 M.D., FACS 10/01/2020, 11:42 AM

## 2020-10-01 NOTE — Patient Instructions (Addendum)
If you have any concerns or questions, please feel free to call our office. Follow up in 6 months.   Umbilical Hernia, Adult A hernia is a bulge of tissue that pushes through an opening between muscles. An umbilical hernia happens in the abdomen, near the belly button (umbilicus). The hernia may contain tissues from the small intestine, large intestine, or fatty tissue covering the intestines (omentum). Umbilical hernias in adults tend to get worse over time, and they require surgical treatment. There are several types of umbilical hernias. You may have: A hernia located just above or below the umbilicus (indirect hernia). This is the most common type of umbilical hernia in adults. A hernia that forms through an opening formed by the umbilicus (direct hernia). A hernia that comes and goes (reducible hernia). A reducible hernia may be visible only when you strain, lift something heavy, or cough. This type of hernia can be pushed back into the abdomen (reduced). A hernia that traps abdominal tissue inside the hernia (incarcerated hernia). This type of hernia cannot be reduced. A hernia that cuts off blood flow to the tissues inside the hernia (strangulated hernia). The tissues can start to die if this happens. This type of hernia requires emergency treatment. What are the causes? An umbilical hernia happens when tissue inside the abdomen presses on a weak area of the abdominal muscles. What increases the risk? You may have a greater risk of this condition if you: Are obese. Have had several pregnancies. Have a buildup of fluid inside your abdomen (ascites). Have had surgery that weakens the abdominal muscles. What are the signs or symptoms? The main symptom of this condition is a painless bulge at or near the belly button. A reducible hernia may be visible only when you strain, lift something heavy, or cough. Other symptoms may include: Dull pain. A feeling of pressure. Symptoms of a strangulated  hernia may include: Pain that gets increasingly worse. Nausea and vomiting. Pain when pressing on the hernia. Skin over the hernia becoming red or purple. Constipation. Blood in the stool. How is this diagnosed? This condition may be diagnosed based on: A physical exam. You may be asked to cough or strain while standing. These actions increase the pressure inside your abdomen and force the hernia through the opening in your muscles. Your health care provider may try to reduce the hernia by pressing on it. Your symptoms and medical history. How is this treated? Surgery is the only treatment for an umbilical hernia. Surgery for a strangulated hernia is done as soon as possible. If you have a small hernia that is not incarcerated, you may need to lose weight before having surgery. Follow these instructions at home: Lose weight, if told by your health care provider. Do not try to push the hernia back in. Watch your hernia for any changes in color or size. Tell your health care provider if any changes occur. You may need to avoid activities that increase pressure on your hernia. Do not lift anything that is heavier than 10 lb (4.5 kg) until your health care provider says that this is safe. Take over-the-counter and prescription medicines only as told by your health care provider. Keep all follow-up visits as told by your health care provider. This is important. Contact a health care provider if: Your hernia gets larger. Your hernia becomes painful. Get help right away if: You develop sudden, severe pain near the area of your hernia. You have pain as well as nausea or vomiting.  You have pain and the skin over your hernia changes color. You develop a fever. This information is not intended to replace advice given to you by your health care provider. Make sure you discuss any questions you have with your health care provider.                                                                           Weight Loss  Calories are units of energy. Your body needs a certain number of calories from food to keep going throughout the day. When you eat or drink more calories than your body needs, your body stores the extra calories mostly as fat. When you eat or drink fewer calories than your body needs, your body burns fat to get the energy it needs. Calorie counting means keeping track of how many calories you eat and drink each day. Calorie counting can be helpful if you need to lose weight. If you eat fewer calories than your body needs, you should lose weight. Ask your health care provider what a healthy weight is for you. For calorie counting to work, you will need to eat the right number of calories each day to lose a healthy amount of weight per week. A dietitian can help you figure out how many calories you need in a day and will suggest ways to reach your calorie goal. A healthy amount of weight to lose each week is usually 1-2 lb (0.5-0.9 kg). This usually means that your daily calorie intake should be reduced by 500-750 calories. Eating 1,200-1,500 calories a day can help most women lose weight. Eating 1,500-1,800 calories a day can help most men lose weight. What do I need to know about calorie counting? Work with your health care provider or dietitian to determine how many calories you should get each day. To meet your daily calorie goal, you will need to: Find out how many calories are in each food that you would like to eat. Try to do this before you eat. Decide how much of the food you plan to eat. Keep a food log. Do this by writing down what you ate and how many calories it had. To successfully lose weight, it is important to balance calorie counting with a healthy lifestyle that includes regular activity. Where do I find calorie information? The number of calories in a food can be found on a Nutrition Facts label. If a food does not have a Nutrition Facts label, try to look up the  calories online or ask your dietitian for help. Remember that calories are listed per serving. If you choose to have more than one serving of a food, you will have to multiply the calories per serving by the number of servings you plan to eat. For example, the label on a package of bread might say that a serving size is 1 slice and that there are 90 calories in a serving. If you eat 1 slice, you will have eaten 90 calories. If you eat 2 slices, you will have eaten 180 calories. How do I keep a food log? After each time that you eat, record the following in your food log as soon as possible: What you ate. Be  sure to include toppings, sauces, and other extras on the food. How much you ate. This can be measured in cups, ounces, or number of items. How many calories were in each food and drink. The total number of calories in the food you ate. Keep your food log near you, such as in a pocket-sized notebook or on an app or website on your mobile phone. Some programs will calculate calories for you and show you how many calories you have left to meet your daily goal. What are some portion-control tips? Know how many calories are in a serving. This will help you know how many servings you can have of a certain food. Use a measuring cup to measure serving sizes. You could also try weighing out portions on a kitchen scale. With time, you will be able to estimate serving sizes for some foods. Take time to put servings of different foods on your favorite plates or in your favorite bowls and cups so you know what a serving looks like. Try not to eat straight from a food's packaging, such as from a bag or box. Eating straight from the package makes it hard to see how much you are eating and can lead to overeating. Put the amount you would like to eat in a cup or on a plate to make sure you are eating the right portion. Use smaller plates, glasses, and bowls for smaller portions and to prevent overeating. Try not  to multitask. For example, avoid watching TV or using your computer while eating. If it is time to eat, sit down at a table and enjoy your food. This will help you recognize when you are full. It will also help you be more mindful of what and how much you are eating. What are tips for following this plan? Reading food labels Check the calorie count compared with the serving size. The serving size may be smaller than what you are used to eating. Check the source of the calories. Try to choose foods that are high in protein, fiber, and vitamins, and low in saturated fat, trans fat, and sodium. Shopping Read nutrition labels while you shop. This will help you make healthy decisions about which foods to buy. Pay attention to nutrition labels for low-fat or fat-free foods. These foods sometimes have the same number of calories or more calories than the full-fat versions. They also often have added sugar, starch, or salt to make up for flavor that was removed with the fat. Make a grocery list of lower-calorie foods and stick to it. Cooking Try to cook your favorite foods in a healthier way. For example, try baking instead of frying. Use low-fat dairy products. Meal planning Use more fruits and vegetables. One-half of your plate should be fruits and vegetables. Include lean proteins, such as chicken, [place], and fish. Lifestyle Each week, aim to do one of the following: 150 minutes of moderate exercise, such as walking. 75 minutes of vigorous exercise, such as running. General information Know how many calories are in the foods you eat most often. This will help you calculate calorie counts faster. Find a way of tracking calories that works for you. Get creative. Try different apps or programs if writing down calories does not work for you. What foods should I eat?  Eat nutritious foods. It is better to have a nutritious, high-calorie food, such as an avocado, than a food with few nutrients, such as  a bag of potato chips. Use your calories on  foods and drinks that will fill you up and will not leave you hungry soon after eating. Examples of foods that fill you up are nuts and nut butters, vegetables, lean proteins, and high-fiber foods such as whole grains. High-fiber foods are foods with more than 5 g of fiber per serving. Pay attention to calories in drinks. Low-calorie drinks include water and unsweetened drinks. The items listed above may not be a complete list of foods and beverages you can eat. Contact a dietitian for more information. What foods should I limit? Limit foods or drinks that are not good sources of vitamins, minerals, or protein or that are high in unhealthy fats. These include: Candy. Other sweets. Sodas, specialty coffee drinks, alcohol, and juice. The items listed above may not be a complete list of foods and beverages you should avoid. Contact a dietitian for more information. How do I count calories when eating out? Pay attention to portions. Often, portions are much larger when eating out. Try these tips to keep portions smaller: Consider sharing a meal instead of getting your own. If you get your own meal, eat only half of it. Before you start eating, ask for a container and put half of your meal into it. When available, consider ordering smaller portions from the menu instead of full portions. Pay attention to your food and drink choices. Knowing the way food is cooked and what is included with the meal can help you eat fewer calories. If calories are listed on the menu, choose the lower-calorie options. Choose dishes that include vegetables, fruits, whole grains, low-fat dairy products, and lean proteins. Choose items that are boiled, broiled, grilled, or steamed. Avoid items that are buttered, battered, fried, or served with cream sauce. Items labeled as crispy are usually fried, unless stated otherwise. Choose water, low-fat milk, unsweetened iced tea, or other  drinks without added sugar. If you want an alcoholic beverage, choose a lower-calorie option, such as a glass of wine or light beer. Ask for dressings, sauces, and syrups on the side. These are usually high in calories, so you should limit the amount you eat. If you want a salad, choose a garden salad and ask for grilled meats. Avoid extra toppings such as bacon, cheese, or fried items. Ask for the dressing on the side, or ask for olive oil and vinegar or lemon to use as dressing. Estimate how many servings of a food you are given. Knowing serving sizes will help you be aware of how much food you are eating at restaurants. Where to find more information Centers for Disease Control and Prevention: FootballExhibition.com.br U.[place]. Department of Agriculture: WrestlingReporter.dk Summary Calorie counting means keeping track of how many calories you eat and drink each day. If you eat fewer calories than your body needs, you should lose weight. A healthy amount of weight to lose per week is usually 1-2 lb (0.5-0.9 kg). This usually means reducing your daily calorie intake by 500-750 calories. The number of calories in a food can be found on a Nutrition Facts label. If a food does not have a Nutrition Facts label, try to look up the calories online or ask your dietitian for help. Use smaller plates, glasses, and bowls for smaller portions and to prevent overeating. Use your calories on foods and drinks that will fill you up and not leave you hungry shortly after a meal. This information is not intended to replace advice given to you by your health care provider. Make sure you discuss any  questions you have with your health care provider. Document Revised: 02/14/2019 Document Reviewed: 02/14/2019 Elsevier Patient Education  2022 [employer].

## 2020-10-06 ENCOUNTER — Other Ambulatory Visit: Payer: Self-pay | Admitting: Family Medicine

## 2020-10-06 ENCOUNTER — Telehealth: Payer: Self-pay

## 2020-10-06 MED ORDER — ROSUVASTATIN CALCIUM 5 MG PO TABS: 5.0000 mg | ORAL_TABLET | Freq: Every day | ORAL | 1 refills | Status: DC

## 2020-10-06 NOTE — Telephone Encounter (Signed)
Spoke to patient relayed results per Dr. Carlynn Purl. Patient verbalized understanding and had no questions or concerns to express at this time.

## 2020-10-06 NOTE — Telephone Encounter (Signed)
Copied from CRM [id number]. Topic: General - Inquiry >> Oct 06, 2020  9:33 AM Daphine Deutscher D wrote: Reason for CRM: Pt wants to know if Dr. Carlynn Purl has looked at his cholesterol labs and if so can she send his medication to the pharmacy  [facility] [place]

## 2021-02-16 ENCOUNTER — Other Ambulatory Visit: Payer: Self-pay

## 2021-02-16 ENCOUNTER — Ambulatory Visit: Payer: BC Managed Care – PPO | Admitting: Urology

## 2021-02-16 ENCOUNTER — Other Ambulatory Visit
Admission: RE | Admit: 2021-02-16 | Discharge: 2021-02-16 | Disposition: A | Discharge status: Home / Self Care | Payer: BC Managed Care – PPO | Attending: Urology | Admitting: Urology

## 2021-02-16 ENCOUNTER — Other Ambulatory Visit: Payer: Self-pay | Admitting: *Deleted

## 2021-02-16 ENCOUNTER — Encounter: Payer: Self-pay | Admitting: Urology

## 2021-02-16 VITALS — BP 152/93 | HR 121 | Ht 71.0 in | Wt 320.0 lb

## 2021-02-16 DIAGNOSIS — N029 Recurrent and persistent hematuria with unspecified morphologic changes: Secondary | ICD-10-CM | POA: Diagnosis not present

## 2021-02-16 DIAGNOSIS — R310 Gross hematuria: Secondary | ICD-10-CM | POA: Diagnosis not present

## 2021-02-16 LAB — URINALYSIS, COMPLETE (UACMP) WITH MICROSCOPIC
Bacteria, UA: NONE SEEN
Bilirubin Urine: NEGATIVE
Glucose, UA: NEGATIVE mg/dL
Ketones, ur: NEGATIVE mg/dL
Leukocytes,Ua: NEGATIVE
Nitrite: NEGATIVE
Protein, ur: NEGATIVE mg/dL
Specific Gravity, Urine: 1.02 (ref 1.005–1.030)
pH: 5.5 (ref 5.0–8.0)

## 2021-02-16 NOTE — Progress Notes (Signed)
° °  02/16/2021 9:35 AM   Angel Chase [birth year] [id number]  Reason for visit: Follow up chronic gross hematuria  HPI: [name] is a 56 year old African-American male with morbid obesity and hypertension that was previously followed by Dr. Pilar Jarvis for recurrent gross hematuria.  He has a complex urologic history.  To briefly summarize, he reports 30 years of persistent pink to red urine with small clots. This has required iron supplementation however no blood transfusions.  He is completely asymptomatic and he denies any flank pain, urinary symptoms, or retention from clots.  He underwent extensive work-up previously including a negative renal biopsy in 2000 at [facility], cystoscopy(noted to have localizing left sided hematuria from left ureteral orifice), retrograde pyelograms, left diagnostic ureteroscopy in 2018 with Dr. Pilar Jarvis without any abnormal findings, and 29mm renal stone removal, as well as left sided angiography with interventional radiology in July 2018 which was essentially negative.  There was a possibility of a chronic nephritis or vasculitis, but no AV malformation or active extravasation.   Renal function is normal.   Interestingly, he had cessation of all gross hematuria for about a year after getting the COVID-vaccine.  He is here today after developing some intermittent gross hematuria and left-sided flank pain over the last month that has happened about 3-4 times.  This seems to be worse with strenuous activity.  He denies any dysuria or urinary symptoms.  Largest clot size is about the size of a pencil eraser.  He denies any hematuria today.  Urinalysis today with 6-10 RBCs but otherwise benign.  He wonders if he could be passing a stone.  With his long history(>30 years) of chronic intermittent hematuria reassurance was provided, however with his history of stones and the fact that he has not had imaging in a few years, I recommended CT urogram, and will call with those  results.   Billey Co, [facility] [address], [place], [place] [address] [phone]

## 2021-03-02 ENCOUNTER — Ambulatory Visit
Admission: RE | Admit: 2021-03-02 | Discharge: 2021-03-02 | Disposition: A | Discharge status: Home / Self Care | Payer: BC Managed Care – PPO | Source: Ambulatory Visit | Attending: Urology | Admitting: Urology

## 2021-03-02 ENCOUNTER — Other Ambulatory Visit: Payer: Self-pay

## 2021-03-02 DIAGNOSIS — R310 Gross hematuria: Secondary | ICD-10-CM | POA: Insufficient documentation

## 2021-03-02 DIAGNOSIS — R319 Hematuria, unspecified: Secondary | ICD-10-CM | POA: Diagnosis not present

## 2021-03-02 DIAGNOSIS — R109 Unspecified abdominal pain: Secondary | ICD-10-CM | POA: Diagnosis not present

## 2021-03-02 LAB — POCT I-STAT CREATININE: Creatinine, Ser: 1 mg/dL (ref 0.61–1.24)

## 2021-03-02 MED ORDER — IOHEXOL 350 MG/ML SOLN
100.0000 mL | Freq: Once | INTRAVENOUS | Status: AC | PRN
  Administered 2021-03-02: 100 mL via INTRAVENOUS

## 2021-03-25 NOTE — Progress Notes (Signed)
Name: Angel Chase   MRN: [id number]    DOB: Jul 11, 1965   Date:03/26/2021       Progress Note  Subjective  Chief Complaint  Follow up   HPI  Iron deficiency anemia due macroscopic hematuria: symptoms started over 20 years ago. He sees Insurance underwriter, hematologist and Nephrologist ( not recently)  Last labs was good, normal HCT. He still has hematuria daily No fever or chills or dysuria . Last CBC was normal. Recent CT below    IMPRESSION:03/02/2021   1. No hydronephrosis.  No renal, ureteral or bladder calculi. 2. Bilateral renal cysts.  No solid enhancing renal mass. 3. Cholelithiasis without findings of acute cholecystitis. 4. Left-sided colonic diverticulosis without findings of acute diverticulitis. 5. Moderate-sized fat containing ventral hernia.   HTN: he is taking medication, denies side effects, bp is at goal today. No chest pain, dizziness or palpitation . Continue medication    Insulin resistance/morbid obesity: he denies polyphagia, polydipsia or polyuria , he does not want to start medications. 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, explained to him Metformin does not cause hypoglycemia.Marland Kitchen He wants to think about it    Hyperlipidemia:he is back on Crestor 5 mg, last LDL showed improvement of LDL but still not at goal, discussed adding Zetia today , he would like to stay on the current dose until next lab check    .The 10-year ASCVD risk score (Arnett DK, et al., 2019) is: 10.9%   Values used to calculate the score:     Age: 18 years     Sex: Male     Is Non-Hispanic African American: Yes     Diabetic: No     Tobacco smoker: No     Systolic Blood Pressure: 140 mmHg     Is BP treated: Yes     HDL Cholesterol: 48 mg/dL     Total Cholesterol: 144 mg/dL    Vitamin D deficiency: reminded him to get otc vitamin D 2000 units daily   Umbilical hernia: no problems at this time, however last visit he was  very concerned since his cousin died recently from complications of incarcerated hernia , so we referred him to general surgeon, he needs to lose weight prior to repair.    Patient Active Problem List   Diagnosis Date Noted   Umbilical hernia without obstruction and without gangrene 10/01/2020   Insulin resistance 07/26/2018   Recurrent hematuria 03/29/2018   Iron deficiency anemia due to chronic blood loss 03/29/2018   Pure hypercholesterolemia 03/29/2018   History of kidney stones 05/17/2017   Abnormal ECG 05/30/2016   History of iron deficiency anemia 04/28/2016   History of diverticulitis 03/04/2016   Hyperlipidemia 01/27/2015   Hematuria, gross 07/10/2014   Failure of erection 07/10/2014   Hypertension, benign 07/10/2014   Morbid obesity (HCC) 10/09/2013   Vitamin D deficiency 10/09/2013    Past Surgical History:  Procedure Laterality Date   ANKLE SURGERY Left 03/07/2011   fracture from trauma   COLONOSCOPY WITH PROPOFOL N/A 07/01/2016   Procedure: COLONOSCOPY WITH PROPOFOL;  Surgeon: Wyline Mood, MD;  Location: [facility] ENDOSCOPY;  Service: Endoscopy;  Laterality: N/A;   CYSTOSCOPY W/ RETROGRADES Bilateral 06/01/2016   Procedure: CYSTOSCOPY WITH RETROGRADE PYELOGRAM;  Surgeon: Hildred Laser, MD;  Location: [facility] ORS;  Service: Urology;  Laterality: Bilateral;   CYSTOSCOPY WITH STENT PLACEMENT Left 06/01/2016   Procedure: CYSTOSCOPY WITH STENT PLACEMENT;  Surgeon: Hildred Laser, MD;  Location: [facility] ORS;  Service: Urology;  Laterality: Left;   ESOPHAGOGASTRODUODENOSCOPY (EGD) WITH PROPOFOL N/A 07/01/2016   Procedure: ESOPHAGOGASTRODUODENOSCOPY (EGD) WITH PROPOFOL;  Surgeon: Wyline Mood, MD;  Location[facility] ENDOSCOPY;  Service: Endoscopy;  Laterality: N/A;   FLEXIBLE SIGMOIDOSCOPY N/A 02/24/2017   Procedure: FLEXIBLE SIGMOIDOSCOPY;  Surgeon: Wyline Mood, MD;  Location: [facility] ENDOSCOPY;  Service: Gastroenterology;  Laterality: N/A;   IR RADIOLOGIST EVAL & MGMT  06/16/2016   IR  RENAL SELECTIVE  UNI INC S&I MOD SED  07/28/2016   IR US GUIDE VASC ACCESS LEFT  07/28/2016   STONE EXTRACTION WITH BASKET Left 06/01/2016   Procedure: STONE EXTRACTION WITH BASKET;  Surgeon: Hildred Laser, MD;  Location: [facility] ORS;  Service: Urology;  Laterality: Left;   TONSILLECTOMY     TONSILLECTOMY AND ADENOIDECTOMY  age 58   URETEROSCOPY Left 06/01/2016   Procedure: URETEROSCOPY;  Surgeon: Hildred Laser, MD;  Location: [facility] ORS;  Service: Urology;  Laterality: Left;    Family History  Problem Relation Age of Onset   Breast cancer Mother    Hypertension Mother    Diabetes Father    Hypertension Father    Hypertension Sister    Pancreatic disease Sister    Diabetes Sister    Hypertension Sister    Hypertension Brother    Diverticulosis Brother    Prostate cancer Maternal Uncle    Prostate cancer Maternal Uncle    Dementia Maternal Grandmother    Leukemia Maternal Grandfather    Heart disease Paternal Grandmother    Stomach cancer Paternal Grandfather    Kidney cancer Neg Hx    Bladder Cancer Neg Hx     Social History   Tobacco Use   Smoking status: Never   Smokeless tobacco: Never  Substance Use Topics   Alcohol use: No    Alcohol/week: 0.0 standard drinks     Current Outpatient Medications:    Cholecalciferol (VITAMIN D) 50 MCG (2000 UT) CAPS, Take 1 capsule (2,000 Units total) by mouth daily., Disp: 30 capsule, Rfl: 11   ferrous sulfate 325 (65 FE) MG EC tablet, Take 325 mg by mouth daily with breakfast. (0600 & 1530), Disp: , Rfl:    Multiple Vitamin (MULTIVITAMIN WITH MINERALS) TABS tablet, Take 1 tablet by mouth daily at 6 (six) AM., Disp: , Rfl:    rosuvastatin (CRESTOR) 5 MG tablet, Take 1 tablet (5 mg total) by mouth daily., Disp: 90 tablet, Rfl: 1   tadalafil (CIALIS) 20 MG tablet, Take 1 tablet (20 mg total) by mouth daily as needed for erectile dysfunction., Disp: 10 tablet, Rfl: 0   telmisartan-hydrochlorothiazide (MICARDIS HCT) 80-25 MG tablet,  Take 1 tablet by mouth daily., Disp: 90 tablet, Rfl: 1  No Known Allergies  I personally reviewed active problem list, medication list, allergies, family history, social history with the patient/caregiver today.   ROS  Constitutional: Negative for fever or weight change.  Respiratory: Negative for cough and shortness of breath.   Cardiovascular: Negative for chest pain or palpitations.  Gastrointestinal: Negative for abdominal pain, no bowel changes.  Musculoskeletal: Negative for gait problem or joint swelling.  Skin: Negative for rash.  Neurological: Negative for dizziness or headache.  No other specific complaints in a complete review of systems (except as listed in HPI above).   Objective  Vitals:   03/26/21 0939  BP: 140/78  Pulse: 93  Resp: 16  Temp: 97.9 F (36.6 C)  TempSrc: Oral  SpO2: 95%  Weight: (!) 329 lb 8 oz (149.5 kg)  Height: 5\' 11"  (1.803 m)    Body mass index is 45.96 kg/m.  Physical Exam  Constitutional: Patient appears well-developed and well-nourished. Obese  No distress.  HEENT: head atraumatic, normocephalic, pupils equal and reactive to light, neck supple Cardiovascular: Normal rate, regular rhythm and normal heart sounds.  No murmur heard. No BLE edema. Pulmonary/Chest: Effort normal and breath sounds normal. No respiratory distress. Abdominal: Soft.  There is no tenderness. Ventral hernia  Psychiatric: Patient has a normal mood and affect. behavior is normal. Judgment and thought content normal.   Recent Results (from the past 2160 hour(s))  Urinalysis, Complete w Microscopic     Status: Abnormal   Collection Time: 02/16/21  8:52 AM  Result Value Ref Range   Color, Urine YELLOW YELLOW   APPearance CLEAR CLEAR   Specific Gravity, Urine 1.020 1.005 - 1.030   pH 5.5 5.0 - 8.0   Glucose, UA NEGATIVE NEGATIVE mg/dL   Hgb urine dipstick TRACE (A) NEGATIVE   Bilirubin Urine NEGATIVE NEGATIVE   Ketones, ur NEGATIVE NEGATIVE mg/dL   Protein,  ur NEGATIVE NEGATIVE mg/dL   Nitrite NEGATIVE NEGATIVE   Leukocytes,Ua NEGATIVE NEGATIVE   Squamous Epithelial / LPF 0-5 0 - 5   WBC, UA 0-5 0 - 5 WBC/hpf   RBC / HPF 6-10 0 - 5 RBC/hpf   Bacteria, UA NONE SEEN NONE SEEN   Mucus PRESENT     Comment: Performed at [facility], [address] [place], [place] [id number]  I-STAT creatinine     Status: None   Collection Time: 03/02/21  2:32 PM  Result Value Ref Range   Creatinine, Ser 1.00 0.61 - 1.24 mg/dL     UEA5/4PHQ2/9: Depression screen [facility] 2/9 03/26/2021 09/25/2020 03/20/2020 09/20/2019 03/22/2019  Decreased Interest 0 0 0 0 0  Down, Depressed, Hopeless 0 0 0 0 0  PHQ - 2 Score 0 0 0 0 0  Altered sleeping 0 - - 0 0  Tired, decreased energy 0 - - 0 0  Change in appetite 0 - - 0 0  Feeling bad or failure about yourself  0 - - 0 0  Trouble concentrating 0 - - 0 0  Moving slowly or fidgety/restless 0 - - 0 0  Suicidal thoughts 0 - - 0 0  PHQ-9 Score 0 - - 0 0  Difficult doing work/chores Not difficult at all - - - Not difficult at all  Some recent data might be hidden    phq 9 is negative   Fall Risk: Fall Risk  03/26/2021 09/25/2020 03/20/2020 09/20/2019 03/22/2019  Falls in the past year? 0 1 0 0 0  Number falls in past yr: 0 1 0 1 0  Injury with Fall? 0 0 0 0 0  Risk Factor Category  - - - - -  Risk for fall due to : No Fall Risks No Fall Risks - History of fall(s) -  Follow up Falls prevention discussed Falls prevention discussed - - -      Functional Status Survey: Is the patient deaf or have difficulty hearing?: No Does the patient have difficulty seeing, even when wearing glasses/contacts?: No Does the patient have difficulty concentrating, remembering, or making decisions?: No Does the patient have difficulty walking or climbing stairs?: No Does the patient have difficulty dressing or bathing?: No Does the patient have difficulty doing errands alone such as visiting a doctor's office or shopping?: No  Assessment  & Plan  1. Morbid obesity (HCC)  Discussed with the patient the risk posed by an increased BMI. Discussed importance of portion control, calorie counting and at least 150 minutes of physical activity weekly. Avoid sweet beverages and drink more water. Eat at least 6 servings of fruit and vegetables daily    2. Vitamin D deficiency   3. Essential hypertension  - telmisartan-hydrochlorothiazide (MICARDIS HCT) 80-25 MG tablet; Take 1 tablet by mouth daily.  Dispense: 90 tablet; Refill: 1  4. Insulin resistance  He is not ready to start medications at this time  5. Pure hypercholesterolemia  - rosuvastatin (CRESTOR) 5 MG tablet; Take 1 tablet (5 mg total) by mouth daily.  Dispense: 90 tablet; Refill: 1  6. Ventral hernia without obstruction or gangrene   7. Recurrent hematuria  Seen by Urologist, reviewed results of CT with patient    8. Calculus of gallbladder without cholecystitis without obstruction  Discussed symptoms

## 2021-03-26 ENCOUNTER — Encounter: Payer: Self-pay | Admitting: Family Medicine

## 2021-03-26 ENCOUNTER — Other Ambulatory Visit: Payer: Self-pay

## 2021-03-26 ENCOUNTER — Ambulatory Visit: Payer: BC Managed Care – PPO | Admitting: Family Medicine

## 2021-03-26 DIAGNOSIS — I10 Essential (primary) hypertension: Secondary | ICD-10-CM | POA: Diagnosis not present

## 2021-03-26 DIAGNOSIS — E7800 Pure hypercholesterolemia, unspecified: Secondary | ICD-10-CM

## 2021-03-26 DIAGNOSIS — K439 Ventral hernia without obstruction or gangrene: Secondary | ICD-10-CM

## 2021-03-26 DIAGNOSIS — E88819 Insulin resistance, unspecified: Secondary | ICD-10-CM

## 2021-03-26 DIAGNOSIS — E559 Vitamin D deficiency, unspecified: Secondary | ICD-10-CM | POA: Diagnosis not present

## 2021-03-26 DIAGNOSIS — N029 Recurrent and persistent hematuria with unspecified morphologic changes: Secondary | ICD-10-CM

## 2021-03-26 DIAGNOSIS — K8020 Calculus of gallbladder without cholecystitis without obstruction: Secondary | ICD-10-CM | POA: Insufficient documentation

## 2021-03-26 DIAGNOSIS — E8881 Metabolic syndrome: Secondary | ICD-10-CM

## 2021-03-26 MED ORDER — ROSUVASTATIN CALCIUM 5 MG PO TABS: 5.0000 mg | ORAL_TABLET | Freq: Every day | ORAL | 1 refills | Status: DC

## 2021-03-26 MED ORDER — TELMISARTAN-HCTZ 80-25 MG PO TABS: 1.0000 | ORAL_TABLET | Freq: Every day | ORAL | 1 refills | Status: DC

## 2021-04-15 ENCOUNTER — Ambulatory Visit: Payer: BC Managed Care – PPO | Admitting: Surgery

## 2021-04-27 ENCOUNTER — Encounter: Payer: Self-pay | Admitting: Surgery

## 2021-04-27 ENCOUNTER — Ambulatory Visit: Payer: BC Managed Care – PPO | Admitting: Surgery

## 2021-04-27 VITALS — BP 138/87 | HR 84 | Temp 98.0°F | Ht 71.0 in | Wt 333.6 lb

## 2021-04-27 DIAGNOSIS — K429 Umbilical hernia without obstruction or gangrene: Secondary | ICD-10-CM

## 2021-04-27 NOTE — Progress Notes (Signed)
Surgical Clinic Progress/Follow-up Note  ? ?HPI:  ?56 y.o. Male presents to clinic for weight loss follow-up eventual repair of umbilical hernia. ?He reports he is really not done anything in terms of lifestyle changes to diminish his BMI.  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  He denies any pain.  He denies nausea, vomiting.  He reports twice daily bowel movements, sometimes having difficult move movements.  We reviewed his CT scan imaging and reviewed that he has diverticulosis as well.  We have again encouraged him to make some alterations, that are truly sustainable and can avoid excess caloric intake.  Also encouraged him to find some additional exercise opportunities to burn some more calories.  We offered referral for medical weight loss, and even consideration of the potential bariatric surgery.  At this time I do not feel there is a need to pursue hernia repair, due to no involvement of small bowel or colon.  And due to the lack of symptoms present. ?Review of Systems:  ?Constitutional: denies fever/chills  ?ENT: denies sore throat, hearing problems  ?Respiratory: denies shortness of breath, wheezing  ?Cardiovascular: denies chest pain, palpitations  ?Gastrointestinal: denies abdominal pain, N/V, or diarrhea/and bowel function as per interval history ?Skin: Denies any other rashes or skin discolorations as per interval history ? ?Vital Signs:  ?BP 138/87   Pulse 84   Temp 98 ?F (36.7 ?C) (Oral)   Ht 5\' 11"  (1.803 m)   Wt (!) 333 lb 9.6 oz (151.3 kg)   SpO2 100%   BMI 46.53 kg/m?   ? ?Physical Exam:  ?Constitutional:  ?-- Obese body habitus  ?-- Awake, alert, and oriented x3  ?Pulmonary:  ?-- Breathing non-labored at rest ?Gastrointestinal:  ?-- Soft and non-distended, non-tender/ ?Musculoskeletal / Integumentary:  ?-- Wounds or skin discoloration: None appreciated. ?--  Extremities: B/L UE and LE FROM, hands and feet warm, no edema  ? ?Imaging: No new pertinent imaging available for review, the umbilical defect is tiny,  the defect extremely small.  ? ? ?Assessment:  ?56 y.o. yo Male with a problem list including...  ?Patient Active Problem List  ? Diagnosis Date Noted  ? Ventral hernia without obstruction or gangrene 03/26/2021  ? Calculus of gallbladder without cholecystitis without obstruction 03/26/2021  ? Umbilical hernia without obstruction and without gangrene 10/01/2020  ? Insulin resistance 07/26/2018  ? Recurrent hematuria 03/29/2018  ? Iron deficiency anemia due to chronic blood loss 03/29/2018  ? Pure hypercholesterolemia 03/29/2018  ? History of kidney stones 05/17/2017  ? Abnormal ECG 05/30/2016  ? History of iron deficiency anemia 04/28/2016  ? History of diverticulitis 03/04/2016  ? Hyperlipidemia 01/27/2015  ? Hematuria, gross 07/10/2014  ? Failure of erection 07/10/2014  ? Hypertension, benign 07/10/2014  ? Morbid obesity (HCC) 10/09/2013  ? Vitamin D deficiency 10/09/2013  ?  ?presents to clinic for follow-up evaluation of umbilical hernia with morbid obesity, progressing well. ? ?Plan:  ?            - return to clinic 6 months or as needed, instructed to call office if any questions or concerns ?Advise referral to medical weight loss, consideration of bariatric surgery for weight loss. ?All of the above recommendations were discussed with the patient, and all of patient's questions were answered to his expressed satisfaction. ? ?These notes generated with voice recognition  software. I apologize for typographical errors. ? ?Campbell Lerner, MD, FACS ?[place]: [facility] ?General Surgery - Partnering for exceptional care. ?Office: [phone]  ?

## 2021-04-27 NOTE — Patient Instructions (Addendum)
If you have any concerns or questions, please feel free to call our office.  ? ? ?Advised to pursue a goal of 25 to 30 g of fiber daily.  Made aware that the majority of this may be through natural sources, but advised to be aware of actual consumption and to ensure minimal consumption by daily supplementation.  Various forms of supplements discussed.  Recommended Psyllium husk, that mixes well with applesauce, or the powder which goes down well shaken with chocolate milk.  ?Strongly advised to consume more fluids to ensure adequate hydration, instructed to watch color of urine to determine adequacy of hydration.  Clarity is pursued in urine output, and bowel activity that correlates to significant meal intake.   ?We need to avoid deferring having bowel movements, advised to take the time at the first sign of sensation, typically following meals, and in the morning.   ?Subsequent utilization of MiraLAX may be needed ensure at least daily movement, ideally twice daily bowel movements.  If multiple doses of MiraLAX are necessary utilize them. ?Never skip a day...  ?To be regular, we must do the above EVERY day.  ? ? ? ?Calorie Counting for Weight Loss ? ?Calories are units of energy. Your body needs a certain number of calories from food to keep going throughout the day. When you eat or drink more calories than your body needs, your body stores the extra calories mostly as fat. When you eat or drink fewer calories than your body needs, your body burns fat to get the energy it needs. ?Calorie counting means keeping track of how many calories you eat and drink each day. Calorie counting can be helpful if you need to lose weight. If you eat fewer calories than your body needs, you should lose weight. Ask your health care provider what a healthy weight is for you. ?For calorie counting to work, you will need to eat the right number of calories each day to lose a healthy amount of weight per week. A dietitian can help you  figure out how many calories you need in a day and will suggest ways to reach your calorie goal. ?A healthy amount of weight to lose each week is usually 1-2 lb (0.5-0.9 kg). This usually means that your daily calorie intake should be reduced by 500-750 calories. ?Eating 1,200-1,500 calories a day can help most women lose weight. ?Eating 1,500-1,800 calories a day can help most men lose weight. ?What do I need to know about calorie counting? ?Work with your health care provider or dietitian to determine how many calories you should get each day. To meet your daily calorie goal, you will need to: ?Find out how many calories are in each food that you would like to eat. Try to do this before you eat. ?Decide how much of the food you plan to eat. ?Keep a food log. Do this by writing down what you ate and how many calories it had. ?To successfully lose weight, it is important to balance calorie counting with a healthy lifestyle that includes regular activity. ?Where do I find calorie information? ?The number of calories in a food can be found on a Nutrition Facts label. If a food does not have a Nutrition Facts label, try to look up the calories online or ask your dietitian for help. ?Remember that calories are listed per serving. If you choose to have more than one serving of a food, you will have to multiply the calories per serving by the  number of servings you plan to eat. For example, the label on a package of bread might say that a serving size is 1 slice and that there are 90 calories in a serving. If you eat 1 slice, you will have eaten 90 calories. If you eat 2 slices, you will have eaten 180 calories. ?How do I keep a food log? ?After each time that you eat, record the following in your food log as soon as possible: ?What you ate. Be sure to include toppings, sauces, and other extras on the food. ?How much you ate. This can be measured in cups, ounces, or number of items. ?How many calories were in each food  and drink. ?The total number of calories in the food you ate. ?Keep your food log near you, such as in a pocket-sized notebook or on an app or website on your mobile phone. Some programs will calculate calories for you and show you how many calories you have left to meet your daily goal. ?What are some portion-control tips? ?Know how many calories are in a serving. This will help you know how many servings you can have of a certain food. ?Use a measuring cup to measure serving sizes. You could also try weighing out portions on a kitchen scale. With time, you will be able to estimate serving sizes for some foods. ?Take time to put servings of different foods on your favorite plates or in your favorite bowls and cups so you know what a serving looks like. ?Try not to eat straight from a food's packaging, such as from a bag or box. Eating straight from the package makes it hard to see how much you are eating and can lead to overeating. Put the amount you would like to eat in a cup or on a plate to make sure you are eating the right portion. ?Use smaller plates, glasses, and bowls for smaller portions and to prevent overeating. ?Try not to multitask. For example, avoid watching TV or using your computer while eating. If it is time to eat, sit down at a table and enjoy your food. This will help you recognize when you are full. It will also help you be more mindful of what and how much you are eating. ?What are tips for following this plan? ?Reading food labels ?Check the calorie count compared with the serving size. The serving size may be smaller than what you are used to eating. ?Check the source of the calories. Try to choose foods that are high in protein, fiber, and vitamins, and low in saturated fat, trans fat, and sodium. ?Shopping ?Read nutrition labels while you shop. This will help you make healthy decisions about which foods to buy. ?Pay attention to nutrition labels for low-fat or fat-free foods. These foods  sometimes have the same number of calories or more calories than the full-fat versions. They also often have added sugar, starch, or salt to make up for flavor that was removed with the fat. ?Make a grocery list of lower-calorie foods and stick to it. ?Cooking ?Try to cook your favorite foods in a healthier way. For example, try baking instead of frying. ?Use low-fat dairy products. ?Meal planning ?Use more fruits and vegetables. One-half of your plate should be fruits and vegetables. ?Include lean proteins, such as chicken, [place], and fish. ?Lifestyle ?Each week, aim to do one of the following: ?150 minutes of moderate exercise, such as walking. ?75 minutes of vigorous exercise, such as running. ?General information ?Know  how many calories are in the foods you eat most often. This will help you calculate calorie counts faster. ?Find a way of tracking calories that works for you. Get creative. Try different apps or programs if writing down calories does not work for you. ?What foods should I eat? ? ?Eat nutritious foods. It is better to have a nutritious, high-calorie food, such as an avocado, than a food with few nutrients, such as a bag of potato chips. ?Use your calories on foods and drinks that will fill you up and will not leave you hungry soon after eating. ?Examples of foods that fill you up are nuts and nut butters, vegetables, lean proteins, and high-fiber foods such as whole grains. High-fiber foods are foods with more than 5 g of fiber per serving. ?Pay attention to calories in drinks. Low-calorie drinks include water and unsweetened drinks. ?The items listed above may not be a complete list of foods and beverages you can eat. Contact a dietitian for more information. ?What foods should I limit? ?Limit foods or drinks that are not good sources of vitamins, minerals, or protein or that are high in unhealthy fats. These include: ?Candy. ?Other sweets. ?Sodas, specialty coffee drinks, alcohol, and  juice. ?The items listed above may not be a complete list of foods and beverages you should avoid. Contact a dietitian for more information. ?How do I count calories when eating out? ?Pay attention to portions.

## 2021-05-06 ENCOUNTER — Ambulatory Visit: Payer: Self-pay | Admitting: Urology

## 2021-09-15 DIAGNOSIS — H35033 Hypertensive retinopathy, bilateral: Secondary | ICD-10-CM | POA: Diagnosis not present

## 2021-09-22 NOTE — Progress Notes (Deleted)
Name: Angel Chase   MRN: [id number]    DOB: 12/30/1965   Date:09/22/2021       Progress Note  Subjective  Chief Complaint  Follow Up  HPI  Iron deficiency anemia due macroscopic hematuria: symptoms started over 20 years ago. He sees [facility], hematologist and Nephrologist ( not recently)  Last labs was good, normal HCT. He still has hematuria daily No fever or chills or dysuria . Last CBC was normal. Recent CT below    IMPRESSION:03/02/2021   1. No hydronephrosis.  No renal, ureteral or bladder calculi. 2. Bilateral renal cysts.  No solid enhancing renal mass. 3. Cholelithiasis without findings of acute cholecystitis. 4. Left-sided colonic diverticulosis without findings of acute diverticulitis. 5. Moderate-sized fat containing ventral hernia.   HTN: he is taking medication, denies side effects, bp is at goal today. No chest pain, dizziness or palpitation . Continue medication    Insulin resistance/morbid obesity: he denies polyphagia, polydipsia or polyuria , he does not want to start medications. 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, explained to him Metformin does not cause hypoglycemia.Marland Kitchen He wants to think about it    Hyperlipidemia:he is back on Crestor 5 mg, last LDL showed improvement of LDL but still not at goal, discussed adding Zetia today , he would like to stay on the current dose until next lab check    .The 10-year ASCVD risk score (Arnett DK, et al., 2019) is: 10.9%   Values used to calculate the score:     Age: 56 years     Sex: Male     Is Non-Hispanic African American: Yes     Diabetic: No     Tobacco smoker: No     Systolic Blood Pressure: [id number] mmHg     Is BP treated: Yes     HDL Cholesterol: 48 mg/dL     Total Cholesterol: 144 mg/dL    Vitamin D deficiency: reminded him to get otc vitamin D 2000 units daily   Umbilical hernia: no problems at this time, however last visit he was  very concerned since his cousin died recently from complications of incarcerated hernia , so we referred him to general surgeon, he needs to lose weight prior to repair.    Patient Active Problem List   Diagnosis Date Noted   Ventral hernia without obstruction or gangrene 03/26/2021   Calculus of gallbladder without cholecystitis without obstruction [id number]   Umbilical hernia without obstruction and without gangrene 10/01/2020   Insulin resistance 07/26/2018   Recurrent hematuria 03/29/2018   Iron deficiency anemia due to chronic blood loss 03/29/2018   Pure hypercholesterolemia 03/29/2018   History of kidney stones 05/17/2017   Abnormal ECG 05/30/2016   History of iron deficiency anemia 04/28/2016   History of diverticulitis 03/04/2016   Hyperlipidemia 01/27/2015   Hematuria, gross 07/10/2014   Failure of erection 07/10/2014   Hypertension, benign 07/10/2014   Morbid obesity ([place]) 10/09/2013   Vitamin D deficiency 10/09/2013    Past Surgical History:  Procedure Laterality Date   ANKLE SURGERY Left 03/07/2011   fracture from trauma   COLONOSCOPY WITH PROPOFOL N/A 07/01/2016   Procedure: COLONOSCOPY WITH PROPOFOL;  Surgeon: Jonathon Bellows, MD;  Location: [facility] ENDOSCOPY;  Service: Endoscopy;  Laterality: N/A;   CYSTOSCOPY W/ RETROGRADES Bilateral 06/01/2016   Procedure: CYSTOSCOPY WITH RETROGRADE PYELOGRAM;  Surgeon: Nickie Retort, MD;  Location: [facility] ORS;  Service: Urology;  Laterality: Bilateral;   CYSTOSCOPY WITH STENT PLACEMENT Left 06/01/2016   Procedure: CYSTOSCOPY WITH STENT PLACEMENT;  Surgeon: Hildred Laser, MD;  Location: [facility] ORS;  Service: Urology;  Laterality: Left;   ESOPHAGOGASTRODUODENOSCOPY (EGD) WITH PROPOFOL N/A 07/01/2016   Procedure: ESOPHAGOGASTRODUODENOSCOPY (EGD) WITH PROPOFOL;  Surgeon: Wyline Mood, MD;  Location: [facility] ENDOSCOPY;  Service: Endoscopy;  Laterality: N/A;   FLEXIBLE SIGMOIDOSCOPY N/A 02/24/2017   Procedure: FLEXIBLE SIGMOIDOSCOPY;   Surgeon: Wyline Mood, MD;  Location: [facility] ENDOSCOPY;  Service: Gastroenterology;  Laterality: N/A;   IR RADIOLOGIST EVAL & MGMT  06/16/2016   IR RENAL SELECTIVE  UNI INC S&I MOD SED  07/28/2016   IR US GUIDE VASC ACCESS LEFT  07/28/2016   STONE EXTRACTION WITH BASKET Left 06/01/2016   Procedure: STONE EXTRACTION WITH BASKET;  Surgeon: Hildred Laser, MD;  Location: [facility] ORS;  Service: Urology;  Laterality: Left;   TONSILLECTOMY     TONSILLECTOMY AND ADENOIDECTOMY  age 52   URETEROSCOPY Left 06/01/2016   Procedure: URETEROSCOPY;  Surgeon: Hildred Laser, MD;  Location: [facility] ORS;  Service: Urology;  Laterality: Left;    Family History  Problem Relation Age of Onset   Breast cancer Mother    Hypertension Mother    Diabetes Father    Hypertension Father    Hypertension Sister    Pancreatic disease Sister    Diabetes Sister    Hypertension Sister    Hypertension Brother    Diverticulosis Brother    Prostate cancer Maternal Uncle    Prostate cancer Maternal Uncle    Dementia Maternal Grandmother    Leukemia Maternal Grandfather    Heart disease Paternal Grandmother    Stomach cancer Paternal Grandfather    Kidney cancer Neg Hx    Bladder Cancer Neg Hx     Social History   Tobacco Use   Smoking status: Never   Smokeless tobacco: Never  Substance Use Topics   Alcohol use: No    Alcohol/week: 0.0 standard drinks of alcohol     Current Outpatient Medications:    Cholecalciferol (VITAMIN D) 50 MCG (2000 UT) CAPS, Take 1 capsule (2,000 Units total) by mouth daily., Disp: 30 capsule, Rfl: 11   ferrous sulfate 325 (65 FE) MG EC tablet, Take 325 mg by mouth daily with breakfast. (0600 & 1530), Disp: , Rfl:    Multiple Vitamin (MULTIVITAMIN WITH MINERALS) TABS tablet, Take 1 tablet by mouth daily at 6 (six) AM., Disp: , Rfl:    rosuvastatin (CRESTOR) 5 MG tablet, Take 1 tablet (5 mg total) by mouth daily., Disp: 90 tablet, Rfl: 1   tadalafil (CIALIS) 20 MG tablet, Take 1 tablet  (20 mg total) by mouth daily as needed for erectile dysfunction., Disp: 10 tablet, Rfl: 0   telmisartan-hydrochlorothiazide (MICARDIS HCT) 80-25 MG tablet, Take 1 tablet by mouth daily., Disp: 90 tablet, Rfl: 1  No Known Allergies  I personally reviewed active problem list, medication list, allergies, family history, social history, health maintenance with the patient/caregiver today.   ROS  ***  Objective  There were no vitals filed for this visit.  There is no height or weight on file to calculate BMI.  Physical Exam ***  No results found for this or any previous visit (from the past 2160 hour(s)).   PHQ2/9:    03/26/2021    9:34 AM 09/25/2020    9:12 AM 03/20/2020    9:01 AM 09/20/2019    9:18 AM 03/22/2019    8:35 AM  Depression screen PHQ 2/9  Decreased Interest 0 0 0 0 0  Down, Depressed, Hopeless 0 0 0 0 0  PHQ - 2 Score 0 0 0 0 0  Altered sleeping 0   0 0  Tired, decreased energy 0   0 0  Change in appetite 0   0 0  Feeling bad or failure about yourself  0   0 0  Trouble concentrating 0   0 0  Moving slowly or fidgety/restless 0   0 0  Suicidal thoughts 0   0 0  PHQ-9 Score 0   0 0  Difficult doing work/chores Not difficult at all    Not difficult at all    phq 9 is {gen pos [id number]}   Fall Risk:    03/26/2021    9:34 AM 09/25/2020    9:12 AM 03/20/2020    9:00 AM 09/20/2019    9:18 AM 03/22/2019    8:35 AM  Fall Risk   Falls in the past year? 0 1 0 0 0  Number falls in past yr: 0 1 0 1 0  Injury with Fall? 0 0 0 0 0  Risk for fall due to : No Fall Risks No Fall Risks  History of fall(s)   Follow up Falls prevention discussed Falls prevention discussed         Functional Status Survey:      Assessment & Plan  *** There are no diagnoses linked to this encounter.

## 2021-09-24 ENCOUNTER — Ambulatory Visit: Payer: BC Managed Care – PPO | Admitting: Family Medicine

## 2021-09-29 ENCOUNTER — Other Ambulatory Visit: Payer: Self-pay | Admitting: Family Medicine

## 2021-09-29 ENCOUNTER — Ambulatory Visit: Payer: Self-pay

## 2021-09-29 DIAGNOSIS — I10 Essential (primary) hypertension: Secondary | ICD-10-CM

## 2021-09-29 DIAGNOSIS — E7800 Pure hypercholesterolemia, unspecified: Secondary | ICD-10-CM

## 2021-09-29 NOTE — Patient Instructions (Signed)
Visit Information  Thank you for taking time to visit with me today. Please don't hesitate to contact me if I can be of assistance to you.   Following are the goals we discussed today:   Goals Addressed             This Visit's Progress    COMPLETED: RNCM: Effective Managemen of health and well being       Care Coordination Interventions: Evaluation of current treatment plan related to chronic health conditions and patient's adherence to plan as established by provider Advised patient to call the office for changes in chronic conditions, new concerns or questions Provided education to patient re: the care coordination program and the availability of the RNCM to help with any questions or concerns related to help with effective management of health and well being Reviewed scheduled/upcoming provider appointments including 10-22-2021 at 0940 am, reminder given this am Discussed plans with patient for ongoing care management follow up and provided patient with direct contact information for care management team Advised patient to discuss changes in his chronic conditions, questions, concerns, and recommendations with provider Assessed social determinant of health barriers The patient is scheduled to have his AWV with pcp next month in October             Please call the care guide team at [phone] if you need to schedule an appointment.   If you are experiencing a Mental Health or Behavioral Health Crisis or need someone to talk to, please call the Suicide and Crisis Lifeline: 988 call the [place] National Suicide Prevention Lifeline: (947)377-2494 or TTY: 646-187-9989 TTY (228) 638-3229) to talk to a trained counselor call 1-800-273-TALK (toll free, 24 hour hotline)  Patient verbalizes understanding of instructions and care plan provided today and agrees to view in MyChart. Active MyChart status and patient understanding of how to access instructions and care plan via MyChart  confirmed with patient.     No further follow up required: the patient knows how to reach the [facility] for changes or needs.  Alto Denver RN, MSN, CCM Community Care Coordinator [facility]  [facility] Mobile: [phone]

## 2021-09-29 NOTE — Patient Outreach (Signed)
  Care Coordination   Initial Visit Note   09/29/2021 Name: Angel Chase MRN: [id number] DOB: 1965/09/06  Angel Chase is a 56 y.o. year old male who sees Angel Cory, MD for primary care. I spoke with  Angel Chase by phone today.  What matters to the patients health and wellness today?  To maintain his health and well being. No immediate concerns noted    Goals Addressed             This Visit's Progress    COMPLETED: RNCM: Effective Managemen of health and well being       Care Coordination Interventions: Evaluation of current treatment plan related to chronic health conditions and patient's adherence to plan as established by provider Advised patient to call the office for changes in chronic conditions, new concerns or questions Provided education to patient re: the care coordination program and the availability of the RNCM to help with any questions or concerns related to help with effective management of health and well being Reviewed scheduled/upcoming provider appointments including 10-22-2021 at 0940 am, reminder given this am Discussed plans with patient for ongoing care management follow up and provided patient with direct contact information for care management team Advised patient to discuss changes in his chronic conditions, questions, concerns, and recommendations with provider Assessed social determinant of health barriers The patient is scheduled to have his AWV with pcp next month in October           SDOH assessments and interventions completed:  Yes  SDOH Interventions Today    Flowsheet Row Most Recent Value  SDOH Interventions   Food Insecurity Interventions Intervention Not Indicated  Housing Interventions Intervention Not Indicated  Transportation Interventions Intervention Not Indicated  Utilities Interventions Intervention Not Indicated        Care Coordination Interventions Activated:  Yes  Care Coordination Interventions:  Yes,  provided   Follow up plan: No further intervention required.   Encounter Outcome:  Pt. Visit Completed   Alto Denver RN, MSN, CCM Community Care Coordinator [facility]  [facility] Mobile: [phone]

## 2021-10-04 ENCOUNTER — Telehealth: Payer: Self-pay | Admitting: Family Medicine

## 2021-10-04 NOTE — Telephone Encounter (Signed)
Cierra B Calling from [employer] is requesting clinical information on telmisartan-hydrochlorothiazide (MICARDIS HCT) 80-25 MG tablet [[id number]]  Please advise CB- [place]  Refer [id number]

## 2021-10-04 NOTE — Telephone Encounter (Signed)
Returned call, information updated. PA pending appeal.

## 2021-10-21 NOTE — Progress Notes (Signed)
Name: Angel Chase   MRN: [id number]    DOB: 28-Nov-1965   Date:10/22/2021       Progress Note  Subjective  Chief Complaint  Follow Up  HPI  Iron deficiency anemia due macroscopic hematuria: symptoms started over 20 years ago. He has seen  Urologist, hematologist and Nephrologist in the past . Last labs was good, normal HCT. He still has hematuria daily No fever or chills or dysuria . Last CBC was normal. Recent CT below    IMPRESSION:03/02/2021   1. No hydronephrosis.  No renal, ureteral or bladder calculi. 2. Bilateral renal cysts.  No solid enhancing renal mass. 3. Cholelithiasis without findings of acute cholecystitis. 4. Left-sided colonic diverticulosis without findings of acute diverticulitis. 5. Moderate-sized fat containing ventral hernia.   HTN: he is taking medication, denies side effects, bp is at goal today. No chest pain, dizziness or palpitation . Continue current dose of medication    Insulin resistance/morbid obesity: he denies polyphagia, polydipsia or polyuria , he does not want to start medications. 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 - explained it is not a contra-indication for driving    Hyperlipidemia:he is back on Crestor 5 mg, last LDL showed improvement of LDL but still not at goal, we will recheck labs today   The 10-year ASCVD risk score (Arnett DK, et al., 2019) is: 8.9%   Values used to calculate the score:     Age: 56 years     Sex: Male     Is Non-Hispanic African American: Yes     Diabetic: No     Tobacco smoker: No     Systolic Blood Pressure: 122 mmHg     Is BP treated: Yes     HDL Cholesterol: 48 mg/dL     Total Cholesterol: 144 mg/dL    ED: long history , took Viagra in the past, currently on Cialis 20 mg but it causes joint pains and would like to go back on Viagra    Vitamin D deficiency: he is taking vitamin D 2000 units daily , we will recheck labs    Umbilical hernia: no problems at this time,he was seen by surgeon but must lose weight prior to having surgery , weight is down 5 lbs, but likely not enough at this time  Cholelithiasis: discussed symptoms and when to [facility] to [facility]   Patient Active Problem List   Diagnosis Date Noted   Ventral hernia without obstruction or gangrene 03/26/2021   Calculus of gallbladder without cholecystitis without obstruction 03/26/2021   Umbilical hernia without obstruction and without gangrene 10/01/2020   Insulin resistance 07/26/2018   Recurrent hematuria 03/29/2018   Iron deficiency anemia due to chronic blood loss 03/29/2018   Pure hypercholesterolemia 03/29/2018   History of kidney stones 05/17/2017   Abnormal ECG 05/30/2016   History of iron deficiency anemia 04/28/2016   History of diverticulitis 03/04/2016   Hyperlipidemia 01/27/2015   Hematuria, gross 07/10/2014   Failure of erection 07/10/2014   Hypertension, benign 07/10/2014   Morbid obesity (HCC) 10/09/2013   Vitamin D deficiency 10/09/2013    Past Surgical History:  Procedure Laterality Date   ANKLE SURGERY Left 03/07/2011   fracture from trauma   COLONOSCOPY WITH PROPOFOL N/A 07/01/2016   Procedure: COLONOSCOPY WITH PROPOFOL;  Surgeon: Wyline Mood, MD;  Location: [facility] ENDOSCOPY;  Service: Endoscopy;  Laterality: N/A;   CYSTOSCOPY W/ RETROGRADES Bilateral 06/01/2016  Procedure: CYSTOSCOPY WITH RETROGRADE PYELOGRAM;  Surgeon: Hildred Laser, MD;  Location: [facility] ORS;  Service: Urology;  Laterality: Bilateral;   CYSTOSCOPY WITH STENT PLACEMENT Left 06/01/2016   Procedure: CYSTOSCOPY WITH STENT PLACEMENT;  Surgeon: Hildred Laser, MD;  Location: [facility] ORS;  Service: Urology;  Laterality: Left;   ESOPHAGOGASTRODUODENOSCOPY (EGD) WITH PROPOFOL N/A 07/01/2016   Procedure: ESOPHAGOGASTRODUODENOSCOPY (EGD) WITH PROPOFOL;  Surgeon: Wyline Mood, MD;  Location: [facility] ENDOSCOPY;  Service: Endoscopy;  Laterality: N/A;   FLEXIBLE  SIGMOIDOSCOPY N/A 02/24/2017   Procedure: FLEXIBLE SIGMOIDOSCOPY;  Surgeon: Wyline Mood, MD;  Location: [facility] ENDOSCOPY;  Service: Gastroenterology;  Laterality: N/A;   IR RADIOLOGIST EVAL & MGMT  06/16/2016   IR RENAL SELECTIVE  UNI INC S&I MOD SED  07/28/2016   IR US GUIDE VASC ACCESS LEFT  07/28/2016   STONE EXTRACTION WITH BASKET Left 06/01/2016   Procedure: STONE EXTRACTION WITH BASKET;  Surgeon: Hildred Laser, MD;  Location: [facility] ORS;  Service: Urology;  Laterality: Left;   TONSILLECTOMY     TONSILLECTOMY AND ADENOIDECTOMY  age 21   URETEROSCOPY Left 06/01/2016   Procedure: URETEROSCOPY;  Surgeon: Hildred Laser, MD;  Location: [facility] ORS;  Service: Urology;  Laterality: Left;    Family History  Problem Relation Age of Onset   Breast cancer Mother    Hypertension Mother    Diabetes Father    Hypertension Father    Hypertension Sister    Pancreatic disease Sister    Diabetes Sister    Hypertension Sister    Hypertension Brother    Diverticulosis Brother    Prostate cancer Maternal Uncle    Prostate cancer Maternal Uncle    Dementia Maternal Grandmother    Leukemia Maternal Grandfather    Heart disease Paternal Grandmother    Stomach cancer Paternal Grandfather    Kidney cancer Neg Hx    Bladder Cancer Neg Hx     Social History   Tobacco Use   Smoking status: Never   Smokeless tobacco: Never  Substance Use Topics   Alcohol use: No    Alcohol/week: 0.0 standard drinks of alcohol     Current Outpatient Medications:    Cholecalciferol (VITAMIN D) 50 MCG (2000 UT) CAPS, Take 1 capsule (2,000 Units total) by mouth daily., Disp: 30 capsule, Rfl: 11   ferrous sulfate 325 (65 FE) MG EC tablet, Take 325 mg by mouth daily with breakfast. (0600 & 1530), Disp: , Rfl:    Multiple Vitamin (MULTIVITAMIN WITH MINERALS) TABS tablet, Take 1 tablet by mouth daily at 6 (six) AM., Disp: , Rfl:    rosuvastatin (CRESTOR) 5 MG tablet, TAKE 1 TABLET(5 MG) BY MOUTH DAILY, Disp: 90 tablet,  Rfl: 0   tadalafil (CIALIS) 20 MG tablet, Take 1 tablet (20 mg total) by mouth daily as needed for erectile dysfunction., Disp: 10 tablet, Rfl: 0   telmisartan-hydrochlorothiazide (MICARDIS HCT) 80-25 MG tablet, TAKE 1 TABLET BY MOUTH DAILY, Disp: 90 tablet, Rfl: 0  No Known Allergies  I personally reviewed active problem list, medication list, allergies, family history, social history, health maintenance with the patient/caregiver today.   ROS  Constitutional: Negative for fever or weight change.  Respiratory: Negative for cough and shortness of breath.   Cardiovascular: Negative for chest pain or palpitations.  Gastrointestinal: Negative for abdominal pain, no bowel changes.  Musculoskeletal: Negative for gait problem or joint swelling.  Skin: Negative for rash.  Neurological: Negative for dizziness or headache.  No other specific complaints in a complete  review of systems (except as listed in HPI above).   Objective  Vitals:   10/22/21 0941  BP: 122/78  Pulse: 91  Resp: 16  SpO2: 98%  Weight: (!) 329 lb (149.2 kg)  Height: 5\' 11"  (1.803 m)    Body mass index is 45.89 kg/m.  Physical Exam  Constitutional: Patient appears well-developed and well-nourished. Obese  No distress.  HEENT: head atraumatic, normocephalic, pupils equal and reactive to light, neck supple, throat within normal limits Cardiovascular: Normal rate, regular rhythm and normal heart sounds.  No murmur heard. No BLE edema. Pulmonary/Chest: Effort normal and breath sounds normal. No respiratory distress. Abdominal: Soft.  There is no tenderness. Psychiatric: Patient has a normal mood and affect. behavior is normal. Judgment and thought content normal.    PHQ2/9:    10/22/2021    9:41 AM 03/26/2021    9:34 AM 09/25/2020    9:12 AM 03/20/2020    9:01 AM 09/20/2019    9:18 AM  Depression screen PHQ 2/9  Decreased Interest 0 0 0 0 0  Down, Depressed, Hopeless 0 0 0 0 0  PHQ - 2 Score 0 0 0 0 0  Altered  sleeping 0 0   0  Tired, decreased energy 0 0   0  Change in appetite 0 0   0  Feeling bad or failure about yourself  0 0   0  Trouble concentrating 0 0   0  Moving slowly or fidgety/restless 0 0   0  Suicidal thoughts 0 0   0  PHQ-9 Score 0 0   0  Difficult doing work/chores  Not difficult at all       phq 9 is negative   Fall Risk:    10/22/2021    9:41 AM 03/26/2021    9:34 AM 09/25/2020    9:12 AM 03/20/2020    9:00 AM 09/20/2019    9:18 AM  Fall Risk   Falls in the past year? 0 0 1 0 0  Number falls in past yr: 0 0 1 0 1  Injury with Fall? 0 0 0 0 0  Risk for fall due to : No Fall Risks No Fall Risks No Fall Risks  History of fall(s)  Follow up Falls prevention discussed Falls prevention discussed Falls prevention discussed        Functional Status Survey: Is the patient deaf or have difficulty hearing?: No Does the patient have difficulty seeing, even when wearing glasses/contacts?: No Does the patient have difficulty concentrating, remembering, or making decisions?: No Does the patient have difficulty walking or climbing stairs?: No Does the patient have difficulty dressing or bathing?: No Does the patient have difficulty doing errands alone such as visiting a doctor's office or shopping?: No    Assessment & Plan  1. Morbid obesity ([place])  Discussed with the patient the risk posed by an increased BMI. Discussed importance of portion control, calorie counting and at least 150 minutes of physical activity weekly. Avoid sweet beverages and drink more water. Eat at least 6 servings of fruit and vegetables daily    2. Vitamin D deficiency  - VITAMIN D 25 Hydroxy (Vit-D Deficiency, Fractures)  3. Essential hypertension  - CBC with Differential/Platelet - COMPLETE METABOLIC PANEL WITH GFR - telmisartan-hydrochlorothiazide (MICARDIS HCT) 80-25 MG tablet; Take 1 tablet by mouth daily.  Dispense: 90 tablet; Refill: 1  4. Recurrent hematuria   5. Need for immunization  against influenza  - Flu Vaccine QUAD 6+  mos PF IM (Fluarix Quad PF)  6. Pure hypercholesterolemia  - Lipid panel - rosuvastatin (CRESTOR) 5 MG tablet; Take 1 tablet (5 mg total) by mouth daily.  Dispense: 90 tablet; Refill: 1  7. Insulin resistance  - Hemoglobin A1c - metFORMIN (GLUCOPHAGE-XR) 500 MG 24 hr tablet; Take 1 tablet (500 mg total) by mouth daily with breakfast.  Dispense: 90 tablet; Refill: 1  8. Ventral hernia without obstruction or gangrene   9. Calculus of gallbladder without cholecystitis without obstruction   10. History of iron deficiency anemia  - CBC with Differential/Platelet  11. Umbilical hernia without obstruction or gangrene   12. Prostate cancer screening  - PSA  13. Family history of prostate cancer   14. ED (erectile dysfunction) of organic origin  - sildenafil (VIAGRA) 100 MG tablet; Take 0.5-1 tablets (50-100 mg total) by mouth daily as needed for erectile dysfunction.  Dispense: 30 tablet; Refill: 1

## 2021-10-22 ENCOUNTER — Ambulatory Visit: Payer: BC Managed Care – PPO | Admitting: Family Medicine

## 2021-10-22 ENCOUNTER — Encounter: Payer: Self-pay | Admitting: Family Medicine

## 2021-10-22 DIAGNOSIS — E88819 Insulin resistance, unspecified: Secondary | ICD-10-CM | POA: Diagnosis not present

## 2021-10-22 DIAGNOSIS — Z125 Encounter for screening for malignant neoplasm of prostate: Secondary | ICD-10-CM | POA: Diagnosis not present

## 2021-10-22 DIAGNOSIS — K429 Umbilical hernia without obstruction or gangrene: Secondary | ICD-10-CM

## 2021-10-22 DIAGNOSIS — K439 Ventral hernia without obstruction or gangrene: Secondary | ICD-10-CM

## 2021-10-22 DIAGNOSIS — E559 Vitamin D deficiency, unspecified: Secondary | ICD-10-CM

## 2021-10-22 DIAGNOSIS — N029 Recurrent and persistent hematuria with unspecified morphologic changes: Secondary | ICD-10-CM

## 2021-10-22 DIAGNOSIS — E7800 Pure hypercholesterolemia, unspecified: Secondary | ICD-10-CM | POA: Diagnosis not present

## 2021-10-22 DIAGNOSIS — Z862 Personal history of diseases of the blood and blood-forming organs and certain disorders involving the immune mechanism: Secondary | ICD-10-CM

## 2021-10-22 DIAGNOSIS — Z8042 Family history of malignant neoplasm of prostate: Secondary | ICD-10-CM

## 2021-10-22 DIAGNOSIS — I10 Essential (primary) hypertension: Secondary | ICD-10-CM | POA: Diagnosis not present

## 2021-10-22 DIAGNOSIS — N529 Male erectile dysfunction, unspecified: Secondary | ICD-10-CM

## 2021-10-22 DIAGNOSIS — Z23 Encounter for immunization: Secondary | ICD-10-CM | POA: Diagnosis not present

## 2021-10-22 DIAGNOSIS — K8020 Calculus of gallbladder without cholecystitis without obstruction: Secondary | ICD-10-CM

## 2021-10-22 MED ORDER — SILDENAFIL CITRATE 100 MG PO TABS
50.0000 mg | ORAL_TABLET | Freq: Every day | ORAL | 1 refills | Status: AC | PRN
Start: 2021-10-22 — End: ?

## 2021-10-22 MED ORDER — TELMISARTAN-HCTZ 80-25 MG PO TABS: 1.0000 | ORAL_TABLET | Freq: Every day | ORAL | 1 refills | Status: DC

## 2021-10-22 MED ORDER — METFORMIN HCL ER 500 MG PO TB24: 500.0000 mg | ORAL_TABLET | Freq: Every day | ORAL | 1 refills | Status: DC

## 2021-10-22 MED ORDER — ROSUVASTATIN CALCIUM 5 MG PO TABS: 5.0000 mg | ORAL_TABLET | Freq: Every day | ORAL | 1 refills | Status: DC

## 2021-10-23 LAB — COMPLETE METABOLIC PANEL WITH GFR
AG Ratio: 1.6 (calc) (ref 1.0–2.5)
ALT: 21 U/L (ref 9–46)
AST: 17 U/L (ref 10–35)
Albumin: 4.2 g/dL (ref 3.6–5.1)
Alkaline phosphatase (APISO): 69 U/L (ref 35–144)
BUN: 12 mg/dL (ref 7–25)
CO2: 28 mmol/L (ref 20–32)
Calcium: 9.8 mg/dL (ref 8.6–10.3)
Chloride: 102 mmol/L (ref 98–110)
Creat: 1.01 mg/dL (ref 0.70–1.30)
Globulin: 2.7 g/dL (calc) (ref 1.9–3.7)
Glucose, Bld: 91 mg/dL (ref 65–99)
Potassium: 4.1 mmol/L (ref 3.5–5.3)
Sodium: 140 mmol/L (ref 135–146)
Total Bilirubin: 0.4 mg/dL (ref 0.2–1.2)
Total Protein: 6.9 g/dL (ref 6.1–8.1)
eGFR: 88 mL/min/{1.73_m2} (ref >=60)

## 2021-10-23 LAB — CBC WITH DIFFERENTIAL/PLATELET
Absolute Monocytes: 764 cells/uL (ref 200–950)
Basophils Absolute: 74 cells/uL (ref 0–200)
Basophils Relative: 1.1 %
Eosinophils Absolute: 60 cells/uL (ref 15–500)
Eosinophils Relative: 0.9 %
HCT: 44 % (ref 38.5–50.0)
Hemoglobin: 14.6 g/dL (ref 13.2–17.1)
Lymphs Abs: 1340 cells/uL (ref 850–3900)
MCH: 28.3 pg (ref 27.0–33.0)
MCHC: 33.2 g/dL (ref 32.0–36.0)
MCV: 85.4 fL (ref 80.0–100.0)
MPV: 10.4 fL (ref 7.5–12.5)
Monocytes Relative: 11.4 %
Neutro Abs: 4462 cells/uL (ref 1500–7800)
Neutrophils Relative %: 66.6 %
Platelets: 210 10*3/uL (ref 140–400)
RBC: 5.15 10*6/uL (ref 4.20–5.80)
RDW: 13.7 % (ref 11.0–15.0)
Total Lymphocyte: 20 %
WBC: 6.7 10*3/uL (ref 3.8–10.8)

## 2021-10-23 LAB — PSA: PSA: 0.62 ng/mL (ref <=4.00)

## 2021-10-23 LAB — VITAMIN D 25 HYDROXY (VIT D DEFICIENCY, FRACTURES): Vit D, 25-Hydroxy: 35 ng/mL (ref 30–100)

## 2021-10-23 LAB — LIPID PANEL
Cholesterol: 142 mg/dL (ref <200)
HDL: 50 mg/dL (ref >=40)
LDL Cholesterol (Calc): 76 mg/dL (calc)
Non-HDL Cholesterol (Calc): 92 mg/dL (calc) (ref <130)
Total CHOL/HDL Ratio: 2.8 (calc) (ref <5.0)
Triglycerides: 75 mg/dL (ref <150)

## 2021-10-23 LAB — HEMOGLOBIN A1C
Hgb A1c MFr Bld: 5.8 % of total Hgb — ABNORMAL HIGH (ref <5.7)
Mean Plasma Glucose: 120 mg/dL
eAG (mmol/L): 6.6 mmol/L

## 2021-10-26 ENCOUNTER — Ambulatory Visit: Payer: BC Managed Care – PPO | Admitting: Surgery

## 2021-11-01 ENCOUNTER — Telehealth: Payer: Self-pay | Admitting: Family Medicine

## 2021-11-01 ENCOUNTER — Other Ambulatory Visit: Payer: Self-pay | Admitting: Family Medicine

## 2021-11-01 MED ORDER — OLMESARTAN MEDOXOMIL-HCTZ 40-25 MG PO TABS: 1.0000 | ORAL_TABLET | Freq: Every day | ORAL | 1 refills | Status: DC

## 2021-11-01 NOTE — Telephone Encounter (Unsigned)
Copied from [place] [phone]. Topic: General - Other >> Nov 01, 2021  2:36 PM Everette C wrote: Reason for CRM: The patient has been directed by their Pharmacy to contact their PCP and request an alternative prescription for telmisartan-hydrochlorothiazide (MICARDIS HCT) 80-25 MG tablet [[id number]]   The patient has been provided with roughly 5 alternative recommendations and they would like to discuss this further with a member of staff   The patient has been told that their insurance will not cover the cost of the medication   Please contact the patient further when possible

## 2022-01-20 NOTE — Progress Notes (Signed)
Name: Angel Chase   MRN: [id number]    DOB: [birth year]   Date:01/21/2022       Progress Note  Subjective  Chief Complaint  Annual Exam  HPI  Patient presents for annual CPE.  IPSS Questionnaire (AUA-7): Over the past month.   1)  How often have you had a sensation of not emptying your bladder completely after you finish urinating?  0 - Not at all  2)  How often have you had to urinate again less than two hours after you finished urinating? 0 - Not at all  3)  How often have you found you stopped and started again several times when you urinated?  0 - Not at all  4) How difficult have you found it to postpone urination?  0 - Not at all  5) How often have you had a weak urinary stream?  0 - Not at all  6) How often have you had to push or strain to begin urination?  0 - Not at all  7) How many times did you most typically get up to urinate from the time you went to bed until the time you got up in the morning?  1 - 1 time  Total score:  0-7 mildly symptomatic   8-19 moderately symptomatic   20-35 severely symptomatic     Diet: he states he will cut down on sweet tea , they eat home made food, eats out on weekends and eats fast food about once a week  Exercise: he needs to exercise 150 minutes per week  Last Dental Exam: up to date  Last Eye Exam: yearly   Depression: phq 9 is negative    01/21/2022    8:18 AM 10/22/2021    9:41 AM 03/26/2021    9:34 AM 09/25/2020    9:12 AM 03/20/2020    9:01 AM  Depression screen PHQ 2/9  Decreased Interest 0 0 0 0 0  Down, Depressed, Hopeless 0 0 0 0 0  PHQ - 2 Score 0 0 0 0 0  Altered sleeping 0 0 0    Tired, decreased energy 0 0 0    Change in appetite 0 0 0    Feeling bad or failure about yourself  0 0 0    Trouble concentrating 0 0 0    Moving slowly or fidgety/restless 0 0 0    Suicidal thoughts 0 0 0    PHQ-9 Score 0 0 0    Difficult doing work/chores   Not difficult at all      Hypertension:  BP Readings from Last 3 Encounters:   01/21/22 126/74  10/22/21 122/78  04/27/21 138/87    Obesity: Wt Readings from Last 3 Encounters:  01/21/22 (!) 327 lb (148.3 kg)  10/22/21 (!) 329 lb (149.2 kg)  04/27/21 (!) 333 lb 9.6 oz (151.3 kg)   BMI Readings from Last 3 Encounters:  01/21/22 45.61 kg/m  10/22/21 45.89 kg/m  04/27/21 46.53 kg/m     Lipids:  Lab Results  Component Value Date   CHOL 142 10/22/2021   CHOL 144 09/25/2020   CHOL 196 03/20/2020   Lab Results  Component Value Date   HDL 50 10/22/2021   HDL 48 09/25/2020   HDL 45 03/20/2020   Lab Results  Component Value Date   LDLCALC 76 10/22/2021   LDLCALC 78 09/25/2020   LDLCALC 132 (H) 03/20/2020   Lab Results  Component Value Date   TRIG 75 10/22/2021  TRIG 93 09/25/2020   TRIG 88 03/20/2020   Lab Results  Component Value Date   CHOLHDL 2.8 10/22/2021   CHOLHDL 3.0 09/25/2020   CHOLHDL 4.4 03/20/2020   No results found for: "LDLDIRECT" Glucose:  Glucose  Date Value Ref Range Status  03/04/2011 88 65 - 99 mg/dL Final   Glucose, Bld  Date Value Ref Range Status  10/22/2021 91 65 - 99 mg/dL Final    Comment:    .            Fasting reference interval .   09/25/2020 85 65 - 99 mg/dL Final    Comment:    .            Fasting reference interval .   03/20/2020 90 65 - 99 mg/dL Final    Comment:    .            Fasting reference interval .     [place] [facility]/2020 [facility]  AUDIT-C Score 0       Married STD testing and prevention (HIV/chl/gon/syphilis): 09/20/19 Sexual history: one partner - married - taking Viagra Hep C Screening: 07/26/18 Skin cancer: Discussed monitoring for atypical lesions Colorectal cancer: 07/01/16 Prostate cancer:   Lab Results  Component Value Date   PSA 0.62 10/22/2021   PSA 0.55 09/25/2020   PSA 0.5 07/26/2018     Lung cancer:  Low Dose CT Chest recommended if Age 50-80 years, 30 pack-year currently smoking OR have quit w/in 15years.  Patient  no a candidate for screening   AAA: The USPSTF recommends one-time screening with ultrasonography in men ages 64 to 68 years who have ever smoked. Patient   no, a candidate for screening  ECG:  05/24/16  Vaccines:   Tdap: up to date Shingrix: up to date Pneumonia: N/A Flu: up to date COVID-22: discussed booste r  Advanced Care Planning: A voluntary discussion about advance care planning including the explanation and discussion of advance directives.  Discussed health care proxy and Living will, and the patient was able to identify a health care proxy as wife.  Patient does not have a living will and power of attorney of health care   Patient Active Problem List   Diagnosis Date Noted   Ventral hernia without obstruction or gangrene 03/26/2021   Calculus of gallbladder without cholecystitis without obstruction 99991111   Umbilical hernia without obstruction and without gangrene 10/01/2020   Insulin resistance 07/26/2018   Recurrent hematuria 03/29/2018   Iron deficiency anemia due to chronic blood loss 03/29/2018   Pure hypercholesterolemia 03/29/2018   History of kidney stones 05/17/2017   Abnormal ECG 05/30/2016   History of iron deficiency anemia 04/28/2016   History of diverticulitis 03/04/2016   Hyperlipidemia 01/27/2015   Hematuria, gross 07/10/2014   Failure of erection 07/10/2014   Hypertension, benign 07/10/2014   Morbid obesity ([place]) 10/09/2013   Vitamin D deficiency 10/09/2013    Past Surgical History:  Procedure Laterality Date   ANKLE SURGERY Left 03/07/2011   fracture from trauma   COLONOSCOPY WITH PROPOFOL N/A 07/01/2016   Procedure: COLONOSCOPY WITH PROPOFOL;  Surgeon: Jonathon Bellows, MD;  Location: [facility] ENDOSCOPY;  Service: Endoscopy;  Laterality: N/A;   CYSTOSCOPY W/ RETROGRADES Bilateral 06/01/2016   Procedure: CYSTOSCOPY WITH RETROGRADE PYELOGRAM;  Surgeon: Nickie Retort, MD;  Location: [facility] ORS;  Service: Urology;  Laterality: Bilateral;    CYSTOSCOPY WITH STENT PLACEMENT Left 06/01/2016   Procedure: CYSTOSCOPY WITH  STENT PLACEMENT;  Surgeon: Nickie Retort, MD;  Location: [facility] ORS;  Service: Urology;  Laterality: Left;   ESOPHAGOGASTRODUODENOSCOPY (EGD) WITH PROPOFOL N/A 07/01/2016   Procedure: ESOPHAGOGASTRODUODENOSCOPY (EGD) WITH PROPOFOL;  Surgeon: Jonathon Bellows, MD;  Location: [facility] ENDOSCOPY;  Service: Endoscopy;  Laterality: N/A;   FLEXIBLE SIGMOIDOSCOPY N/A 02/24/2017   Procedure: FLEXIBLE SIGMOIDOSCOPY;  Surgeon: Jonathon Bellows, MD;  Location: [facility] ENDOSCOPY;  Service: Gastroenterology;  Laterality: N/A;   IR RADIOLOGIST EVAL & MGMT  06/16/2016   IR RENAL SELECTIVE  UNI INC S&I MOD SED  07/28/2016   IR US GUIDE VASC ACCESS LEFT  07/28/2016   STONE EXTRACTION WITH BASKET Left 06/01/2016   Procedure: STONE EXTRACTION WITH BASKET;  Surgeon: Nickie Retort, MD;  Location: [facility] ORS;  Service: Urology;  Laterality: Left;   TONSILLECTOMY     TONSILLECTOMY AND ADENOIDECTOMY  age 62   URETEROSCOPY Left 06/01/2016   Procedure: URETEROSCOPY;  Surgeon: Nickie Retort, MD;  Location: [facility] ORS;  Service: Urology;  Laterality: Left;    Family History  Problem Relation Age of Onset   Breast cancer Mother    Hypertension Mother    Diabetes Father    Hypertension Father    Hypertension Sister    Pancreatic disease Sister    Diabetes Sister    Hypertension Sister    Hypertension Brother    Diverticulosis Brother    Prostate cancer Maternal Uncle    Prostate cancer Maternal Uncle    Dementia Maternal Grandmother    Leukemia Maternal Grandfather    Heart disease Paternal Grandmother    Stomach cancer Paternal Grandfather    Kidney cancer Neg Hx    Bladder Cancer Neg Hx     Social History   Socioeconomic History   Marital status: Married    Spouse name: Sherrie   Number of children: 1   Years of education: Not on file   Highest education level: 12th grade  Occupational History   Occupation: Recruitment consultant   Tobacco Use   Smoking status: Never   Smokeless tobacco: Never  Vaping Use   Vaping Use: Never used  Substance and Sexual Activity   Alcohol use: No    Alcohol/week: 0.0 standard drinks of alcohol   Drug use: No   Sexual activity: Yes    Partners: Female    Comment: wife hysterectomy   Other Topics Concern   Not on file  Social History Narrative   Not on file   Social Determinants of Health   Financial Resource Strain: Low Risk  (01/21/2022)   Overall Financial Resource Strain (CARDIA)    Difficulty of Paying Living Expenses: Not hard at all  Food Insecurity: No Food Insecurity (01/21/2022)   Hunger Vital Sign    Worried About Running Out of Food in the Last Year: Never true    Ran Out of Food in the Last Year: Never true  Transportation Needs: No Transportation Needs (01/21/2022)   PRAPARE - Hydrologist (Medical): No    Lack of Transportation (Non-Medical): No  Physical Activity: Inactive (01/21/2022)   Exercise Vital Sign    Days of Exercise per Week: 0 days    Minutes of Exercise per Session: 0 min  Stress: No Stress Concern Present (01/21/2022)   [place]    Feeling of Stress : Not at all  Social Connections: Moderately Integrated (01/21/2022)   Social Connection and Isolation Panel [NHANES]  Frequency of Communication with Friends and Family: More than three times a week    Frequency of Social Gatherings with Friends and Family: Twice a week    Attends Religious Services: More than 4 times per year    Active Member of [employer] or Organizations: No    Attends Archivist Meetings: Never    Marital Status: Married  Human resources officer Violence: Not At Risk (01/21/2022)   Humiliation, Afraid, Rape, and Kick questionnaire    Fear of Current or Ex-Partner: No    Emotionally Abused: No    Physically Abused: No    Sexually Abused: No     Current Outpatient Medications:     Cholecalciferol (VITAMIN D) 50 MCG (2000 UT) CAPS, Take 1 capsule (2,000 Units total) by mouth daily., Disp: 30 capsule, Rfl: 11   ferrous sulfate 325 (65 FE) MG EC tablet, Take 325 mg by mouth daily with breakfast. (0600 & 1530), Disp: , Rfl:    Multiple Vitamin (MULTIVITAMIN WITH MINERALS) TABS tablet, Take 1 tablet by mouth daily at 6 (six) AM., Disp: , Rfl:    olmesartan-hydrochlorothiazide (BENICAR HCT) 40-25 MG tablet, Take 1 tablet by mouth daily. In place of micardis hctz due to insurance, Disp: 90 tablet, Rfl: 1   rosuvastatin (CRESTOR) 5 MG tablet, Take 1 tablet (5 mg total) by mouth daily., Disp: 90 tablet, Rfl: 1   sildenafil (VIAGRA) 100 MG tablet, Take 0.5-1 tablets (50-100 mg total) by mouth daily as needed for erectile dysfunction., Disp: 30 tablet, Rfl: 1   metFORMIN (GLUCOPHAGE-XR) 500 MG 24 hr tablet, Take 1 tablet (500 mg total) by mouth daily with breakfast. (Patient not taking: Reported on 01/21/2022), Disp: 90 tablet, Rfl: 1  Allergies  Allergen Reactions   Cialis [Tadalafil] Other (See Comments)    Joint aches      ROS  Constitutional: Negative for fever or weight change.  Respiratory: Negative for cough and shortness of breath.   Cardiovascular: Negative for chest pain or palpitations.  Gastrointestinal: Negative for abdominal pain, no bowel changes.  Musculoskeletal: Negative for gait problem or joint swelling.  Skin: Negative for rash.  Neurological: Negative for dizziness or headache.  No other specific complaints in a complete review of systems (except as listed in HPI above).    Objective  Vitals:   01/21/22 0819  BP: 126/74  Pulse: 94  Resp: 16  SpO2: 97%  Weight: (!) 327 lb (148.3 kg)  Height: 5\' 11"  (1.803 m)    Body mass index is 45.61 kg/m.  Physical Exam  Constitutional: Patient appears well-developed and well-nourished. Obese No distress.  HENT: Head: Normocephalic and atraumatic. Ears: B TMs ok, no erythema or effusion; Nose: Nose  normal. Mouth/Throat: Oropharynx is clear and moist. No oropharyngeal exudate.  Eyes: Conjunctivae and EOM are normal. Pupils are equal, round, and reactive to light. No scleral icterus.  Neck: Normal range of motion. Neck supple. No JVD present. No thyromegaly present.  Cardiovascular: Normal rate, regular rhythm and normal heart sounds.  No murmur heard. No BLE edema. Pulmonary/Chest: Effort normal and breath sounds normal. No respiratory distress. Abdominal: Soft. Bowel sounds are normal, no distension, mall umbilical hernia . There is no tenderness MALE GENITALIA: Normal descended testes bilaterally, no masses palpated, no hernias, no lesions, no discharge RECTAL:he asked me not to do it, family history of prostate cancer  Musculoskeletal: Normal range of motion, no joint effusions. No gross deformities Neurological: he is alert and oriented to person, place, and time. No cranial  nerve deficit. Coordination, balance, strength, speech and gait are normal.  Skin: Skin is warm and dry. No rash noted. No erythema.  Psychiatric: Patient has a normal mood and affect. behavior is normal. Judgment and thought content normal.   Fall Risk:    01/21/2022    8:18 AM 10/22/2021    9:41 AM 03/26/2021    9:34 AM 09/25/2020    9:12 AM 03/20/2020    9:00 AM  Fall Risk   Falls in the past year? 1 0 0 1 0  Number falls in past yr: 0 0 0 1 0  Injury with Fall? 0 0 0 0 0  Risk for fall due to : No Fall Risks No Fall Risks No Fall Risks No Fall Risks   Follow up Falls prevention discussed Falls prevention discussed Falls prevention discussed Falls prevention discussed      Functional Status Survey: Is the patient deaf or have difficulty hearing?: No Does the patient have difficulty seeing, even when wearing glasses/contacts?: No Does the patient have difficulty concentrating, remembering, or making decisions?: No Does the patient have difficulty walking or climbing stairs?: No Does the patient have  difficulty dressing or bathing?: No Does the patient have difficulty doing errands alone such as visiting a doctor's office or shopping?: No    Assessment & Plan  1. Well adult exam   Discussed of increasing regular physical activity   -Prostate cancer screening and PSA options (with potential risks and benefits of testing vs not testing) were discussed along with recent recs/guidelines. -USPSTF grade A and B recommendations reviewed with patient; age-appropriate recommendations, preventive care, screening tests, etc discussed and encouraged; healthy living encouraged; see AVS for patient education given to patient -Discussed importance of 150 minutes of physical activity weekly, eat two servings of fish weekly, eat one serving of tree nuts ( cashews, pistachios, pecans, almonds.Marland Kitchen) every other day, eat 6 servings of fruit/vegetables daily and drink plenty of water and avoid sweet beverages.  -Reviewed Health Maintenance: yes

## 2022-01-20 NOTE — Patient Instructions (Signed)

## 2022-01-21 ENCOUNTER — Ambulatory Visit (INDEPENDENT_AMBULATORY_CARE_PROVIDER_SITE_OTHER): Payer: BC Managed Care – PPO | Admitting: Family Medicine

## 2022-01-21 ENCOUNTER — Encounter: Payer: Self-pay | Admitting: Family Medicine

## 2022-01-21 VITALS — BP 126/74 | HR 94 | Resp 16 | Ht 71.0 in | Wt 327.0 lb

## 2022-01-21 DIAGNOSIS — Z0000 Encounter for general adult medical examination without abnormal findings: Secondary | ICD-10-CM

## 2022-03-09 ENCOUNTER — Ambulatory Visit (INDEPENDENT_AMBULATORY_CARE_PROVIDER_SITE_OTHER): Payer: BC Managed Care – PPO | Admitting: Urology

## 2022-03-09 ENCOUNTER — Ambulatory Visit: Payer: BC Managed Care – PPO | Admitting: Urology

## 2022-03-09 ENCOUNTER — Encounter: Payer: Self-pay | Admitting: Urology

## 2022-03-09 VITALS — BP 138/83 | HR 95 | Ht 71.0 in | Wt 327.0 lb

## 2022-03-09 DIAGNOSIS — R310 Gross hematuria: Secondary | ICD-10-CM | POA: Diagnosis not present

## 2022-03-09 DIAGNOSIS — Z125 Encounter for screening for malignant neoplasm of prostate: Secondary | ICD-10-CM

## 2022-03-09 NOTE — Progress Notes (Signed)
   03/09/2022 11:36 AM   Angel Chase 19-Apr-1965 [id number]  Reason for visit: Follow up chronic gross hematuria, PSA screening  HPI: Angel Chase is a 57 year old African-American male with morbid obesity(BMI 26) and hypertension that was previously followed by Dr. Pilar Jarvis for recurrent gross hematuria.  He has a complex urologic history.  To briefly summarize, he reports 30+ years of persistent pink to red urine with small clots. This has required iron supplementation however no blood transfusions.  He is completely asymptomatic and he denies any flank pain, urinary symptoms, or retention from clots.  He underwent extensive work-up previously including a negative renal biopsy in 2000 at [facility], cystoscopy(noted to have localizing left sided hematuria from left ureteral orifice), retrograde pyelograms, left diagnostic ureteroscopy in 2018 with Dr. Pilar Jarvis without any abnormal findings, and 40m renal stone removal, as well as left sided angiography with interventional radiology in July 2018 which was essentially negative.  There was a possibility of a chronic nephritis or vasculitis, but no AV malformation or active extravasation.   Renal function is normal, most recently creatinine 1.01, EGFR greater than 60 in October 2023.  PSA is normal at 0.62 and stable over the last few years.  PSA screening guidelines were reviewed today.   Interestingly, he had cessation of all gross hematuria for about a year after getting the COVID-vaccine.    I saw him last in January 2023 when he had had some increase in his gross hematuria as well as some flank pain, using shared decision making we opted for a CT urogram.  I personally viewed and interpreted that CT dated 03/02/2021 that shows no hydronephrosis, no nephrolithiasis, no masses or filling defects, and no urologic abnormalities.  With his long history(>30 years) of chronic intermittent hematuria with extensive negative workup, reassurance was provided.  Return  precautions discussed including significant increase in gross hematuria with large clots, difficulty urinating, or flank pain.  He prefers follow-up as needed which I think is reasonable.   BBilley Co MDulles Town CenterUrological [facility] [address] [place] [phone]

## 2022-04-28 NOTE — Progress Notes (Signed)
Name: Angel Chase   MRN: [id number]    DOB: Apr 11, 1965   Date:04/29/2022       Progress Note  Subjective  Chief Complaint  Follow Up  HPI  Iron deficiency anemia due macroscopic hematuria: symptoms started over 20 years ago. He has seen  Urologist, hematologist and Nephrologist in the past . Last labs was good, normal HCT. He still has hematuria daily No fever or chills or dysuria . Last CBC was normal. Last CT below    IMPRESSION:03/02/2021   1. No hydronephrosis.  No renal, ureteral or bladder calculi. 2. Bilateral renal cysts.  No solid enhancing renal mass. 3. Cholelithiasis without findings of acute cholecystitis. 4. Left-sided colonic diverticulosis without findings of acute diverticulitis. 5. Moderate-sized fat containing ventral hernia.   HTN: he is taking medication, denies side effects, bp has bee controlled  No chest pain, dizziness or palpitation    Insulin resistance/morbid obesity: he denies polyphagia, polydipsia or polyuria , he does not want to start medications. Last A1C was elevated, he states when he takes Metformin it causes hematuria so he does not want to take any medications at this time    Hyperlipidemia:he is back on Crestor 5 mg, last LDL showed improvement of LDL but still not at goal, we will recheck labs today   The 10-year ASCVD risk score (Arnett DK, et al., 2019) is: 10.1%   Values used to calculate the score:     Age: 57 years     Sex: Male     Is Non-Hispanic African American: Yes     Diabetic: No     Tobacco smoker: No     Systolic Blood Pressure: 132 mmHg     Is BP treated: Yes     HDL Cholesterol: 50 mg/dL     Total Cholesterol: 142 mg/dL   ED: long history , stable on Viagra ( Cialis causes side effects)   Vitamin D deficiency: he is taking vitamin D 2000 units daily, last level was at goal   Umbilical hernia: no problems at this time,he was seen by surgeon but must lose weight prior to having surgery , weight is down 5 lbs, but  likely not enough at this time  Cholelithiasis: reminded him about symptoms of strangulation  and [facility]    Left plantar fascitis:symptoms of left heel pain started a few months ago,  he is doing stretches exercises and doing better. He does not want to see a podiatrist at this time.   Patient Active Problem List   Diagnosis Date Noted   Ventral hernia without obstruction or gangrene 03/26/2021   Calculus of gallbladder without cholecystitis without obstruction 03/26/2021   Umbilical hernia without obstruction and without gangrene 10/01/2020   Insulin resistance 07/26/2018   Recurrent hematuria 03/29/2018   Iron deficiency anemia due to chronic blood loss 03/29/2018   Pure hypercholesterolemia 03/29/2018   History of kidney stones 05/17/2017   Abnormal ECG 05/30/2016   History of iron deficiency anemia 04/28/2016   History of diverticulitis 03/04/2016   Hyperlipidemia 01/27/2015   Hematuria, gross 07/10/2014   Failure of erection 07/10/2014   Hypertension, benign 07/10/2014   Morbid obesity (HCC) 10/09/2013   Vitamin D deficiency 10/09/2013    Past Surgical History:  Procedure Laterality Date   ANKLE SURGERY Left 03/07/2011   fracture from trauma   COLONOSCOPY WITH PROPOFOL N/A 07/01/2016   Procedure: COLONOSCOPY WITH PROPOFOL;  Surgeon: Wyline MoodAnna, Kiran, MD;  [facility];  Service:  Endoscopy;  Laterality: N/A;   CYSTOSCOPY W/ RETROGRADES Bilateral 06/01/2016   Procedure: CYSTOSCOPY WITH RETROGRADE PYELOGRAM;  Surgeon: Hildred Laser, MD;  Location: [facility] ORS;  Service: Urology;  Laterality: Bilateral;   CYSTOSCOPY WITH STENT PLACEMENT Left 06/01/2016   Procedure: CYSTOSCOPY WITH STENT PLACEMENT;  Surgeon: Hildred Laser, MD;  Location: [facility] ORS;  Service: Urology;  Laterality: Left;   ESOPHAGOGASTRODUODENOSCOPY (EGD) WITH PROPOFOL N/A 07/01/2016   Procedure: ESOPHAGOGASTRODUODENOSCOPY (EGD) WITH PROPOFOL;  Surgeon: Wyline Mood, MD;  Location[facility]  ENDOSCOPY;  Service: Endoscopy;  Laterality: N/A;   FLEXIBLE SIGMOIDOSCOPY N/A 02/24/2017   Procedure: FLEXIBLE SIGMOIDOSCOPY;  Surgeon: Wyline Mood, MD;  Location: [facility] ENDOSCOPY;  Service: Gastroenterology;  Laterality: N/A;   IR RADIOLOGIST EVAL & MGMT  06/16/2016   IR RENAL SELECTIVE  UNI INC S&I MOD SED  07/28/2016   IR US GUIDE VASC ACCESS LEFT  07/28/2016   STONE EXTRACTION WITH BASKET Left 06/01/2016   Procedure: STONE EXTRACTION WITH BASKET;  Surgeon: Hildred Laser, MD;  Location: [facility] ORS;  Service: Urology;  Laterality: Left;   TONSILLECTOMY     TONSILLECTOMY AND ADENOIDECTOMY  age 57   URETEROSCOPY Left 06/01/2016   Procedure: URETEROSCOPY;  Surgeon: Hildred Laser, MD;  Location: [facility] ORS;  Service: Urology;  Laterality: Left;    Family History  Problem Relation Age of Onset   Breast cancer Mother    Hypertension Mother    Diabetes Father    Hypertension Father    Hypertension Sister    Pancreatic disease Sister    Diabetes Sister    Hypertension Sister    Hypertension Brother    Diverticulosis Brother    Prostate cancer Maternal Uncle    Prostate cancer Maternal Uncle    Dementia Maternal Grandmother    Leukemia Maternal Grandfather    Heart disease Paternal Grandmother    Stomach cancer Paternal Grandfather    Kidney cancer Neg Hx    Bladder Cancer Neg Hx     Social History   Tobacco Use   Smoking status: Never   Smokeless tobacco: Never  Substance Use Topics   Alcohol use: No    Alcohol/week: 0.0 standard drinks of alcohol     Current Outpatient Medications:    Cholecalciferol (VITAMIN D) 50 MCG (2000 UT) CAPS, Take 1 capsule (2,000 Units total) by mouth daily., Disp: 30 capsule, Rfl: 11   ferrous sulfate 325 (65 FE) MG EC tablet, Take 325 mg by mouth daily with breakfast. (0600 & 1530), Disp: , Rfl:    Multiple Vitamin (MULTIVITAMIN WITH MINERALS) TABS tablet, Take 1 tablet by mouth daily at 6 (six) AM., Disp: , Rfl:     olmesartan-hydrochlorothiazide (BENICAR HCT) 40-25 MG tablet, Take 1 tablet by mouth daily. In place of micardis hctz due to insurance, Disp: 90 tablet, Rfl: 1   rosuvastatin (CRESTOR) 5 MG tablet, Take 1 tablet (5 mg total) by mouth daily., Disp: 90 tablet, Rfl: 1   sildenafil (VIAGRA) 100 MG tablet, Take 0.5-1 tablets (50-100 mg total) by mouth daily as needed for erectile dysfunction., Disp: 30 tablet, Rfl: 1  Allergies  Allergen Reactions   Cialis [Tadalafil] Other (See Comments)    Joint aches     I personally reviewed active problem list, medication list, allergies, family history, social history, health maintenance with the patient/caregiver today.   ROS  Constitutional: Negative for fever , positive for weight change.  Respiratory: Negative for cough and shortness of breath.   Cardiovascular: Negative for chest  pain or palpitations.  Gastrointestinal: Negative for abdominal pain, no bowel changes.  Musculoskeletal: Negative for gait problem or joint swelling.  Skin: Negative for rash.  Neurological: Negative for dizziness or headache.  No other specific complaints in a complete review of systems (except as listed in HPI above).   Objective  Vitals:   04/29/22 1007  BP: 132/78  Pulse: 90  Resp: 18  Temp: 97.6 F (36.4 C)  TempSrc: Oral  SpO2: 96%  Weight: (!) 337 lb 8 oz (153.1 kg)  Height: 5\' 11"  (1.803 m)    Body mass index is 47.07 kg/m.  Physical Exam  Constitutional: Patient appears well-developed and well-nourished. Obese  No distress.  HEENT: head atraumatic, normocephalic, pupils equal and reactive to light, neck supple Cardiovascular: Normal rate, regular rhythm and normal heart sounds.  No murmur heard. No BLE edema. Pulmonary/Chest: Effort normal and breath sounds normal. No respiratory distress. Abdominal: Soft.  There is no tenderness. Psychiatric: Patient has a normal mood and affect. behavior is normal. Judgment and thought content normal.   PHQ2/9:    04/29/2022   10:08 AM 01/21/2022    8:18 AM 10/22/2021    9:41 AM 03/26/2021    9:34 AM 09/25/2020    9:12 AM  Depression screen PHQ 2/9  Decreased Interest 0 0 0 0 0  Down, Depressed, Hopeless 0 0 0 0 0  PHQ - 2 Score 0 0 0 0 0  Altered sleeping 0 0 0 0   Tired, decreased energy 0 0 0 0   Change in appetite 0 0 0 0   Feeling bad or failure about yourself  0 0 0 0   Trouble concentrating 0 0 0 0   Moving slowly or fidgety/restless 0 0 0 0   Suicidal thoughts 0 0 0 0   PHQ-9 Score 0 0 0 0   Difficult doing work/chores    Not difficult at all     phq 9 is negative   Fall Risk:    04/29/2022   10:08 AM 01/21/2022    8:18 AM 10/22/2021    9:41 AM 03/26/2021    9:34 AM 09/25/2020    9:12 AM  Fall Risk   Falls in the past year? 0 1 0 0 1  Number falls in past yr:  0 0 0 1  Injury with Fall?  0 0 0 0  Risk for fall due to : No Fall Risks No Fall Risks No Fall Risks No Fall Risks No Fall Risks  Follow up Falls prevention discussed Falls prevention discussed Falls prevention discussed Falls prevention discussed Falls prevention discussed      Functional Status Survey: Is the patient deaf or have difficulty hearing?: No Does the patient have difficulty seeing, even when wearing glasses/contacts?: No Does the patient have difficulty concentrating, remembering, or making decisions?: No Does the patient have difficulty walking or climbing stairs?: Yes Does the patient have difficulty dressing or bathing?: No Does the patient have difficulty doing errands alone such as visiting a doctor's office or shopping?: No    Assessment & Plan  1. Essential hypertension  - olmesartan-hydrochlorothiazide (BENICAR HCT) 40-25 MG tablet; Take 1 tablet by mouth daily. In place of micardis hctz due to insurance  Dispense: 90 tablet; Refill: 1  2. Recurrent hematuria  Under the care of urologist   3. Morbid obesity  Discussed with the patient the risk posed by an increased BMI.  Discussed importance of portion control, calorie counting and at  least 150 minutes of physical activity weekly. Avoid sweet beverages and drink more water. Eat at least 6 servings of fruit and vegetables daily    4. Vitamin D deficiency  Continue supplementation   5. Ventral hernia without obstruction or gangrene  stable  6. Insulin resistance  Does not want medication  7. Pure hypercholesterolemia  - rosuvastatin (CRESTOR) 5 MG tablet; Take 1 tablet (5 mg total) by mouth daily.  Dispense: 90 tablet; Refill: 1  8. Calculus of gallbladder without cholecystitis without obstruction   9. History of iron deficiency anemia  Taking iron  10. ED (erectile dysfunction) of organic origin    47. Plantar fasciitis, left   Discussed stretching with patient

## 2022-04-29 ENCOUNTER — Encounter: Payer: Self-pay | Admitting: Family Medicine

## 2022-04-29 ENCOUNTER — Ambulatory Visit: Payer: BC Managed Care – PPO | Admitting: Family Medicine

## 2022-04-29 VITALS — BP 132/78 | HR 90 | Temp 97.6°F | Resp 18 | Ht 71.0 in | Wt 337.5 lb

## 2022-04-29 DIAGNOSIS — E559 Vitamin D deficiency, unspecified: Secondary | ICD-10-CM

## 2022-04-29 DIAGNOSIS — K8020 Calculus of gallbladder without cholecystitis without obstruction: Secondary | ICD-10-CM

## 2022-04-29 DIAGNOSIS — E7800 Pure hypercholesterolemia, unspecified: Secondary | ICD-10-CM

## 2022-04-29 DIAGNOSIS — K439 Ventral hernia without obstruction or gangrene: Secondary | ICD-10-CM

## 2022-04-29 DIAGNOSIS — I10 Essential (primary) hypertension: Secondary | ICD-10-CM

## 2022-04-29 DIAGNOSIS — N029 Recurrent and persistent hematuria with unspecified morphologic changes: Secondary | ICD-10-CM | POA: Diagnosis not present

## 2022-04-29 DIAGNOSIS — N529 Male erectile dysfunction, unspecified: Secondary | ICD-10-CM

## 2022-04-29 DIAGNOSIS — E88819 Insulin resistance, unspecified: Secondary | ICD-10-CM

## 2022-04-29 DIAGNOSIS — Z862 Personal history of diseases of the blood and blood-forming organs and certain disorders involving the immune mechanism: Secondary | ICD-10-CM

## 2022-04-29 DIAGNOSIS — M722 Plantar fascial fibromatosis: Secondary | ICD-10-CM

## 2022-04-29 MED ORDER — OLMESARTAN MEDOXOMIL-HCTZ 40-25 MG PO TABS: 1.0000 | ORAL_TABLET | Freq: Every day | ORAL | 1 refills | Status: DC

## 2022-04-29 MED ORDER — ROSUVASTATIN CALCIUM 5 MG PO TABS: 5.0000 mg | ORAL_TABLET | Freq: Every day | ORAL | 1 refills | Status: DC

## 2022-04-29 NOTE — Patient Instructions (Signed)
OOFOS sandals

## 2022-05-31 ENCOUNTER — Telehealth: Payer: Self-pay | Admitting: Family Medicine

## 2022-05-31 NOTE — Telephone Encounter (Signed)
Copied from CRM [id number]. Topic: General - Other >> May 31, 2022  3:42 PM Patsy Lager T wrote: Reason for CRM: patient is requesting a call back to advise why he has a medication refill appt on 5/17 and he just got all his meds refilled. Please advise

## 2022-06-01 ENCOUNTER — Other Ambulatory Visit: Payer: Self-pay | Admitting: Family Medicine

## 2022-06-01 NOTE — Telephone Encounter (Signed)
Called and scheduled pt for Oct. He does wish to have a return call from a nurse. He states his BP meds were not changed in April,and that he has been on the same BP meds since the end of last year, and his BP has been running fine. Please call pt back.

## 2022-06-03 ENCOUNTER — Ambulatory Visit: Payer: BC Managed Care – PPO | Admitting: Family Medicine

## 2022-08-17 DIAGNOSIS — M9901 Segmental and somatic dysfunction of cervical region: Secondary | ICD-10-CM | POA: Diagnosis not present

## 2022-08-17 DIAGNOSIS — M9903 Segmental and somatic dysfunction of lumbar region: Secondary | ICD-10-CM | POA: Diagnosis not present

## 2022-08-17 DIAGNOSIS — M9905 Segmental and somatic dysfunction of pelvic region: Secondary | ICD-10-CM | POA: Diagnosis not present

## 2022-08-17 DIAGNOSIS — M5136 Other intervertebral disc degeneration, lumbar region: Secondary | ICD-10-CM | POA: Diagnosis not present

## 2022-08-25 DIAGNOSIS — M5136 Other intervertebral disc degeneration, lumbar region: Secondary | ICD-10-CM | POA: Diagnosis not present

## 2022-08-25 DIAGNOSIS — M9901 Segmental and somatic dysfunction of cervical region: Secondary | ICD-10-CM | POA: Diagnosis not present

## 2022-08-25 DIAGNOSIS — M9903 Segmental and somatic dysfunction of lumbar region: Secondary | ICD-10-CM | POA: Diagnosis not present

## 2022-08-25 DIAGNOSIS — M9905 Segmental and somatic dysfunction of pelvic region: Secondary | ICD-10-CM | POA: Diagnosis not present

## 2022-08-26 DIAGNOSIS — M9905 Segmental and somatic dysfunction of pelvic region: Secondary | ICD-10-CM | POA: Diagnosis not present

## 2022-08-26 DIAGNOSIS — M5136 Other intervertebral disc degeneration, lumbar region: Secondary | ICD-10-CM | POA: Diagnosis not present

## 2022-08-26 DIAGNOSIS — M9901 Segmental and somatic dysfunction of cervical region: Secondary | ICD-10-CM | POA: Diagnosis not present

## 2022-08-26 DIAGNOSIS — M9903 Segmental and somatic dysfunction of lumbar region: Secondary | ICD-10-CM | POA: Diagnosis not present

## 2022-08-29 DIAGNOSIS — M9901 Segmental and somatic dysfunction of cervical region: Secondary | ICD-10-CM | POA: Diagnosis not present

## 2022-08-29 DIAGNOSIS — M5136 Other intervertebral disc degeneration, lumbar region: Secondary | ICD-10-CM | POA: Diagnosis not present

## 2022-08-29 DIAGNOSIS — M9905 Segmental and somatic dysfunction of pelvic region: Secondary | ICD-10-CM | POA: Diagnosis not present

## 2022-08-29 DIAGNOSIS — M9903 Segmental and somatic dysfunction of lumbar region: Secondary | ICD-10-CM | POA: Diagnosis not present

## 2022-08-31 DIAGNOSIS — M9901 Segmental and somatic dysfunction of cervical region: Secondary | ICD-10-CM | POA: Diagnosis not present

## 2022-08-31 DIAGNOSIS — M9903 Segmental and somatic dysfunction of lumbar region: Secondary | ICD-10-CM | POA: Diagnosis not present

## 2022-08-31 DIAGNOSIS — M9905 Segmental and somatic dysfunction of pelvic region: Secondary | ICD-10-CM | POA: Diagnosis not present

## 2022-08-31 DIAGNOSIS — M5136 Other intervertebral disc degeneration, lumbar region: Secondary | ICD-10-CM | POA: Diagnosis not present

## 2022-09-02 DIAGNOSIS — M5136 Other intervertebral disc degeneration, lumbar region: Secondary | ICD-10-CM | POA: Diagnosis not present

## 2022-09-02 DIAGNOSIS — M9905 Segmental and somatic dysfunction of pelvic region: Secondary | ICD-10-CM | POA: Diagnosis not present

## 2022-09-02 DIAGNOSIS — M9901 Segmental and somatic dysfunction of cervical region: Secondary | ICD-10-CM | POA: Diagnosis not present

## 2022-09-02 DIAGNOSIS — M9903 Segmental and somatic dysfunction of lumbar region: Secondary | ICD-10-CM | POA: Diagnosis not present

## 2022-09-05 DIAGNOSIS — M9901 Segmental and somatic dysfunction of cervical region: Secondary | ICD-10-CM | POA: Diagnosis not present

## 2022-09-05 DIAGNOSIS — M9905 Segmental and somatic dysfunction of pelvic region: Secondary | ICD-10-CM | POA: Diagnosis not present

## 2022-09-05 DIAGNOSIS — M5136 Other intervertebral disc degeneration, lumbar region: Secondary | ICD-10-CM | POA: Diagnosis not present

## 2022-09-05 DIAGNOSIS — M9903 Segmental and somatic dysfunction of lumbar region: Secondary | ICD-10-CM | POA: Diagnosis not present

## 2022-09-07 DIAGNOSIS — M9903 Segmental and somatic dysfunction of lumbar region: Secondary | ICD-10-CM | POA: Diagnosis not present

## 2022-09-07 DIAGNOSIS — M5136 Other intervertebral disc degeneration, lumbar region: Secondary | ICD-10-CM | POA: Diagnosis not present

## 2022-09-07 DIAGNOSIS — M9905 Segmental and somatic dysfunction of pelvic region: Secondary | ICD-10-CM | POA: Diagnosis not present

## 2022-09-07 DIAGNOSIS — M9901 Segmental and somatic dysfunction of cervical region: Secondary | ICD-10-CM | POA: Diagnosis not present

## 2022-09-09 DIAGNOSIS — M9905 Segmental and somatic dysfunction of pelvic region: Secondary | ICD-10-CM | POA: Diagnosis not present

## 2022-09-09 DIAGNOSIS — M5136 Other intervertebral disc degeneration, lumbar region: Secondary | ICD-10-CM | POA: Diagnosis not present

## 2022-09-09 DIAGNOSIS — M9903 Segmental and somatic dysfunction of lumbar region: Secondary | ICD-10-CM | POA: Diagnosis not present

## 2022-09-09 DIAGNOSIS — M9901 Segmental and somatic dysfunction of cervical region: Secondary | ICD-10-CM | POA: Diagnosis not present

## 2022-09-13 DIAGNOSIS — M5136 Other intervertebral disc degeneration, lumbar region: Secondary | ICD-10-CM | POA: Diagnosis not present

## 2022-09-13 DIAGNOSIS — M9905 Segmental and somatic dysfunction of pelvic region: Secondary | ICD-10-CM | POA: Diagnosis not present

## 2022-09-13 DIAGNOSIS — M9901 Segmental and somatic dysfunction of cervical region: Secondary | ICD-10-CM | POA: Diagnosis not present

## 2022-09-13 DIAGNOSIS — M9903 Segmental and somatic dysfunction of lumbar region: Secondary | ICD-10-CM | POA: Diagnosis not present

## 2022-09-15 DIAGNOSIS — M5136 Other intervertebral disc degeneration, lumbar region: Secondary | ICD-10-CM | POA: Diagnosis not present

## 2022-09-15 DIAGNOSIS — M9905 Segmental and somatic dysfunction of pelvic region: Secondary | ICD-10-CM | POA: Diagnosis not present

## 2022-09-15 DIAGNOSIS — M9903 Segmental and somatic dysfunction of lumbar region: Secondary | ICD-10-CM | POA: Diagnosis not present

## 2022-09-15 DIAGNOSIS — M9901 Segmental and somatic dysfunction of cervical region: Secondary | ICD-10-CM | POA: Diagnosis not present

## 2022-09-16 DIAGNOSIS — M9903 Segmental and somatic dysfunction of lumbar region: Secondary | ICD-10-CM | POA: Diagnosis not present

## 2022-09-16 DIAGNOSIS — M9905 Segmental and somatic dysfunction of pelvic region: Secondary | ICD-10-CM | POA: Diagnosis not present

## 2022-09-16 DIAGNOSIS — M9901 Segmental and somatic dysfunction of cervical region: Secondary | ICD-10-CM | POA: Diagnosis not present

## 2022-09-16 DIAGNOSIS — M5136 Other intervertebral disc degeneration, lumbar region: Secondary | ICD-10-CM | POA: Diagnosis not present

## 2022-09-20 DIAGNOSIS — M9901 Segmental and somatic dysfunction of cervical region: Secondary | ICD-10-CM | POA: Diagnosis not present

## 2022-09-20 DIAGNOSIS — M9903 Segmental and somatic dysfunction of lumbar region: Secondary | ICD-10-CM | POA: Diagnosis not present

## 2022-09-20 DIAGNOSIS — M9905 Segmental and somatic dysfunction of pelvic region: Secondary | ICD-10-CM | POA: Diagnosis not present

## 2022-09-20 DIAGNOSIS — M5136 Other intervertebral disc degeneration, lumbar region: Secondary | ICD-10-CM | POA: Diagnosis not present

## 2022-09-22 DIAGNOSIS — M9903 Segmental and somatic dysfunction of lumbar region: Secondary | ICD-10-CM | POA: Diagnosis not present

## 2022-09-22 DIAGNOSIS — M9901 Segmental and somatic dysfunction of cervical region: Secondary | ICD-10-CM | POA: Diagnosis not present

## 2022-09-22 DIAGNOSIS — M9905 Segmental and somatic dysfunction of pelvic region: Secondary | ICD-10-CM | POA: Diagnosis not present

## 2022-09-22 DIAGNOSIS — M5136 Other intervertebral disc degeneration, lumbar region: Secondary | ICD-10-CM | POA: Diagnosis not present

## 2022-09-28 DIAGNOSIS — M9901 Segmental and somatic dysfunction of cervical region: Secondary | ICD-10-CM | POA: Diagnosis not present

## 2022-09-28 DIAGNOSIS — M9905 Segmental and somatic dysfunction of pelvic region: Secondary | ICD-10-CM | POA: Diagnosis not present

## 2022-09-28 DIAGNOSIS — M5136 Other intervertebral disc degeneration, lumbar region: Secondary | ICD-10-CM | POA: Diagnosis not present

## 2022-09-28 DIAGNOSIS — M9903 Segmental and somatic dysfunction of lumbar region: Secondary | ICD-10-CM | POA: Diagnosis not present

## 2022-09-30 DIAGNOSIS — M9901 Segmental and somatic dysfunction of cervical region: Secondary | ICD-10-CM | POA: Diagnosis not present

## 2022-09-30 DIAGNOSIS — M5136 Other intervertebral disc degeneration, lumbar region: Secondary | ICD-10-CM | POA: Diagnosis not present

## 2022-09-30 DIAGNOSIS — M9903 Segmental and somatic dysfunction of lumbar region: Secondary | ICD-10-CM | POA: Diagnosis not present

## 2022-09-30 DIAGNOSIS — M9905 Segmental and somatic dysfunction of pelvic region: Secondary | ICD-10-CM | POA: Diagnosis not present

## 2022-10-05 DIAGNOSIS — M9901 Segmental and somatic dysfunction of cervical region: Secondary | ICD-10-CM | POA: Diagnosis not present

## 2022-10-05 DIAGNOSIS — M9905 Segmental and somatic dysfunction of pelvic region: Secondary | ICD-10-CM | POA: Diagnosis not present

## 2022-10-05 DIAGNOSIS — M9903 Segmental and somatic dysfunction of lumbar region: Secondary | ICD-10-CM | POA: Diagnosis not present

## 2022-10-05 DIAGNOSIS — M5136 Other intervertebral disc degeneration, lumbar region: Secondary | ICD-10-CM | POA: Diagnosis not present

## 2022-10-07 DIAGNOSIS — M9901 Segmental and somatic dysfunction of cervical region: Secondary | ICD-10-CM | POA: Diagnosis not present

## 2022-10-07 DIAGNOSIS — M9903 Segmental and somatic dysfunction of lumbar region: Secondary | ICD-10-CM | POA: Diagnosis not present

## 2022-10-07 DIAGNOSIS — M5136 Other intervertebral disc degeneration, lumbar region: Secondary | ICD-10-CM | POA: Diagnosis not present

## 2022-10-07 DIAGNOSIS — M9905 Segmental and somatic dysfunction of pelvic region: Secondary | ICD-10-CM | POA: Diagnosis not present

## 2022-10-11 DIAGNOSIS — M9903 Segmental and somatic dysfunction of lumbar region: Secondary | ICD-10-CM | POA: Diagnosis not present

## 2022-10-11 DIAGNOSIS — M5136 Other intervertebral disc degeneration, lumbar region: Secondary | ICD-10-CM | POA: Diagnosis not present

## 2022-10-11 DIAGNOSIS — M9901 Segmental and somatic dysfunction of cervical region: Secondary | ICD-10-CM | POA: Diagnosis not present

## 2022-10-11 DIAGNOSIS — M9905 Segmental and somatic dysfunction of pelvic region: Secondary | ICD-10-CM | POA: Diagnosis not present

## 2022-10-13 DIAGNOSIS — M5136 Other intervertebral disc degeneration, lumbar region: Secondary | ICD-10-CM | POA: Diagnosis not present

## 2022-10-13 DIAGNOSIS — M9905 Segmental and somatic dysfunction of pelvic region: Secondary | ICD-10-CM | POA: Diagnosis not present

## 2022-10-13 DIAGNOSIS — M9901 Segmental and somatic dysfunction of cervical region: Secondary | ICD-10-CM | POA: Diagnosis not present

## 2022-10-13 DIAGNOSIS — M9903 Segmental and somatic dysfunction of lumbar region: Secondary | ICD-10-CM | POA: Diagnosis not present

## 2022-10-18 DIAGNOSIS — M9903 Segmental and somatic dysfunction of lumbar region: Secondary | ICD-10-CM | POA: Diagnosis not present

## 2022-10-18 DIAGNOSIS — M9905 Segmental and somatic dysfunction of pelvic region: Secondary | ICD-10-CM | POA: Diagnosis not present

## 2022-10-18 DIAGNOSIS — M9901 Segmental and somatic dysfunction of cervical region: Secondary | ICD-10-CM | POA: Diagnosis not present

## 2022-10-18 DIAGNOSIS — M51362 Other intervertebral disc degeneration, lumbar region with discogenic back pain and lower extremity pain: Secondary | ICD-10-CM | POA: Diagnosis not present

## 2022-10-19 DIAGNOSIS — H35033 Hypertensive retinopathy, bilateral: Secondary | ICD-10-CM | POA: Diagnosis not present

## 2022-10-25 DIAGNOSIS — M51362 Other intervertebral disc degeneration, lumbar region with discogenic back pain and lower extremity pain: Secondary | ICD-10-CM | POA: Diagnosis not present

## 2022-10-25 DIAGNOSIS — M9905 Segmental and somatic dysfunction of pelvic region: Secondary | ICD-10-CM | POA: Diagnosis not present

## 2022-10-25 DIAGNOSIS — M9903 Segmental and somatic dysfunction of lumbar region: Secondary | ICD-10-CM | POA: Diagnosis not present

## 2022-10-25 DIAGNOSIS — M9901 Segmental and somatic dysfunction of cervical region: Secondary | ICD-10-CM | POA: Diagnosis not present

## 2022-10-28 DIAGNOSIS — M9901 Segmental and somatic dysfunction of cervical region: Secondary | ICD-10-CM | POA: Diagnosis not present

## 2022-10-28 DIAGNOSIS — M51362 Other intervertebral disc degeneration, lumbar region with discogenic back pain and lower extremity pain: Secondary | ICD-10-CM | POA: Diagnosis not present

## 2022-10-28 DIAGNOSIS — M9905 Segmental and somatic dysfunction of pelvic region: Secondary | ICD-10-CM | POA: Diagnosis not present

## 2022-10-28 DIAGNOSIS — M9903 Segmental and somatic dysfunction of lumbar region: Secondary | ICD-10-CM | POA: Diagnosis not present

## 2022-11-02 DIAGNOSIS — M9905 Segmental and somatic dysfunction of pelvic region: Secondary | ICD-10-CM | POA: Diagnosis not present

## 2022-11-02 DIAGNOSIS — M9901 Segmental and somatic dysfunction of cervical region: Secondary | ICD-10-CM | POA: Diagnosis not present

## 2022-11-02 DIAGNOSIS — M51362 Other intervertebral disc degeneration, lumbar region with discogenic back pain and lower extremity pain: Secondary | ICD-10-CM | POA: Diagnosis not present

## 2022-11-02 DIAGNOSIS — M9903 Segmental and somatic dysfunction of lumbar region: Secondary | ICD-10-CM | POA: Diagnosis not present

## 2022-11-03 NOTE — Progress Notes (Signed)
Name: Angel Chase   MRN: [id number]    DOB: 30-Apr-1965   Date:11/04/2022       Progress Note  Subjective  Chief Complaint  Follow Up  HPI  Iron deficiency anemia due macroscopic hematuria: symptoms started over 20 years ago. He has seen  Urologist, hematologist and Nephrologist in the past . Last labs was good, normal HCT. He still has hematuria daily No fever or chills or dysuria . Last CBC was normal. Last CT below He is still taking iron tablets , we will check labs and if normal he stop taking it    IMPRESSION:03/02/2021   1. No hydronephrosis.  No renal, ureteral or bladder calculi. 2. Bilateral renal cysts.  No solid enhancing renal mass. 3. Cholelithiasis without findings of acute cholecystitis. 4. Left-sided colonic diverticulosis without findings of acute diverticulitis. 5. Moderate-sized fat containing ventral hernia.   HTN: he is taking medication, denies side effects No chest pain, dizziness or palpitation , we will send a refill and recheck labs today    Morbid Obesity:  he denies polyphagia, polydipsia or polyuria. He has HTN, dyslipidemia and insulin resistance, last A1C was 5.8 % , his BMI is over 40 . Unable to take  Metformin because it caused hematuria in the past. Discussed GLP-1 agonist. He denies personal history of pancreatitis or family history of thyroid cancer   Hyperlipidemia:he is back on Crestor 5 mg, we will recheck labs today    The 10-year ASCVD risk score (Arnett DK, et al., 2019) is: 9.7%   Values used to calculate the score:     Age: 57 years     Sex: Male     Is Non-Hispanic African American: Yes     Diabetic: No     Tobacco smoker: No     Systolic Blood Pressure: 126 mmHg     Is BP treated: Yes     HDL Cholesterol: 50 mg/dL     Total Cholesterol: 142 mg/dL   ED: long history , stable on Viagra ( Cialis causes side effects) , he still has medication at  home   Vitamin D deficiency: he is taking vitamin D 2000 units daily, last level was  at goal and we will not recheck it today   Umbilical hernia: no problems at this time,he was seen by surgeon but must lose weight prior to having surgery , we will try weight loss medication  Cholelithiasis: reminded him about symptoms of strangulation  and when to go to [facility].    Left plantar fascitis:symptoms of left heel pain started almost year ago, that caused back pain, he is currently seeing chiropractor and is feeling better now   Patient Active Problem List   Diagnosis Date Noted   Ventral hernia without obstruction or gangrene 03/26/2021   Calculus of gallbladder without cholecystitis without obstruction 03/26/2021   Umbilical hernia without obstruction and without gangrene 10/01/2020   Insulin resistance 07/26/2018   Recurrent hematuria 03/29/2018   Iron deficiency anemia due to chronic blood loss 03/29/2018   Pure hypercholesterolemia 03/29/2018   History of kidney stones 05/17/2017   Abnormal ECG 05/30/2016   History of iron deficiency anemia 04/28/2016   History of diverticulitis 03/04/2016   Hyperlipidemia 01/27/2015   Hematuria, gross 07/10/2014   Failure of erection 07/10/2014   Hypertension, benign 07/10/2014   Morbid obesity (HCC) 10/09/2013   Vitamin D deficiency 10/09/2013    Past Surgical History:  Procedure Laterality Date   ANKLE SURGERY Left 03/07/2011  fracture from trauma   COLONOSCOPY WITH PROPOFOL N/A 07/01/2016   Procedure: COLONOSCOPY WITH PROPOFOL;  Surgeon: Wyline Mood, MD;  Location: [facility] ENDOSCOPY;  Service: Endoscopy;  Laterality: N/A;   CYSTOSCOPY W/ RETROGRADES Bilateral 06/01/2016   Procedure: CYSTOSCOPY WITH RETROGRADE PYELOGRAM;  Surgeon: Hildred Laser, MD;  Location: [facility] ORS;  Service: Urology;  Laterality: Bilateral;   CYSTOSCOPY WITH STENT PLACEMENT Left 06/01/2016   Procedure: CYSTOSCOPY WITH STENT PLACEMENT;  Surgeon: Hildred Laser, MD;  Location: [facility] ORS;  Service: Urology;  Laterality: Left;   ESOPHAGOGASTRODUODENOSCOPY  (EGD) WITH PROPOFOL N/A 07/01/2016   Procedure: ESOPHAGOGASTRODUODENOSCOPY (EGD) WITH PROPOFOL;  Surgeon: Wyline Mood, MD;  [facility] ENDOSCOPY;  Service: Endoscopy;  Laterality: N/A;   FLEXIBLE SIGMOIDOSCOPY N/A 02/24/2017   Procedure: FLEXIBLE SIGMOIDOSCOPY;  Surgeon: Wyline Mood, MD;  Location: [facility] ENDOSCOPY;  Service: Gastroenterology;  Laterality: N/A;   IR RADIOLOGIST EVAL & MGMT  06/16/2016   IR RENAL SELECTIVE  UNI INC S&I MOD SED  07/28/2016   IR US GUIDE VASC ACCESS LEFT  07/28/2016   STONE EXTRACTION WITH BASKET Left 06/01/2016   Procedure: STONE EXTRACTION WITH BASKET;  Surgeon: Hildred Laser, MD;  Location: [facility] ORS;  Service: Urology;  Laterality: Left;   TONSILLECTOMY     TONSILLECTOMY AND ADENOIDECTOMY  age 21   URETEROSCOPY Left 06/01/2016   Procedure: URETEROSCOPY;  Surgeon: Hildred Laser, MD;  Location: [facility] ORS;  Service: Urology;  Laterality: Left;    Family History  Problem Relation Age of Onset   Breast cancer Mother    Hypertension Mother    Diabetes Father    Hypertension Father    Hypertension Sister    Pancreatic disease Sister    Diabetes Sister    Hypertension Sister    Hypertension Brother    Diverticulosis Brother    Prostate cancer Maternal Uncle    Prostate cancer Maternal Uncle    Dementia Maternal Grandmother    Leukemia Maternal Grandfather    Heart disease Paternal Grandmother    Stomach cancer Paternal Grandfather    Kidney cancer Neg Hx    Bladder Cancer Neg Hx     Social History   Tobacco Use   Smoking status: Never   Smokeless tobacco: Never  Substance Use Topics   Alcohol use: No    Alcohol/week: 0.0 standard drinks of alcohol     Current Outpatient Medications:    Cholecalciferol (VITAMIN D) 50 MCG (2000 UT) CAPS, Take 1 capsule (2,000 Units total) by mouth daily., Disp: 30 capsule, Rfl: 11   ferrous sulfate 325 (65 FE) MG EC tablet, Take 325 mg by mouth daily with breakfast. (0600 & 1530), Disp: , Rfl:    Multiple  Vitamin (MULTIVITAMIN WITH MINERALS) TABS tablet, Take 1 tablet by mouth daily at 6 (six) AM., Disp: , Rfl:    sildenafil (VIAGRA) 100 MG tablet, Take 0.5-1 tablets (50-100 mg total) by mouth daily as needed for erectile dysfunction., Disp: 30 tablet, Rfl: 1   tirzepatide (ZEPBOUND) 2.5 MG/0.5ML injection vial, Inject 2.5 mg into the skin once a week., Disp: 2 mL, Rfl: 0   olmesartan-hydrochlorothiazide (BENICAR HCT) 40-25 MG tablet, Take 1 tablet by mouth daily. In place of micardis hctz due to insurance, Disp: 90 tablet, Rfl: 1   rosuvastatin (CRESTOR) 5 MG tablet, Take 1 tablet (5 mg total) by mouth daily., Disp: 90 tablet, Rfl: 1  Allergies  Allergen Reactions   Cialis [Tadalafil] Other (See Comments)    Joint aches  I personally reviewed active problem list, medication list, allergies, family history, social history, health maintenance with the patient/caregiver today.   ROS  Ten systems reviewed and is negative except as mentioned in HPI    Objective  Vitals:   11/04/22 0830  BP: 126/78  Pulse: 95  Resp: 18  Temp: 98.2 F (36.8 C)  TempSrc: Oral  SpO2: 97%  Weight: (!) 344 lb 11.2 oz (156.4 kg)  Height: 5\' 11"  (1.803 m)    Body mass index is 48.08 kg/m.  Physical Exam  Constitutional: Patient appears well-developed and well-nourished. Obese  No distress.  HEENT: head atraumatic, normocephalic, pupils equal and reactive to light, neck supple Cardiovascular: Normal rate, regular rhythm and normal heart sounds.  No murmur heard. No BLE edema. Pulmonary/Chest: Effort normal and breath sounds normal. No respiratory distress. Abdominal: Soft.  There is no tenderness. Psychiatric: Patient has a normal mood and affect. behavior is normal. Judgment and thought content normal.   PHQ2/9:    11/04/2022    8:32 AM 04/29/2022   10:08 AM 01/21/2022    8:18 AM 10/22/2021    9:41 AM 03/26/2021    9:34 AM  Depression screen PHQ 2/9  Decreased Interest 0 0 0 0 0  Down,  Depressed, Hopeless 0 0 0 0 0  PHQ - 2 Score 0 0 0 0 0  Altered sleeping 0 0 0 0 0  Tired, decreased energy 0 0 0 0 0  Change in appetite 0 0 0 0 0  Feeling bad or failure about yourself  0 0 0 0 0  Trouble concentrating 0 0 0 0 0  Moving slowly or fidgety/restless 0 0 0 0 0  Suicidal thoughts 0 0 0 0 0  PHQ-9 Score 0 0 0 0 0  Difficult doing work/chores     Not difficult at all    phq 9 is negative   Fall Risk:    11/04/2022    8:32 AM 04/29/2022   10:08 AM 01/21/2022    8:18 AM 10/22/2021    9:41 AM 03/26/2021    9:34 AM  Fall Risk   Falls in the past year? 0 0 1 0 0  Number falls in past yr:   0 0 0  Injury with Fall?   0 0 0  Risk for fall due to : No Fall Risks No Fall Risks No Fall Risks No Fall Risks No Fall Risks  Follow up Falls prevention discussed Falls prevention discussed Falls prevention discussed Falls prevention discussed Falls prevention discussed      Functional Status Survey: Is the patient deaf or have difficulty hearing?: No Does the patient have difficulty seeing, even when wearing glasses/contacts?: No Does the patient have difficulty concentrating, remembering, or making decisions?: No Does the patient have difficulty walking or climbing stairs?: No Does the patient have difficulty dressing or bathing?: No Does the patient have difficulty doing errands alone such as visiting a doctor's office or shopping?: No    Assessment & Plan  1. Need for immunization against influenza  - Flu vaccine trivalent PF, 6mos and older(Flulaval,Afluria,Fluarix,Fluzone)  2. Morbid obesity (HCC)  - tirzepatide (ZEPBOUND) 2.5 MG/0.5ML injection vial; Inject 2.5 mg into the skin once a week.  Dispense: 2 mL; Refill: 0  3. Pure hypercholesterolemia  - rosuvastatin (CRESTOR) 5 MG tablet; Take 1 tablet (5 mg total) by mouth daily.  Dispense: 90 tablet; Refill: 1 - Lipid panel  4. Essential hypertension  - olmesartan-hydrochlorothiazide (BENICAR HCT) 40-25  MG  tablet; Take 1 tablet by mouth daily. In place of micardis hctz due to insurance  Dispense: 90 tablet; Refill: 1 - CBC with Differential/Platelet - COMPLETE METABOLIC PANEL WITH GFR  5. Vitamin D deficiency  Continue supplementation   6. Insulin resistance  - Hemoglobin A1c  7. History of iron deficiency anemia  - CBC with Differential/Platelet - Iron, TIBC and Ferritin Panel  8. Prostate cancer screening  - PSA  9. Family history of prostate cancer  - PSA

## 2022-11-04 ENCOUNTER — Ambulatory Visit: Payer: BC Managed Care – PPO | Admitting: Family Medicine

## 2022-11-04 ENCOUNTER — Encounter: Payer: Self-pay | Admitting: Family Medicine

## 2022-11-04 DIAGNOSIS — E7800 Pure hypercholesterolemia, unspecified: Secondary | ICD-10-CM | POA: Diagnosis not present

## 2022-11-04 DIAGNOSIS — Z125 Encounter for screening for malignant neoplasm of prostate: Secondary | ICD-10-CM

## 2022-11-04 DIAGNOSIS — I10 Essential (primary) hypertension: Secondary | ICD-10-CM | POA: Diagnosis not present

## 2022-11-04 DIAGNOSIS — Z8042 Family history of malignant neoplasm of prostate: Secondary | ICD-10-CM

## 2022-11-04 DIAGNOSIS — Z862 Personal history of diseases of the blood and blood-forming organs and certain disorders involving the immune mechanism: Secondary | ICD-10-CM | POA: Diagnosis not present

## 2022-11-04 DIAGNOSIS — M9905 Segmental and somatic dysfunction of pelvic region: Secondary | ICD-10-CM | POA: Diagnosis not present

## 2022-11-04 DIAGNOSIS — M9901 Segmental and somatic dysfunction of cervical region: Secondary | ICD-10-CM | POA: Diagnosis not present

## 2022-11-04 DIAGNOSIS — E559 Vitamin D deficiency, unspecified: Secondary | ICD-10-CM

## 2022-11-04 DIAGNOSIS — M51362 Other intervertebral disc degeneration, lumbar region with discogenic back pain and lower extremity pain: Secondary | ICD-10-CM | POA: Diagnosis not present

## 2022-11-04 DIAGNOSIS — Z23 Encounter for immunization: Secondary | ICD-10-CM

## 2022-11-04 DIAGNOSIS — E88819 Insulin resistance, unspecified: Secondary | ICD-10-CM | POA: Diagnosis not present

## 2022-11-04 DIAGNOSIS — M9903 Segmental and somatic dysfunction of lumbar region: Secondary | ICD-10-CM | POA: Diagnosis not present

## 2022-11-04 MED ORDER — OLMESARTAN MEDOXOMIL-HCTZ 40-25 MG PO TABS: 1.0000 | ORAL_TABLET | Freq: Every day | ORAL | 1 refills | Status: DC

## 2022-11-04 MED ORDER — TIRZEPATIDE-WEIGHT MANAGEMENT 2.5 MG/0.5ML ~~LOC~~ SOLN: 2.5000 mg | SUBCUTANEOUS | 0 refills | Status: DC

## 2022-11-04 MED ORDER — ROSUVASTATIN CALCIUM 5 MG PO TABS: 5.0000 mg | ORAL_TABLET | Freq: Every day | ORAL | 1 refills | Status: DC

## 2022-11-05 LAB — COMPLETE METABOLIC PANEL WITH GFR
AG Ratio: 1.5 (calc) (ref 1.0–2.5)
ALT: 22 U/L (ref 9–46)
AST: 16 U/L (ref 10–35)
Albumin: 4 g/dL (ref 3.6–5.1)
Alkaline phosphatase (APISO): 69 U/L (ref 35–144)
BUN: 11 mg/dL (ref 7–25)
CO2: 29 mmol/L (ref 20–32)
Calcium: 9.4 mg/dL (ref 8.6–10.3)
Chloride: 100 mmol/L (ref 98–110)
Creat: 0.96 mg/dL (ref 0.70–1.30)
Globulin: 2.6 g/dL (ref 1.9–3.7)
Glucose, Bld: 93 mg/dL (ref 65–99)
Potassium: 3.8 mmol/L (ref 3.5–5.3)
Sodium: 138 mmol/L (ref 135–146)
Total Bilirubin: 0.4 mg/dL (ref 0.2–1.2)
Total Protein: 6.6 g/dL (ref 6.1–8.1)
eGFR: 93 mL/min/{1.73_m2} (ref >=60)

## 2022-11-05 LAB — CBC WITH DIFFERENTIAL/PLATELET
Absolute Lymphocytes: 1562 {cells}/uL (ref 850–3900)
Absolute Monocytes: 806 {cells}/uL (ref 200–950)
Basophils Absolute: 72 {cells}/uL (ref 0–200)
Basophils Relative: 1 %
Eosinophils Absolute: 79 {cells}/uL (ref 15–500)
Eosinophils Relative: 1.1 %
HCT: 45 % (ref 38.5–50.0)
Hemoglobin: 15 g/dL (ref 13.2–17.1)
MCH: 28.8 pg (ref 27.0–33.0)
MCHC: 33.3 g/dL (ref 32.0–36.0)
MCV: 86.4 fL (ref 80.0–100.0)
MPV: 11.3 fL (ref 7.5–12.5)
Monocytes Relative: 11.2 %
Neutro Abs: 4680 {cells}/uL (ref 1500–7800)
Neutrophils Relative %: 65 %
Platelets: 196 10*3/uL (ref 140–400)
RBC: 5.21 10*6/uL (ref 4.20–5.80)
RDW: 13.2 % (ref 11.0–15.0)
Total Lymphocyte: 21.7 %
WBC: 7.2 10*3/uL (ref 3.8–10.8)

## 2022-11-05 LAB — IRON,TIBC AND FERRITIN PANEL
%SAT: 32 % (ref 20–48)
Ferritin: 171 ng/mL (ref 38–380)
Iron: 89 ug/dL (ref 50–180)
TIBC: 276 ug/dL (ref 250–425)

## 2022-11-05 LAB — PSA: PSA: 0.44 ng/mL (ref <=4.00)

## 2022-11-05 LAB — LIPID PANEL
Cholesterol: 150 mg/dL (ref <200)
HDL: 46 mg/dL (ref >=40)
LDL Cholesterol (Calc): 81 mg/dL
Non-HDL Cholesterol (Calc): 104 mg/dL (ref <130)
Total CHOL/HDL Ratio: 3.3 (calc) (ref <5.0)
Triglycerides: 126 mg/dL (ref <150)

## 2022-11-05 LAB — HEMOGLOBIN A1C
Hgb A1c MFr Bld: 6.2 %{Hb} — ABNORMAL HIGH (ref <5.7)
Mean Plasma Glucose: 131 mg/dL
eAG (mmol/L): 7.3 mmol/L

## 2022-11-11 DIAGNOSIS — M9905 Segmental and somatic dysfunction of pelvic region: Secondary | ICD-10-CM | POA: Diagnosis not present

## 2022-11-11 DIAGNOSIS — M9901 Segmental and somatic dysfunction of cervical region: Secondary | ICD-10-CM | POA: Diagnosis not present

## 2022-11-11 DIAGNOSIS — M9903 Segmental and somatic dysfunction of lumbar region: Secondary | ICD-10-CM | POA: Diagnosis not present

## 2022-11-11 DIAGNOSIS — M51362 Other intervertebral disc degeneration, lumbar region with discogenic back pain and lower extremity pain: Secondary | ICD-10-CM | POA: Diagnosis not present

## 2022-11-18 DIAGNOSIS — M9903 Segmental and somatic dysfunction of lumbar region: Secondary | ICD-10-CM | POA: Diagnosis not present

## 2022-11-18 DIAGNOSIS — M9905 Segmental and somatic dysfunction of pelvic region: Secondary | ICD-10-CM | POA: Diagnosis not present

## 2022-11-18 DIAGNOSIS — M9901 Segmental and somatic dysfunction of cervical region: Secondary | ICD-10-CM | POA: Diagnosis not present

## 2022-11-18 DIAGNOSIS — M51362 Other intervertebral disc degeneration, lumbar region with discogenic back pain and lower extremity pain: Secondary | ICD-10-CM | POA: Diagnosis not present

## 2022-11-25 DIAGNOSIS — M9903 Segmental and somatic dysfunction of lumbar region: Secondary | ICD-10-CM | POA: Diagnosis not present

## 2022-11-25 DIAGNOSIS — M9905 Segmental and somatic dysfunction of pelvic region: Secondary | ICD-10-CM | POA: Diagnosis not present

## 2022-11-25 DIAGNOSIS — M51362 Other intervertebral disc degeneration, lumbar region with discogenic back pain and lower extremity pain: Secondary | ICD-10-CM | POA: Diagnosis not present

## 2022-11-25 DIAGNOSIS — M9901 Segmental and somatic dysfunction of cervical region: Secondary | ICD-10-CM | POA: Diagnosis not present

## 2022-12-01 DIAGNOSIS — M9903 Segmental and somatic dysfunction of lumbar region: Secondary | ICD-10-CM | POA: Diagnosis not present

## 2022-12-01 DIAGNOSIS — M9905 Segmental and somatic dysfunction of pelvic region: Secondary | ICD-10-CM | POA: Diagnosis not present

## 2022-12-01 DIAGNOSIS — M51362 Other intervertebral disc degeneration, lumbar region with discogenic back pain and lower extremity pain: Secondary | ICD-10-CM | POA: Diagnosis not present

## 2022-12-01 DIAGNOSIS — M9901 Segmental and somatic dysfunction of cervical region: Secondary | ICD-10-CM | POA: Diagnosis not present

## 2022-12-23 DIAGNOSIS — M9901 Segmental and somatic dysfunction of cervical region: Secondary | ICD-10-CM | POA: Diagnosis not present

## 2022-12-23 DIAGNOSIS — M9905 Segmental and somatic dysfunction of pelvic region: Secondary | ICD-10-CM | POA: Diagnosis not present

## 2022-12-23 DIAGNOSIS — M51362 Other intervertebral disc degeneration, lumbar region with discogenic back pain and lower extremity pain: Secondary | ICD-10-CM | POA: Diagnosis not present

## 2022-12-23 DIAGNOSIS — M9903 Segmental and somatic dysfunction of lumbar region: Secondary | ICD-10-CM | POA: Diagnosis not present

## 2023-01-06 DIAGNOSIS — M51362 Other intervertebral disc degeneration, lumbar region with discogenic back pain and lower extremity pain: Secondary | ICD-10-CM | POA: Diagnosis not present

## 2023-01-06 DIAGNOSIS — M9901 Segmental and somatic dysfunction of cervical region: Secondary | ICD-10-CM | POA: Diagnosis not present

## 2023-01-06 DIAGNOSIS — M9905 Segmental and somatic dysfunction of pelvic region: Secondary | ICD-10-CM | POA: Diagnosis not present

## 2023-01-06 DIAGNOSIS — M9903 Segmental and somatic dysfunction of lumbar region: Secondary | ICD-10-CM | POA: Diagnosis not present

## 2023-01-27 ENCOUNTER — Encounter: Payer: Self-pay | Admitting: Family Medicine

## 2023-01-27 ENCOUNTER — Ambulatory Visit (INDEPENDENT_AMBULATORY_CARE_PROVIDER_SITE_OTHER): Payer: BC Managed Care – PPO | Admitting: Family Medicine

## 2023-01-27 VITALS — BP 134/80 | HR 86 | Temp 97.7°F | Resp 16 | Ht 71.0 in | Wt 330.3 lb

## 2023-01-27 DIAGNOSIS — Z23 Encounter for immunization: Secondary | ICD-10-CM

## 2023-01-27 DIAGNOSIS — Z0000 Encounter for general adult medical examination without abnormal findings: Secondary | ICD-10-CM

## 2023-01-27 NOTE — Progress Notes (Signed)
 Name: Angel Chase   MRN: [id number]    DOB: 1965/02/13   Date:01/27/2023       Progress Note  Subjective  Chief Complaint  Chief Complaint  Patient presents with   Annual Exam    HPI  Patient presents for annual CPE .   IPSS     Row Name 01/27/23 0804         International Prostate Symptom Score   How often have you had the sensation of not emptying your bladder? Not at All     How often have you had to urinate less than every two hours? Not at All     How often have you found you stopped and started again several times when you urinated? Not at All     How often have you found it difficult to postpone urination? Not at All     How often have you had a weak urinary stream? Not at All     How often have you had to strain to start urination? Not at All     How many times did you typically get up at night to urinate? 2 Times     Total IPSS Score 2       Quality of Life due to urinary symptoms   If you were to spend the rest of your life with your urinary condition just the way it is now how would you feel about that? Pleased              Diet: he has changed his diet significantly over the past 3 months, avoiding sweet tea, eating wraps instead of bread and also cooking more at home, he lost 15 lbs since Exercise: discussed importance of regular activity  Last Dental Exam: up to date Last Eye Exam: up to date   Depression: phq 9 is negative    01/27/2023    8:00 AM 11/04/2022    8:32 AM 04/29/2022   10:08 AM 01/21/2022    8:18 AM 10/22/2021    9:41 AM  Depression screen PHQ 2/9  Decreased Interest 0 0 0 0 0  Down, Depressed, Hopeless 0 0 0 0 0  PHQ - 2 Score 0 0 0 0 0  Altered sleeping 0 0 0 0 0  Tired, decreased energy 0 0 0 0 0  Change in appetite 0 0 0 0 0  Feeling bad or failure about yourself  0 0 0 0 0  Trouble concentrating 0 0 0 0 0  Moving slowly or fidgety/restless 0 0 0 0 0  Suicidal thoughts 0 0 0 0 0  PHQ-9 Score 0 0 0 0 0  Difficult doing  work/chores Not difficult at all        Hypertension:  BP Readings from Last 3 Encounters:  01/27/23 134/80  11/04/22 126/78  04/29/22 132/78    Obesity: Wt Readings from Last 3 Encounters:  01/27/23 (!) 330 lb 4.8 oz (149.8 kg)  11/04/22 (!) 344 lb 11.2 oz (156.4 kg)  04/29/22 (!) 337 lb 8 oz (153.1 kg)   BMI Readings from Last 3 Encounters:  01/27/23 46.07 kg/m  11/04/22 48.08 kg/m  04/29/22 47.07 kg/m     [employer] Visit from 01/27/2023 in [facility]  AUDIT-C Score 0       Married STD testing and prevention (HIV/chl/gon/syphilis): not applicable Sexual history: he uses Viagra  and is doing well  Hep C Screening: completed Skin cancer: Discussed monitoring for  atypical lesions Colorectal cancer: repeat in 2028 Prostate cancer:  yes Lab Results  Component Value Date   PSA 0.44 11/04/2022   PSA 0.62 10/22/2021   PSA 0.55 09/25/2020     Lung cancer:  Low Dose CT Chest recommended if Age 56-80 years, 30 pack-year currently smoking OR have quit w/in 15years. Patient  is not a candidate for screening   AAA: The USPSTF recommends one-time screening with ultrasonography in men ages 56 to 75 years who have ever smoked. Patient   is not a candidate for screening  ECG:  2018  Vaccines:  RSV: not applicable HPV: not applicable Tdap: getting today Shingrix: completed Pneumonia: declined Flu: completed COVID-19: completed  Advanced Care Planning: A voluntary discussion about advance care planning including the explanation and discussion of advance directives.  Discussed health care proxy and Living will, and the patient was able to identify a health care proxy as wife.  Patient does not have a living will and power of attorney of health care   Patient Active Problem List   Diagnosis Date Noted   Ventral hernia without obstruction or gangrene 03/26/2021   Calculus of gallbladder without cholecystitis without obstruction  03/26/2021   Umbilical hernia without obstruction and without gangrene 10/01/2020   Insulin  resistance 07/26/2018   Recurrent hematuria 03/29/2018   Iron deficiency anemia due to chronic blood loss 03/29/2018   Pure hypercholesterolemia 03/29/2018   History of kidney stones 05/17/2017   Abnormal ECG 05/30/2016   History of iron deficiency anemia 04/28/2016   History of diverticulitis 03/04/2016   Hyperlipidemia 01/27/2015   Hematuria, gross 07/10/2014   Failure of erection 07/10/2014   Hypertension, benign 07/10/2014   Morbid obesity (HCC) 10/09/2013   Vitamin D  deficiency 10/09/2013    Past Surgical History:  Procedure Laterality Date   ANKLE SURGERY Left 03/07/2011   fracture from trauma   COLONOSCOPY WITH PROPOFOL  N/A 07/01/2016   Procedure: COLONOSCOPY WITH PROPOFOL ;  Surgeon: Therisa Bi, MD;  Location: [facility] ENDOSCOPY;  Service: Endoscopy;  Laterality: N/A;   CYSTOSCOPY W/ RETROGRADES Bilateral 06/01/2016   Procedure: CYSTOSCOPY WITH RETROGRADE PYELOGRAM;  Surgeon: Chauncey Redell Agent, MD;  Location: [facility] ORS;  Service: Urology;  Laterality: Bilateral;   CYSTOSCOPY WITH STENT PLACEMENT Left 06/01/2016   Procedure: CYSTOSCOPY WITH STENT PLACEMENT;  Surgeon: Chauncey Redell Agent, MD;  Location: [facility] ORS;  Service: Urology;  Laterality: Left;   ESOPHAGOGASTRODUODENOSCOPY (EGD) WITH PROPOFOL  N/A 07/01/2016   Procedure: ESOPHAGOGASTRODUODENOSCOPY (EGD) WITH PROPOFOL ;  Surgeon: Therisa Bi, MD;  Location: [facility] ENDOSCOPY;  Service: Endoscopy;  Laterality: N/A;   FLEXIBLE SIGMOIDOSCOPY N/A 02/24/2017   Procedure: FLEXIBLE SIGMOIDOSCOPY;  Surgeon: Therisa Bi, MD;  Location: [facility] ENDOSCOPY;  Service: Gastroenterology;  Laterality: N/A;   IR RADIOLOGIST EVAL & MGMT  06/16/2016   IR RENAL SELECTIVE  UNI INC S&I MOD SED  07/28/2016   IR US  GUIDE VASC ACCESS LEFT  07/28/2016   STONE EXTRACTION WITH BASKET Left 06/01/2016   Procedure: STONE EXTRACTION WITH BASKET;  Surgeon: Chauncey Redell Agent, MD;   Location: [facility] ORS;  Service: Urology;  Laterality: Left;   TONSILLECTOMY     TONSILLECTOMY AND ADENOIDECTOMY  age 42   URETEROSCOPY Left 06/01/2016   Procedure: URETEROSCOPY;  Surgeon: Chauncey Redell Agent, MD;  Location: [facility] ORS;  Service: Urology;  Laterality: Left;    Family History  Problem Relation Age of Onset   Breast cancer Mother    Hypertension Mother    Diabetes Father    Hypertension Father  Hypertension Sister    Pancreatic disease Sister    Diabetes Sister    Hypertension Sister    Hypertension Brother    Diverticulosis Brother    Prostate cancer Maternal Uncle    Prostate cancer Maternal Uncle    Dementia Maternal Grandmother    Leukemia Maternal Grandfather    Heart disease Paternal Grandmother    Stomach cancer Paternal Grandfather    Kidney cancer Neg Hx    Bladder Cancer Neg Hx     Social History   Socioeconomic History   Marital status: Married    Spouse name: Sherrie   Number of children: 1   Years of education: Not on file   Highest education level: 12th grade  Occupational History   Occupation: Education Officer, Community  Tobacco Use   Smoking status: Never   Smokeless tobacco: Never  Vaping Use   Vaping status: Never Used  Substance and Sexual Activity   Alcohol use: No    Alcohol/week: 0.0 standard drinks of alcohol   Drug use: No   Sexual activity: Yes    Partners: Female    Comment: wife hysterectomy   Other Topics Concern   Not on file  Social History Narrative   Not on file   Social Drivers of Health   Financial Resource Strain: Low Risk  (01/27/2023)   Overall Financial Resource Strain (CARDIA)    Difficulty of Paying Living Expenses: Not hard at all  Food Insecurity: No Food Insecurity (01/27/2023)   Hunger Vital Sign    Worried About Running Out of Food in the Last Year: Never true    Ran Out of Food in the Last Year: Never true  Transportation Needs: No Transportation Needs (01/27/2023)   PRAPARE - Doctor, General Practice (Medical): No    Lack of Transportation (Non-Medical): No  Physical Activity: Inactive (01/27/2023)   Exercise Vital Sign    Days of Exercise per Week: 0 days    Minutes of Exercise per Session: 0 min  Stress: No Stress Concern Present (01/27/2023)   [employer] of Occupational Health - Occupational Stress Questionnaire    Feeling of Stress : Not at all  Social Connections: Moderately Integrated (01/27/2023)   Social Connection and Isolation Panel [NHANES]    Frequency of Communication with Friends and Family: More than three times a week    Frequency of Social Gatherings with Friends and Family: More than three times a week    Attends Religious Services: More than 4 times per year    Active Member of [employer] or Organizations: No    Attends Banker Meetings: Never    Marital Status: Married  Catering Manager Violence: Not At Risk (01/27/2023)   Humiliation, Afraid, Rape, and Kick questionnaire    Fear of Current or Ex-Partner: No    Emotionally Abused: No    Physically Abused: No    Sexually Abused: No     Current Outpatient Medications:    Cholecalciferol (VITAMIN D ) 50 MCG (2000 UT) CAPS, Take 1 capsule (2,000 Units total) by mouth daily., Disp: 30 capsule, Rfl: 11   ferrous sulfate 325 (65 FE) MG EC tablet, Take 325 mg by mouth daily with breakfast. (0600 & 1530), Disp: , Rfl:    Multiple Vitamin (MULTIVITAMIN WITH MINERALS) TABS tablet, Take 1 tablet by mouth daily at 6 (six) AM., Disp: , Rfl:    olmesartan -hydrochlorothiazide (BENICAR  HCT) 40-25 MG tablet, Take 1 tablet by mouth daily. In place of micardis   hctz due to insurance, Disp: 90 tablet, Rfl: 1   rosuvastatin  (CRESTOR ) 5 MG tablet, Take 1 tablet (5 mg total) by mouth daily., Disp: 90 tablet, Rfl: 1   sildenafil  (VIAGRA ) 100 MG tablet, Take 0.5-1 tablets (50-100 mg total) by mouth daily as needed for erectile dysfunction., Disp: 30 tablet, Rfl: 1   tirzepatide  (ZEPBOUND ) 2.5 MG/0.5ML  injection vial, Inject 2.5 mg into the skin once a week. (Patient not taking: Reported on 01/27/2023), Disp: 2 mL, Rfl: 0  Allergies  Allergen Reactions   Cialis  [Tadalafil ] Other (See Comments)    Joint aches      ROS  Constitutional: Negative for fever, positive for  weight change.  Respiratory: Negative for cough and shortness of breath.   Cardiovascular: Negative for chest pain or palpitations.  Gastrointestinal: Negative for abdominal pain, no bowel changes.  Musculoskeletal: Negative for gait problem or joint swelling.  Skin: Negative for rash.  Neurological: Negative for dizziness or headache.  No other specific complaints in a complete review of systems (except as listed in HPI above).   Objective  Vitals:   01/27/23 0807  BP: 134/80  Pulse: 86  Resp: 16  Temp: 97.7 F (36.5 C)  TempSrc: Oral  SpO2: 99%  Weight: (!) 330 lb 4.8 oz (149.8 kg)  Height: 5' 11 (1.803 m)    Body mass index is 46.07 kg/m.  Physical Exam  Constitutional: Patient appears well-developed and well-nourished. No distress.  HENT: Head: Normocephalic and atraumatic. Ears: B TMs ok, no erythema or effusion; Nose: Nose normal. Mouth/Throat: Oropharynx is clear and moist. No oropharyngeal exudate.  Eyes: Conjunctivae and EOM are normal. Pupils are equal, round, and reactive to light. No scleral icterus.  Neck: Normal range of motion. Neck supple. No JVD present. No thyromegaly present.  Cardiovascular: Normal rate, regular rhythm and normal heart sounds.  No murmur heard. No BLE edema. Pulmonary/Chest: Effort normal and breath sounds normal. No respiratory distress. Abdominal: Soft. Bowel sounds are normal, no distension. There is no tenderness. no masses MALE GENITALIA: Normal descended testes bilaterally, no masses palpated, no hernias, no lesions, no discharge RECTAL: not done  Musculoskeletal: Normal range of motion, no joint effusions. No gross deformities Neurological: he is alert and  oriented to person, place, and time. No cranial nerve deficit. Coordination, balance, strength, speech and gait are normal.  Skin: Skin is warm and dry. No rash noted. No erythema.  Psychiatric: Patient has a normal mood and affect. behavior is normal. Judgment and thought content normal.     Assessment & Plan   1. Well adult exam (Primary)   2. Need for Tdap vaccination  - Tdap vaccine greater than or equal to 7yo IM    -Prostate cancer screening and PSA options (with potential risks and benefits of testing vs not testing) were discussed along with recent recs/guidelines. -USPSTF grade A and B recommendations reviewed with patient; age-appropriate recommendations, preventive care, screening tests, etc discussed and encouraged; healthy living encouraged; see AVS for patient education given to patient -Discussed importance of 150 minutes of physical activity weekly, eat two servings of fish weekly, eat one serving of tree nuts ( cashews, pistachios, pecans, almonds.SABRA) every other day, eat 6 servings of fruit/vegetables daily and drink plenty of water and avoid sweet beverages.  -Reviewed Health Maintenance: yes

## 2023-02-03 DIAGNOSIS — M9901 Segmental and somatic dysfunction of cervical region: Secondary | ICD-10-CM | POA: Diagnosis not present

## 2023-02-03 DIAGNOSIS — M51362 Other intervertebral disc degeneration, lumbar region with discogenic back pain and lower extremity pain: Secondary | ICD-10-CM | POA: Diagnosis not present

## 2023-02-03 DIAGNOSIS — M9903 Segmental and somatic dysfunction of lumbar region: Secondary | ICD-10-CM | POA: Diagnosis not present

## 2023-02-03 DIAGNOSIS — M9905 Segmental and somatic dysfunction of pelvic region: Secondary | ICD-10-CM | POA: Diagnosis not present

## 2023-02-24 DIAGNOSIS — M9905 Segmental and somatic dysfunction of pelvic region: Secondary | ICD-10-CM | POA: Diagnosis not present

## 2023-02-24 DIAGNOSIS — M9901 Segmental and somatic dysfunction of cervical region: Secondary | ICD-10-CM | POA: Diagnosis not present

## 2023-02-24 DIAGNOSIS — M9903 Segmental and somatic dysfunction of lumbar region: Secondary | ICD-10-CM | POA: Diagnosis not present

## 2023-02-24 DIAGNOSIS — M51362 Other intervertebral disc degeneration, lumbar region with discogenic back pain and lower extremity pain: Secondary | ICD-10-CM | POA: Diagnosis not present

## 2023-03-17 DIAGNOSIS — M9901 Segmental and somatic dysfunction of cervical region: Secondary | ICD-10-CM | POA: Diagnosis not present

## 2023-03-17 DIAGNOSIS — M9903 Segmental and somatic dysfunction of lumbar region: Secondary | ICD-10-CM | POA: Diagnosis not present

## 2023-03-17 DIAGNOSIS — M9905 Segmental and somatic dysfunction of pelvic region: Secondary | ICD-10-CM | POA: Diagnosis not present

## 2023-04-07 DIAGNOSIS — M9903 Segmental and somatic dysfunction of lumbar region: Secondary | ICD-10-CM | POA: Diagnosis not present

## 2023-04-07 DIAGNOSIS — M51362 Other intervertebral disc degeneration, lumbar region with discogenic back pain and lower extremity pain: Secondary | ICD-10-CM | POA: Diagnosis not present

## 2023-04-07 DIAGNOSIS — M9905 Segmental and somatic dysfunction of pelvic region: Secondary | ICD-10-CM | POA: Diagnosis not present

## 2023-04-07 DIAGNOSIS — M9901 Segmental and somatic dysfunction of cervical region: Secondary | ICD-10-CM | POA: Diagnosis not present

## 2023-04-28 ENCOUNTER — Ambulatory Visit: Payer: Self-pay | Admitting: Family Medicine

## 2023-04-28 ENCOUNTER — Encounter: Payer: Self-pay | Admitting: Family Medicine

## 2023-04-28 ENCOUNTER — Encounter: Payer: Self-pay | Admitting: Oncology

## 2023-04-28 DIAGNOSIS — I10 Essential (primary) hypertension: Secondary | ICD-10-CM | POA: Diagnosis not present

## 2023-04-28 DIAGNOSIS — E7800 Pure hypercholesterolemia, unspecified: Secondary | ICD-10-CM | POA: Diagnosis not present

## 2023-04-28 DIAGNOSIS — Z862 Personal history of diseases of the blood and blood-forming organs and certain disorders involving the immune mechanism: Secondary | ICD-10-CM

## 2023-04-28 DIAGNOSIS — E88819 Insulin resistance, unspecified: Secondary | ICD-10-CM | POA: Diagnosis not present

## 2023-04-28 DIAGNOSIS — E559 Vitamin D deficiency, unspecified: Secondary | ICD-10-CM

## 2023-04-28 DIAGNOSIS — Z6841 Body Mass Index (BMI) 40.0 and over, adult: Secondary | ICD-10-CM

## 2023-04-28 LAB — POCT GLYCOSYLATED HEMOGLOBIN (HGB A1C): Hemoglobin A1C: 5.9 % — AB (ref 4.0–5.6)

## 2023-04-28 MED ORDER — OLMESARTAN MEDOXOMIL-HCTZ 40-25 MG PO TABS: 1.0000 | ORAL_TABLET | Freq: Every day | ORAL | 1 refills | Status: DC

## 2023-04-28 MED ORDER — ROSUVASTATIN CALCIUM 5 MG PO TABS: 5.0000 mg | ORAL_TABLET | Freq: Every day | ORAL | 1 refills | Status: DC

## 2023-04-28 NOTE — Progress Notes (Signed)
 Name: Angel Chase   MRN: [id number]    DOB: 1965/12/23   Date:04/28/2023       Progress Note  Subjective  Chief Complaint  Chief Complaint  Patient presents with   Medical Management of Chronic Issues   HPI   Iron deficiency anemia due macroscopic hematuria: symptoms started over 20 years ago. He has seen  Urologist, hematologist and Nephrologist in the past . Last labs was good, normal HCT. He still has hematuria daily No fever or chills or dysuria . Last CBC was normal. He is no longer taking iron supplementation    IMPRESSION:03/02/2021    1. No hydronephrosis.  No renal, ureteral or bladder calculi. 2. Bilateral renal cysts.  No solid enhancing renal mass. 3. Cholelithiasis without findings of acute cholecystitis. 4. Left-sided colonic diverticulosis without findings of acute diverticulitis. 5. Moderate-sized fat containing ventral hernia.   HTN: he is taking medication, denies side effects No chest pain, dizziness or palpitation , we will send a refill and recheck labs today    Morbid Obesity:  he denies polyphagia, polydipsia or polyuria. He has HTN, dyslipidemia and insulin resistance, last A1C was 5.8 % , his BMI is over 40 . Unable to take  Metformin because it caused hematuria in the past. We sent Zepbound but not covered by insurance. He stopped drinking sodas and has been eating better and has lost 15 lbs in the past 6 months. He states he will start walking after work to lose more weight    Hyperlipidemia:he is back on Crestor 5 mg, we will recheck labs today    The 10-year ASCVD risk score (Arnett DK, et al., 2019) is: 9.8%   Values used to calculate the score:     Age: 58 years     Sex: Male     Is Non-Hispanic African American: Yes     Diabetic: No     Tobacco smoker: No     Systolic Blood Pressure: 124 mmHg     Is BP treated: Yes     HDL Cholesterol: 46 mg/dL     Total Cholesterol: 150 mg/dL    ED: long history , stable on Viagra ( Cialis causes side  effects) , takes it prn    Vitamin D deficiency: he is taking vitamin D 2000 units daily, last level was at goal . Continue supplementation    Umbilical hernia: no problems at this time,he was seen by surgeon but must lose weight prior to having surgery , insurance denied GLP-1 agonist but he has lost 15 lbs in the past 6 months  Cholelithiasis: reminded him about symptoms of strangulation  and when to [facility] to [facility].     Patient Active Problem List   Diagnosis Date Noted   Ventral hernia without obstruction or gangrene 03/26/2021   Calculus of gallbladder without cholecystitis without obstruction 03/26/2021   Umbilical hernia without obstruction and without gangrene 10/01/2020   Insulin resistance 07/26/2018   Recurrent hematuria 03/29/2018   Iron deficiency anemia due to chronic blood loss 03/29/2018   Pure hypercholesterolemia 03/29/2018   History of kidney stones 05/17/2017   Abnormal ECG 05/30/2016   History of iron deficiency anemia 04/28/2016   History of diverticulitis 03/04/2016   Hyperlipidemia 01/27/2015   Hematuria, gross 07/10/2014   Failure of erection 07/10/2014   Hypertension, benign 07/10/2014   Morbid obesity (HCC) 10/09/2013   Vitamin D deficiency 10/09/2013    Past Surgical History:  Procedure Laterality Date   ANKLE SURGERY Left  03/07/2011   fracture from trauma   COLONOSCOPY WITH PROPOFOL N/A 07/01/2016   Procedure: COLONOSCOPY WITH PROPOFOL;  Surgeon: Wyline Mood, MD;  Location: [facility] ENDOSCOPY;  Service: Endoscopy;  Laterality: N/A;   CYSTOSCOPY W/ RETROGRADES Bilateral 06/01/2016   Procedure: CYSTOSCOPY WITH RETROGRADE PYELOGRAM;  Surgeon: Hildred Laser, MD;  Location: [facility] ORS;  Service: Urology;  Laterality: Bilateral;   CYSTOSCOPY WITH STENT PLACEMENT Left 06/01/2016   Procedure: CYSTOSCOPY WITH STENT PLACEMENT;  Surgeon: Hildred Laser, MD;  Location: [facility] ORS;  Service: Urology;  Laterality: Left;   ESOPHAGOGASTRODUODENOSCOPY (EGD) WITH PROPOFOL  N/A 07/01/2016   Procedure: ESOPHAGOGASTRODUODENOSCOPY (EGD) WITH PROPOFOL;  Surgeon: Wyline Mood, MD;  Location: [facility] ENDOSCOPY;  Service: Endoscopy;  Laterality: N/A;   FLEXIBLE SIGMOIDOSCOPY N/A 02/24/2017   Procedure: FLEXIBLE SIGMOIDOSCOPY;  Surgeon: Wyline Mood, MD;  Location: [facility] ENDOSCOPY;  Service: Gastroenterology;  Laterality: N/A;   IR RADIOLOGIST EVAL & MGMT  06/16/2016   IR RENAL SELECTIVE  UNI INC S&I MOD SED  07/28/2016   IR US GUIDE VASC ACCESS LEFT  07/28/2016   STONE EXTRACTION WITH BASKET Left 06/01/2016   Procedure: STONE EXTRACTION WITH BASKET;  Surgeon: Hildred Laser, MD;  Location: [facility] ORS;  Service: Urology;  Laterality: Left;   TONSILLECTOMY     TONSILLECTOMY AND ADENOIDECTOMY  age 58   URETEROSCOPY Left 06/01/2016   Procedure: URETEROSCOPY;  Surgeon: Hildred Laser, MD;  Location: [facility] ORS;  Service: Urology;  Laterality: Left;    Family History  Problem Relation Age of Onset   Breast cancer Mother    Hypertension Mother    Diabetes Father    Hypertension Father    Hypertension Sister    Pancreatic disease Sister    Diabetes Sister    Hypertension Sister    Hypertension Brother    Diverticulosis Brother    Prostate cancer Maternal Uncle    Prostate cancer Maternal Uncle    Dementia Maternal Grandmother    Leukemia Maternal Grandfather    Heart disease Paternal Grandmother    Stomach cancer Paternal Grandfather    Kidney cancer Neg Hx    Bladder Cancer Neg Hx     Social History   Tobacco Use   Smoking status: Never   Smokeless tobacco: Never  Substance Use Topics   Alcohol use: No    Alcohol/week: 0.0 standard drinks of alcohol     Current Outpatient Medications:    Cholecalciferol (VITAMIN D) 50 MCG (2000 UT) CAPS, Take 1 capsule (2,000 Units total) by mouth daily., Disp: 30 capsule, Rfl: 11   Multiple Vitamin (MULTIVITAMIN WITH MINERALS) TABS tablet, Take 1 tablet by mouth daily at 6 (six) AM., Disp: , Rfl:     olmesartan-hydrochlorothiazide (BENICAR HCT) 40-25 MG tablet, Take 1 tablet by mouth daily. In place of micardis hctz due to insurance, Disp: 90 tablet, Rfl: 1   rosuvastatin (CRESTOR) 5 MG tablet, Take 1 tablet (5 mg total) by mouth daily., Disp: 90 tablet, Rfl: 1   sildenafil (VIAGRA) 100 MG tablet, Take 0.5-1 tablets (50-100 mg total) by mouth daily as needed for erectile dysfunction., Disp: 30 tablet, Rfl: 1   ferrous sulfate 325 (65 FE) MG EC tablet, Take 325 mg by mouth daily with breakfast. (0600 & 1530) (Patient not taking: Reported on 04/28/2023), Disp: , Rfl:    tirzepatide (ZEPBOUND) 2.5 MG/0.5ML injection vial, Inject 2.5 mg into the skin once a week. (Patient not taking: Reported on 04/28/2023), Disp: 2 mL, Rfl: 0  Allergies  Allergen  Reactions   Cialis [Tadalafil] Other (See Comments)    Joint aches     I personally reviewed active problem list, medication list, allergies with the patient/caregiver today.   ROS  Ten systems reviewed and is negative except as mentioned in HPI    Objective Physical Exam Constitutional: Patient appears well-developed and well-nourished. Obese  No distress.  HEENT: head atraumatic, normocephalic, pupils equal and reactive to light, neck supple Cardiovascular: Normal rate, regular rhythm and normal heart sounds.  No murmur heard. No BLE edema. Pulmonary/Chest: Effort normal and breath sounds normal. No respiratory distress. Abdominal: Soft.  There is no tenderness. Psychiatric: Patient has a normal mood and affect. behavior is normal. Judgment and thought content normal.   Vitals:   04/28/23 0807  BP: 124/82  Pulse: 89  Resp: 16  SpO2: 96%  Weight: (!) 329 lb 8 oz (149.5 kg)  Height: 5\' 11"  (1.803 m)    Body mass index is 45.96 kg/m.  Recent Results (from the past 2160 hours)  POCT glycosylated hemoglobin (Hb A1C)     Status: Abnormal   Collection Time: 04/28/23  8:22 AM  Result Value Ref Range   Hemoglobin A1C 5.9 (A) 4.0 - 5.6 %    HbA1c POC (<> result, manual entry)     HbA1c, POC (prediabetic range)     HbA1c, POC (controlled diabetic range)        PHQ2/9:    04/28/2023    7:58 AM 01/27/2023    8:00 AM 11/04/2022    8:32 AM 04/29/2022   10:08 AM 01/21/2022    8:18 AM  Depression screen PHQ 2/9  Decreased Interest 0 0 0 0 0  Down, Depressed, Hopeless 0 0 0 0 0  PHQ - 2 Score 0 0 0 0 0  Altered sleeping 0 0 0 0 0  Tired, decreased energy 0 0 0 0 0  Change in appetite 0 0 0 0 0  Feeling bad or failure about yourself  0 0 0 0 0  Trouble concentrating 0 0 0 0 0  Moving slowly or fidgety/restless 0 0 0 0 0  Suicidal thoughts 0 0 0 0 0  PHQ-9 Score 0 0 0 0 0  Difficult doing work/chores Not difficult at all Not difficult at all       phq 9 is negative  Fall Risk:    01/27/2023    8:00 AM 11/04/2022    8:32 AM 04/29/2022   10:08 AM 01/21/2022    8:18 AM 10/22/2021    9:41 AM  Fall Risk   Falls in the past year? 0 0 0 1 0  Number falls in past yr: 0   0 0  Injury with Fall? 0   0 0  Risk for fall due to : No Fall Risks No Fall Risks No Fall Risks No Fall Risks No Fall Risks  Follow up Falls prevention discussed;Education provided;Falls evaluation completed Falls prevention discussed Falls prevention discussed Falls prevention discussed Falls prevention discussed     Assessment & Plan  1. Morbid obesity (HCC) (Primary)  Discussed with the patient the risk posed by an increased BMI. Discussed importance of portion control, calorie counting and at least 150 minutes of physical activity weekly. Avoid sweet beverages and drink more water. Eat at least 6 servings of fruit and vegetables daily    2. Insulin resistance  - POCT glycosylated hemoglobin (Hb A1C)  3. Essential hypertension  - EKG 12-Lead - normal sinus rhythm  -  olmesartan-hydrochlorothiazide (BENICAR HCT) 40-25 MG tablet; Take 1 tablet by mouth daily. In place of micardis hctz due to insurance  Dispense: 90 tablet; Refill: 1  4. Pure  hypercholesterolemia  - EKG 12-Lead - rosuvastatin (CRESTOR) 5 MG tablet; Take 1 tablet (5 mg total) by mouth daily.  Dispense: 90 tablet; Refill: 1  5. History of iron deficiency anemia   6. Vitamin D deficiency  Continue supplementation

## 2023-05-12 DIAGNOSIS — M51362 Other intervertebral disc degeneration, lumbar region with discogenic back pain and lower extremity pain: Secondary | ICD-10-CM | POA: Diagnosis not present

## 2023-05-12 DIAGNOSIS — M9903 Segmental and somatic dysfunction of lumbar region: Secondary | ICD-10-CM | POA: Diagnosis not present

## 2023-05-12 DIAGNOSIS — M9901 Segmental and somatic dysfunction of cervical region: Secondary | ICD-10-CM | POA: Diagnosis not present

## 2023-05-12 DIAGNOSIS — M9905 Segmental and somatic dysfunction of pelvic region: Secondary | ICD-10-CM | POA: Diagnosis not present

## 2023-06-09 DIAGNOSIS — M9901 Segmental and somatic dysfunction of cervical region: Secondary | ICD-10-CM | POA: Diagnosis not present

## 2023-06-09 DIAGNOSIS — M51362 Other intervertebral disc degeneration, lumbar region with discogenic back pain and lower extremity pain: Secondary | ICD-10-CM | POA: Diagnosis not present

## 2023-06-09 DIAGNOSIS — M9903 Segmental and somatic dysfunction of lumbar region: Secondary | ICD-10-CM | POA: Diagnosis not present

## 2023-06-09 DIAGNOSIS — M9905 Segmental and somatic dysfunction of pelvic region: Secondary | ICD-10-CM | POA: Diagnosis not present

## 2023-06-23 DIAGNOSIS — M9903 Segmental and somatic dysfunction of lumbar region: Secondary | ICD-10-CM | POA: Diagnosis not present

## 2023-06-23 DIAGNOSIS — M9901 Segmental and somatic dysfunction of cervical region: Secondary | ICD-10-CM | POA: Diagnosis not present

## 2023-06-23 DIAGNOSIS — M9905 Segmental and somatic dysfunction of pelvic region: Secondary | ICD-10-CM | POA: Diagnosis not present

## 2023-06-23 DIAGNOSIS — M51362 Other intervertebral disc degeneration, lumbar region with discogenic back pain and lower extremity pain: Secondary | ICD-10-CM | POA: Diagnosis not present

## 2023-07-28 DIAGNOSIS — M9903 Segmental and somatic dysfunction of lumbar region: Secondary | ICD-10-CM | POA: Diagnosis not present

## 2023-07-28 DIAGNOSIS — M9901 Segmental and somatic dysfunction of cervical region: Secondary | ICD-10-CM | POA: Diagnosis not present

## 2023-07-28 DIAGNOSIS — M51362 Other intervertebral disc degeneration, lumbar region with discogenic back pain and lower extremity pain: Secondary | ICD-10-CM | POA: Diagnosis not present

## 2023-07-28 DIAGNOSIS — M9905 Segmental and somatic dysfunction of pelvic region: Secondary | ICD-10-CM | POA: Diagnosis not present

## 2023-08-14 DIAGNOSIS — M9903 Segmental and somatic dysfunction of lumbar region: Secondary | ICD-10-CM | POA: Diagnosis not present

## 2023-08-14 DIAGNOSIS — M9901 Segmental and somatic dysfunction of cervical region: Secondary | ICD-10-CM | POA: Diagnosis not present

## 2023-08-14 DIAGNOSIS — M9905 Segmental and somatic dysfunction of pelvic region: Secondary | ICD-10-CM | POA: Diagnosis not present

## 2023-09-04 DIAGNOSIS — M9903 Segmental and somatic dysfunction of lumbar region: Secondary | ICD-10-CM | POA: Diagnosis not present

## 2023-09-04 DIAGNOSIS — M51362 Other intervertebral disc degeneration, lumbar region with discogenic back pain and lower extremity pain: Secondary | ICD-10-CM | POA: Diagnosis not present

## 2023-09-04 DIAGNOSIS — M9905 Segmental and somatic dysfunction of pelvic region: Secondary | ICD-10-CM | POA: Diagnosis not present

## 2023-09-04 DIAGNOSIS — M9901 Segmental and somatic dysfunction of cervical region: Secondary | ICD-10-CM | POA: Diagnosis not present

## 2023-09-29 DIAGNOSIS — M9905 Segmental and somatic dysfunction of pelvic region: Secondary | ICD-10-CM | POA: Diagnosis not present

## 2023-09-29 DIAGNOSIS — M9903 Segmental and somatic dysfunction of lumbar region: Secondary | ICD-10-CM | POA: Diagnosis not present

## 2023-09-29 DIAGNOSIS — M51362 Other intervertebral disc degeneration, lumbar region with discogenic back pain and lower extremity pain: Secondary | ICD-10-CM | POA: Diagnosis not present

## 2023-09-29 DIAGNOSIS — M9901 Segmental and somatic dysfunction of cervical region: Secondary | ICD-10-CM | POA: Diagnosis not present

## 2023-10-13 DIAGNOSIS — M51362 Other intervertebral disc degeneration, lumbar region with discogenic back pain and lower extremity pain: Secondary | ICD-10-CM | POA: Diagnosis not present

## 2023-10-13 DIAGNOSIS — M9905 Segmental and somatic dysfunction of pelvic region: Secondary | ICD-10-CM | POA: Diagnosis not present

## 2023-10-13 DIAGNOSIS — M9903 Segmental and somatic dysfunction of lumbar region: Secondary | ICD-10-CM | POA: Diagnosis not present

## 2023-10-13 DIAGNOSIS — M9901 Segmental and somatic dysfunction of cervical region: Secondary | ICD-10-CM | POA: Diagnosis not present

## 2023-10-27 ENCOUNTER — Other Ambulatory Visit (HOSPITAL_COMMUNITY): Payer: Self-pay

## 2023-10-27 ENCOUNTER — Ambulatory Visit: Admitting: Family Medicine

## 2023-10-27 ENCOUNTER — Telehealth: Payer: Self-pay | Admitting: Pharmacy Technician

## 2023-10-27 ENCOUNTER — Encounter: Payer: Self-pay | Admitting: Oncology

## 2023-10-27 ENCOUNTER — Encounter: Payer: Self-pay | Admitting: Family Medicine

## 2023-10-27 DIAGNOSIS — Z23 Encounter for immunization: Secondary | ICD-10-CM

## 2023-10-27 DIAGNOSIS — Z125 Encounter for screening for malignant neoplasm of prostate: Secondary | ICD-10-CM

## 2023-10-27 DIAGNOSIS — N029 Recurrent and persistent hematuria with unspecified morphologic changes: Secondary | ICD-10-CM

## 2023-10-27 DIAGNOSIS — Z862 Personal history of diseases of the blood and blood-forming organs and certain disorders involving the immune mechanism: Secondary | ICD-10-CM

## 2023-10-27 DIAGNOSIS — E559 Vitamin D deficiency, unspecified: Secondary | ICD-10-CM

## 2023-10-27 DIAGNOSIS — I10 Essential (primary) hypertension: Secondary | ICD-10-CM | POA: Diagnosis not present

## 2023-10-27 DIAGNOSIS — E88819 Insulin resistance, unspecified: Secondary | ICD-10-CM | POA: Diagnosis not present

## 2023-10-27 DIAGNOSIS — Z6841 Body Mass Index (BMI) 40.0 and over, adult: Secondary | ICD-10-CM

## 2023-10-27 DIAGNOSIS — E7800 Pure hypercholesterolemia, unspecified: Secondary | ICD-10-CM

## 2023-10-27 DIAGNOSIS — Z1159 Encounter for screening for other viral diseases: Secondary | ICD-10-CM

## 2023-10-27 MED ORDER — ROSUVASTATIN CALCIUM 10 MG PO TABS: 10.0000 mg | ORAL_TABLET | Freq: Every day | ORAL | 1 refills | Status: AC

## 2023-10-27 MED ORDER — OLMESARTAN MEDOXOMIL-HCTZ 40-25 MG PO TABS: 1.0000 | ORAL_TABLET | Freq: Every day | ORAL | 1 refills | Status: AC

## 2023-10-27 MED ORDER — PHENTERMINE-TOPIRAMATE ER 3.75-23 MG PO CP24: 1.0000 | ORAL_CAPSULE | Freq: Every morning | ORAL | 0 refills | Status: DC

## 2023-10-27 MED ORDER — PHENTERMINE-TOPIRAMATE ER 7.5-46 MG PO CP24: 1.0000 | ORAL_CAPSULE | Freq: Every morning | ORAL | 1 refills | Status: DC

## 2023-10-27 NOTE — Telephone Encounter (Signed)
 Left detailed vm

## 2023-10-27 NOTE — Telephone Encounter (Signed)
 Pharmacy Patient Advocate Encounter   Received notification from CoverMyMeds that prior authorization for Phentermine-Topiramate ER 3.75-23MG  er capsules is required/requested.   Insurance verification completed.   The patient is insured through [employer].   Per test claim: Per test claim, medication is not covered due to plan/benefit exclusion, PA not submitted at this time

## 2023-10-27 NOTE — Progress Notes (Signed)
 Name: Angel Chase   MRN: [id number]    DOB: 1965-04-01   Date:10/27/2023       Progress Note  Subjective  Chief Complaint  Chief Complaint  Patient presents with   Medical Management of Chronic Issues   Discussed the use of AI scribe software for clinical note transcription with the patient, who gave verbal consent to proceed.  History of Present Illness Angel Chase is a 58 year old male who presents for a six-month follow-up visit.  He has gained weight from 329 lbs to 341 lbs over the past six months, attributing this to stress at work leading to stress eating. He struggles with portion control but has successfully avoided sodas for two years.  He has a history of prediabetes and insulin  resistance. Previously, he tried metformin  but discontinued it due to hematuria, which resolved after stopping the medication. No recent episodes of hematuria have occurred.  He is currently taking olmesartan  for hypertension, with a recent blood pressure reading of 126/80 mmHg. No side effects such as chest pain or palpitations are reported. He also takes a low dose of rosuvastatin  (5 mg) for high cholesterol; his LDL was 81 mg/dL last year.  He experiences stress at work, which affects his eating habits, and acknowledges the need to manage stress better to avoid weight gain.  He has a history of iron deficiency anemia related to previous episodes of hematuria.  No current issues with plantar fasciitis, which he manages with regular chiropractic adjustments every other week.    Patient Active Problem List   Diagnosis Date Noted   Ventral hernia without obstruction or gangrene 03/26/2021   Calculus of gallbladder without cholecystitis without obstruction 03/26/2021   Umbilical hernia without obstruction and without gangrene 10/01/2020   Insulin  resistance 07/26/2018   Recurrent hematuria 03/29/2018   Iron deficiency anemia due to chronic blood loss 03/29/2018   Pure hypercholesterolemia  03/29/2018   History of kidney stones 05/17/2017   Abnormal ECG 05/30/2016   History of iron deficiency anemia 04/28/2016   History of diverticulitis 03/04/2016   Hyperlipidemia 01/27/2015   Hematuria, gross 07/10/2014   Failure of erection 07/10/2014   Hypertension, benign 07/10/2014   Morbid obesity (HCC) 10/09/2013   Vitamin D  deficiency 10/09/2013    Past Surgical History:  Procedure Laterality Date   ANKLE SURGERY Left 03/07/2011   fracture from trauma   COLONOSCOPY WITH PROPOFOL  N/A 07/01/2016   Procedure: COLONOSCOPY WITH PROPOFOL ;  Surgeon: Therisa Bi, MD;  Location: [facility] ENDOSCOPY;  Service: Endoscopy;  Laterality: N/A;   CYSTOSCOPY W/ RETROGRADES Bilateral 06/01/2016   Procedure: CYSTOSCOPY WITH RETROGRADE PYELOGRAM;  Surgeon: Chauncey Redell Agent, MD;  Location: [facility] ORS;  Service: Urology;  Laterality: Bilateral;   CYSTOSCOPY WITH STENT PLACEMENT Left 06/01/2016   Procedure: CYSTOSCOPY WITH STENT PLACEMENT;  Surgeon: Chauncey Redell Agent, MD;  Location: [facility] ORS;  Service: Urology;  Laterality: Left;   ESOPHAGOGASTRODUODENOSCOPY (EGD) WITH PROPOFOL  N/A 07/01/2016   Procedure: ESOPHAGOGASTRODUODENOSCOPY (EGD) WITH PROPOFOL ;  Surgeon: Therisa Bi, MD;  Location: [facility] ENDOSCOPY;  Service: Endoscopy;  Laterality: N/A;   FLEXIBLE SIGMOIDOSCOPY N/A 02/24/2017   Procedure: FLEXIBLE SIGMOIDOSCOPY;  Surgeon: Therisa Bi, MD;  Location: [facility] ENDOSCOPY;  Service: Gastroenterology;  Laterality: N/A;   IR RADIOLOGIST EVAL & MGMT  06/16/2016   IR RENAL SELECTIVE  UNI INC S&I MOD SED  07/28/2016   IR US  GUIDE VASC ACCESS LEFT  07/28/2016   STONE EXTRACTION WITH BASKET Left 06/01/2016   Procedure: STONE EXTRACTION WITH BASKET;  Surgeon: Chauncey Redell Agent, MD;  Location: [facility] ORS;  Service: Urology;  Laterality: Left;   TONSILLECTOMY     TONSILLECTOMY AND ADENOIDECTOMY  age 86   URETEROSCOPY Left 06/01/2016   Procedure: URETEROSCOPY;  Surgeon: Chauncey Redell Agent, MD;  Location: [facility] ORS;   Service: Urology;  Laterality: Left;    Family History  Problem Relation Age of Onset   Breast cancer Mother    Hypertension Mother    Diabetes Father    Hypertension Father    Hypertension Sister    Pancreatic disease Sister    Diabetes Sister    Hypertension Sister    Hypertension Brother    Diverticulosis Brother    Prostate cancer Maternal Uncle    Prostate cancer Maternal Uncle    Dementia Maternal Grandmother    Leukemia Maternal Grandfather    Heart disease Paternal Grandmother    Stomach cancer Paternal Grandfather    Kidney cancer Neg Hx    Bladder Cancer Neg Hx     Social History   Tobacco Use   Smoking status: Never   Smokeless tobacco: Never  Substance Use Topics   Alcohol use: No    Alcohol/week: 0.0 standard drinks of alcohol     Current Outpatient Medications:    Cholecalciferol (VITAMIN D ) 50 MCG (2000 UT) CAPS, Take 1 capsule (2,000 Units total) by mouth daily., Disp: 30 capsule, Rfl: 11   Multiple Vitamin (MULTIVITAMIN WITH MINERALS) TABS tablet, Take 1 tablet by mouth daily at 6 (six) AM., Disp: , Rfl:    olmesartan -hydrochlorothiazide (BENICAR  HCT) 40-25 MG tablet, Take 1 tablet by mouth daily. In place of micardis  hctz due to insurance, Disp: 90 tablet, Rfl: 1   rosuvastatin  (CRESTOR ) 5 MG tablet, Take 1 tablet (5 mg total) by mouth daily., Disp: 90 tablet, Rfl: 1   sildenafil  (VIAGRA ) 100 MG tablet, Take 0.5-1 tablets (50-100 mg total) by mouth daily as needed for erectile dysfunction., Disp: 30 tablet, Rfl: 1  Allergies  Allergen Reactions   Cialis  Ilium.Henderson ] Other (See Comments)    Joint aches     I personally reviewed active problem list, medication list, allergies with the patient/caregiver today.   ROS  Ten systems reviewed and is negative except as mentioned in HPI    Objective Physical Exam VITALS: BP- 126/80 MEASUREMENTS: Weight- 341. CONSTITUTIONAL: Patient appears well-developed and well-nourished.  No distress. HEENT: Head  atraumatic, normocephalic, neck supple. CARDIOVASCULAR: Normal rate, regular rhythm and normal heart sounds.  No murmur heard. No BLE edema. PULMONARY: Effort normal and breath sounds normal. No respiratory distress. ABDOMINAL: There is no tenderness or distention. MUSCULOSKELETAL: Normal gait. Without gross motor or sensory deficit. PSYCHIATRIC: Patient has a normal mood and affect. behavior is normal. Judgment and thought content normal.  Vitals:   10/27/23 0812  BP: 126/80  Pulse: 92  Resp: 16  SpO2: 98%  Weight: (!) 341 lb 8 oz (154.9 kg)  Height: 5' 11 (1.803 m)    Body mass index is 47.63 kg/m.   PHQ2/9:    10/27/2023    8:08 AM 04/28/2023    7:58 AM 01/27/2023    8:00 AM 11/04/2022    8:32 AM 04/29/2022   10:08 AM  Depression screen PHQ 2/9  Decreased Interest 0 0 0 0 0  Down, Depressed, Hopeless 0 0 0 0 0  PHQ - 2 Score 0 0 0 0 0  Altered sleeping  0 0 0 0  Tired, decreased energy  0 0 0 0  Change  in appetite  0 0 0 0  Feeling bad or failure about yourself   0 0 0 0  Trouble concentrating  0 0 0 0  Moving slowly or fidgety/restless  0 0 0 0  Suicidal thoughts  0 0 0 0  PHQ-9 Score  0 0 0 0  Difficult doing work/chores  Not difficult at all Not difficult at all      phq 9 is negative  Fall Risk:    10/27/2023    8:08 AM 01/27/2023    8:00 AM 11/04/2022    8:32 AM 04/29/2022   10:08 AM 01/21/2022    8:18 AM  Fall Risk   Falls in the past year? 0 0 0 0 1  Number falls in past yr: 0 0   0  Injury with Fall? 0 0   0  Risk for fall due to : No Fall Risks No Fall Risks No Fall Risks No Fall Risks No Fall Risks  Follow up Falls evaluation completed Falls prevention discussed;Education provided;Falls evaluation completed Falls prevention discussed Falls prevention discussed Falls prevention discussed      Data saved with a previous flowsheet row definition      Assessment & Plan Morbid obesity due to excess calories Morbid obesity with BMI >40, increasing  risk for diabetes, hypertension, and hypercholesterolemia. Weight increased from 329 lbs to 341 lbs since last visit. Stress-related eating identified as a contributing factor. - Prescribe Qsymia, starting with a low dose and increasing to 7.5/46 mg, one capsule QAM. - Monitor blood pressure at home due to potential increase from Qsymia. - Encourage portion control and healthier food choices. - Advise on stress management techniques, such as walking or weight lifting, instead of eating. - Schedule follow-up in 3 months to assess weight and blood pressure.  Essential hypertension Blood pressure well-controlled at 126/80 mmHg on current regimen of olmesartan  and hydrochlorothiazide.  Insulin  resistance (prediabetes) Insulin  resistance with history of elevated blood sugar and abdominal girth. Previous intolerance to metformin  due to hematuria. - Advise on reducing carbohydrate intake, focusing on balanced meals with limited starches. - Encourage weight loss to improve insulin  sensitivity.  Pure hypercholesterolemia Hypercholesterolemia with LDL increased from 76 to 81 mg/dL. Currently on low dose rosuvastatin  5 mg without side effects. - Increase rosuvastatin  to 10 mg daily. - Provide refills for 6 months.  History of iron deficiency anemia and hematuria Iron deficiency anemia and hematuria, with previous episodes of anemia due to bleeding. Hematuria recurred with metformin  use. - Order CBC, total iron, TIBC, and ferritin to assess current iron status.  Vitamin D  deficiency Vitamin D  deficiency managed with over-the-counter supplementation.  General Health Maintenance - Received flu vaccination. - Schedule PSA screening after October 18th due to family history of prostate cancer and insurance coverage timing. - Check hepatitis B surface antibodies to determine vaccination status.

## 2023-11-03 DIAGNOSIS — M9901 Segmental and somatic dysfunction of cervical region: Secondary | ICD-10-CM | POA: Diagnosis not present

## 2023-11-03 DIAGNOSIS — M9905 Segmental and somatic dysfunction of pelvic region: Secondary | ICD-10-CM | POA: Diagnosis not present

## 2023-11-03 DIAGNOSIS — M51362 Other intervertebral disc degeneration, lumbar region with discogenic back pain and lower extremity pain: Secondary | ICD-10-CM | POA: Diagnosis not present

## 2023-11-08 DIAGNOSIS — E88819 Insulin resistance, unspecified: Secondary | ICD-10-CM | POA: Diagnosis not present

## 2023-11-08 DIAGNOSIS — Z125 Encounter for screening for malignant neoplasm of prostate: Secondary | ICD-10-CM | POA: Diagnosis not present

## 2023-11-08 DIAGNOSIS — Z862 Personal history of diseases of the blood and blood-forming organs and certain disorders involving the immune mechanism: Secondary | ICD-10-CM | POA: Diagnosis not present

## 2023-11-08 DIAGNOSIS — E7800 Pure hypercholesterolemia, unspecified: Secondary | ICD-10-CM | POA: Diagnosis not present

## 2023-11-09 LAB — CBC WITH DIFFERENTIAL/PLATELET
Absolute Lymphocytes: 1759 {cells}/uL (ref 850–3900)
Absolute Monocytes: 803 {cells}/uL (ref 200–950)
Basophils Absolute: 80 {cells}/uL (ref 0–200)
Basophils Relative: 1.1 %
Eosinophils Absolute: 37 {cells}/uL (ref 15–500)
Eosinophils Relative: 0.5 %
HCT: 47.3 % (ref 38.5–50.0)
Hemoglobin: 15.3 g/dL (ref 13.2–17.1)
MCH: 27.9 pg (ref 27.0–33.0)
MCHC: 32.3 g/dL (ref 32.0–36.0)
MCV: 86.3 fL (ref 80.0–100.0)
MPV: 10.9 fL (ref 7.5–12.5)
Monocytes Relative: 11 %
Neutro Abs: 4621 {cells}/uL (ref 1500–7800)
Neutrophils Relative %: 63.3 %
Platelets: 212 Thousand/uL (ref 140–400)
RBC: 5.48 Million/uL (ref 4.20–5.80)
RDW: 13.3 % (ref 11.0–15.0)
Total Lymphocyte: 24.1 %
WBC: 7.3 Thousand/uL (ref 3.8–10.8)

## 2023-11-09 LAB — HEPATITIS B SURFACE ANTIBODY,QUALITATIVE: Hep B S Ab: NONREACTIVE

## 2023-11-09 LAB — LIPID PANEL
Cholesterol: 154 mg/dL (ref <200)
HDL: 51 mg/dL (ref >=40)
LDL Cholesterol (Calc): 84 mg/dL
Non-HDL Cholesterol (Calc): 103 mg/dL (ref <130)
Total CHOL/HDL Ratio: 3 (calc) (ref <5.0)
Triglycerides: 91 mg/dL (ref <150)

## 2023-11-09 LAB — HEMOGLOBIN A1C
Hgb A1c MFr Bld: 5.8 % — ABNORMAL HIGH (ref <5.7)
Mean Plasma Glucose: 120 mg/dL
eAG (mmol/L): 6.6 mmol/L

## 2023-11-09 LAB — COMPREHENSIVE METABOLIC PANEL WITH GFR
AG Ratio: 1.7 (calc) (ref 1.0–2.5)
ALT: 22 U/L (ref 9–46)
AST: 16 U/L (ref 10–35)
Albumin: 4.5 g/dL (ref 3.6–5.1)
Alkaline phosphatase (APISO): 75 U/L (ref 35–144)
BUN: 15 mg/dL (ref 7–25)
CO2: 28 mmol/L (ref 20–32)
Calcium: 10.1 mg/dL (ref 8.6–10.3)
Chloride: 102 mmol/L (ref 98–110)
Creat: 1.09 mg/dL (ref 0.70–1.30)
Globulin: 2.6 g/dL (ref 1.9–3.7)
Glucose, Bld: 102 mg/dL — ABNORMAL HIGH (ref 65–99)
Potassium: 4.2 mmol/L (ref 3.5–5.3)
Sodium: 139 mmol/L (ref 135–146)
Total Bilirubin: 0.4 mg/dL (ref 0.2–1.2)
Total Protein: 7.1 g/dL (ref 6.1–8.1)
eGFR: 80 mL/min/1.73m2 (ref >=60)

## 2023-11-09 LAB — IRON,TIBC AND FERRITIN PANEL
%SAT: 19 % — ABNORMAL LOW (ref 20–48)
Ferritin: 147 ng/mL (ref 38–380)
Iron: 60 ug/dL (ref 50–180)
TIBC: 324 ug/dL (ref 250–425)

## 2023-11-09 LAB — PSA: PSA: 0.55 ng/mL (ref <=4.00)

## 2023-11-14 ENCOUNTER — Ambulatory Visit: Payer: Self-pay | Admitting: Family Medicine

## 2023-11-17 DIAGNOSIS — M9905 Segmental and somatic dysfunction of pelvic region: Secondary | ICD-10-CM | POA: Diagnosis not present

## 2023-11-17 DIAGNOSIS — M9903 Segmental and somatic dysfunction of lumbar region: Secondary | ICD-10-CM | POA: Diagnosis not present

## 2023-11-17 DIAGNOSIS — M9901 Segmental and somatic dysfunction of cervical region: Secondary | ICD-10-CM | POA: Diagnosis not present

## 2024-01-05 DIAGNOSIS — M9903 Segmental and somatic dysfunction of lumbar region: Secondary | ICD-10-CM | POA: Diagnosis not present

## 2024-01-05 DIAGNOSIS — M51362 Other intervertebral disc degeneration, lumbar region with discogenic back pain and lower extremity pain: Secondary | ICD-10-CM | POA: Diagnosis not present

## 2024-01-05 DIAGNOSIS — M9905 Segmental and somatic dysfunction of pelvic region: Secondary | ICD-10-CM | POA: Diagnosis not present

## 2024-01-05 DIAGNOSIS — M9901 Segmental and somatic dysfunction of cervical region: Secondary | ICD-10-CM | POA: Diagnosis not present

## 2024-01-29 ENCOUNTER — Encounter: Payer: Self-pay | Admitting: Family Medicine

## 2024-01-29 ENCOUNTER — Ambulatory Visit: Payer: Self-pay | Admitting: Family Medicine

## 2024-01-29 DIAGNOSIS — E7800 Pure hypercholesterolemia, unspecified: Secondary | ICD-10-CM | POA: Diagnosis not present

## 2024-01-29 DIAGNOSIS — I10 Essential (primary) hypertension: Secondary | ICD-10-CM

## 2024-01-29 DIAGNOSIS — E88819 Insulin resistance, unspecified: Secondary | ICD-10-CM

## 2024-01-29 DIAGNOSIS — Z6841 Body Mass Index (BMI) 40.0 and over, adult: Secondary | ICD-10-CM | POA: Diagnosis not present

## 2024-01-29 DIAGNOSIS — Z23 Encounter for immunization: Secondary | ICD-10-CM | POA: Diagnosis not present

## 2024-01-29 MED ORDER — PHENTERMINE-TOPIRAMATE ER 7.5-46 MG PO CP24: 1.0000 | ORAL_CAPSULE | Freq: Every morning | ORAL | 2 refills | Status: AC

## 2024-01-29 NOTE — Progress Notes (Signed)
 Name: Angel Chase   MRN: [id number]    DOB: 1965/01/21   Date:01/29/2024       Progress Note  Subjective  Chief Complaint  Chief Complaint  Patient presents with   Medical Management of Chronic Issues   Discussed the use of AI scribe software for clinical note transcription with the patient, who gave verbal consent to proceed.  History of Present Illness Angel Chase is a 59 year old male who presents for follow-up on weight management and medication review.  He has morbid obesity and experienced a significant weight loss of approximately 21 pounds over two months while on Qsymia , a weight loss medication. Initially weighing 342 pounds in October, he reduced to 321 pounds. However, he has since regained some weight, currently weighing 333 pounds, which he attributes to the holiday season and being off the medication.   He was initially prescribed Qsymia  at a dose of 3.75 mg, followed by an increase to 7.5 mg. The medication initially helped with appetite control, but its effectiveness seemed to diminish over time. He ran out of the medication and was unaware of having additional refills. He prefers continuing at the higher dose, as he did not notice significant side effects, and it did not affect his sleep or cause any discomfort.  In terms of lifestyle, he does not consume soda and has been making better dietary choices, such as having smaller portions and one main meal a day. He is physically active at work and continues to drink water regularly.  Regarding his blood pressure, he is unsure if it has increased, as he has not checked it recently. His wife, who is a engineer, civil (consulting), can assist in monitoring his blood pressure at home. No chest pain or palpitations. He takes Benicar  hydrochlorothiazide 40/25 mg daily   He is currently taking rosuvastatin  10 mg daily for dyslipidemia and denies side effects, last LDL below 100   Pre diabetes, on life style modification     Patient Active Problem  List   Diagnosis Date Noted   Ventral hernia without obstruction or gangrene 03/26/2021   Calculus of gallbladder without cholecystitis without obstruction 03/26/2021   Umbilical hernia without obstruction and without gangrene 10/01/2020   Insulin  resistance 07/26/2018   Recurrent hematuria 03/29/2018   Iron deficiency anemia due to chronic blood loss 03/29/2018   Pure hypercholesterolemia 03/29/2018   History of kidney stones 05/17/2017   Abnormal ECG 05/30/2016   History of iron deficiency anemia 04/28/2016   History of diverticulitis 03/04/2016   Hyperlipidemia 01/27/2015   Hematuria, gross 07/10/2014   Failure of erection 07/10/2014   Hypertension, benign 07/10/2014   Morbid obesity (HCC) 10/09/2013   Vitamin D  deficiency 10/09/2013    Past Surgical History:  Procedure Laterality Date   ANKLE SURGERY Left 03/07/2011   fracture from trauma   COLONOSCOPY WITH PROPOFOL  N/A 07/01/2016   Procedure: COLONOSCOPY WITH PROPOFOL ;  Surgeon: Therisa Bi, MD;  Location: [facility] ENDOSCOPY;  Service: Endoscopy;  Laterality: N/A;   CYSTOSCOPY W/ RETROGRADES Bilateral 06/01/2016   Procedure: CYSTOSCOPY WITH RETROGRADE PYELOGRAM;  Surgeon: Chauncey Redell Agent, MD;  Location: [facility] ORS;  Service: Urology;  Laterality: Bilateral;   CYSTOSCOPY WITH STENT PLACEMENT Left 06/01/2016   Procedure: CYSTOSCOPY WITH STENT PLACEMENT;  Surgeon: Chauncey Redell Agent, MD;  Location: [facility] ORS;  Service: Urology;  Laterality: Left;   ESOPHAGOGASTRODUODENOSCOPY (EGD) WITH PROPOFOL  N/A 07/01/2016   Procedure: ESOPHAGOGASTRODUODENOSCOPY (EGD) WITH PROPOFOL ;  Surgeon: Therisa Bi, MD;  Location: [facility] ENDOSCOPY;  Service: Endoscopy;  Laterality: N/A;   FLEXIBLE SIGMOIDOSCOPY N/A 02/24/2017   Procedure: FLEXIBLE SIGMOIDOSCOPY;  Surgeon: Therisa Bi, MD;  Location: [facility] ENDOSCOPY;  Service: Gastroenterology;  Laterality: N/A;   IR RADIOLOGIST EVAL & MGMT  06/16/2016   IR RENAL SELECTIVE  UNI INC S&I MOD SED  07/28/2016   IR US   GUIDE VASC ACCESS LEFT  07/28/2016   STONE EXTRACTION WITH BASKET Left 06/01/2016   Procedure: STONE EXTRACTION WITH BASKET;  Surgeon: Chauncey Redell Agent, MD;  Location: [facility] ORS;  Service: Urology;  Laterality: Left;   TONSILLECTOMY     TONSILLECTOMY AND ADENOIDECTOMY  age 64   URETEROSCOPY Left 06/01/2016   Procedure: URETEROSCOPY;  Surgeon: Chauncey Redell Agent, MD;  Location: [facility] ORS;  Service: Urology;  Laterality: Left;    Family History  Problem Relation Age of Onset   Breast cancer Mother    Hypertension Mother    Diabetes Father    Hypertension Father    Hypertension Sister    Pancreatic disease Sister    Diabetes Sister    Hypertension Sister    Hypertension Brother    Diverticulosis Brother    Prostate cancer Maternal Uncle    Prostate cancer Maternal Uncle    Dementia Maternal Grandmother    Leukemia Maternal Grandfather    Heart disease Paternal Grandmother    Stomach cancer Paternal Grandfather    Kidney cancer Neg Hx    Bladder Cancer Neg Hx     Social History   Tobacco Use   Smoking status: Never   Smokeless tobacco: Never  Substance Use Topics   Alcohol use: No    Alcohol/week: 0.0 standard drinks of alcohol    Current Medications[1]  Allergies[2]  I personally reviewed active problem list, medication list, allergies, family history, social history with the patient/caregiver today.   ROS  Ten systems reviewed and is negative except as mentioned in HPI    Objective Physical Exam  CONSTITUTIONAL: Patient appears well-developed and well-nourished.  No distress. HEENT: Head atraumatic, normocephalic, neck supple. CARDIOVASCULAR: Normal rate, regular rhythm and normal heart sounds.  No murmur heard. No BLE edema. PULMONARY: Effort normal and breath sounds normal. No respiratory distress. ABDOMINAL: There is no tenderness or distention. MUSCULOSKELETAL: Normal gait. Without gross motor or sensory deficit. PSYCHIATRIC: Patient has a normal mood and  affect. behavior is normal. Judgment and thought content normal.  Vitals:   01/29/24 0800  BP: 128/76  Pulse: 82  Resp: 16  SpO2: 97%  Weight: (!) 333 lb (151 kg)  Height: 5' 11 (1.803 m)    Body mass index is 46.44 kg/m.  Recent Results (from the past 2160 hours)  Hepatitis B Surface AntiBODY     Status: None   Collection Time: 11/08/23  8:17 AM  Result Value Ref Range   Hep B S Ab NON-REACTIVE NON-REACTIVE  CBC with Differential/Platelet     Status: None   Collection Time: 11/08/23  8:17 AM  Result Value Ref Range   WBC 7.3 3.8 - 10.8 Thousand/uL   RBC 5.48 4.20 - 5.80 Million/uL   Hemoglobin 15.3 13.2 - 17.1 g/dL   HCT 52.6 61.4 - 49.9 %   MCV 86.3 80.0 - 100.0 fL   MCH 27.9 27.0 - 33.0 pg   MCHC 32.3 32.0 - 36.0 g/dL    Comment: For adults, a slight decrease in the calculated MCHC value (in the range of 30 to 32 g/dL) is most likely not clinically significant; however, it should be interpreted with caution in  correlation with other red cell parameters and the patient's clinical condition.    RDW 13.3 11.0 - 15.0 %   Platelets 212 140 - 400 Thousand/uL   MPV 10.9 7.5 - 12.5 fL   Neutro Abs 4,621 1,500 - 7,800 cells/uL   Absolute Lymphocytes 1,759 850 - 3,900 cells/uL   Absolute Monocytes 803 200 - 950 cells/uL   Eosinophils Absolute 37 15 - 500 cells/uL   Basophils Absolute 80 0 - 200 cells/uL   Neutrophils Relative % 63.3 %   Total Lymphocyte 24.1 %   Monocytes Relative 11.0 %   Eosinophils Relative 0.5 %   Basophils Relative 1.1 %  Comprehensive metabolic panel with GFR     Status: Abnormal   Collection Time: 11/08/23  8:17 AM  Result Value Ref Range   Glucose, Bld 102 (H) 65 - 99 mg/dL    Comment: .            Fasting reference interval . For someone without known diabetes, a glucose value between 100 and 125 mg/dL is consistent with prediabetes and should be confirmed with a follow-up test. .    BUN 15 7 - 25 mg/dL   Creat 8.90 9.29 - 8.69 mg/dL    eGFR 80 > OR = 60 fO/fpw/8.26f7   BUN/Creatinine Ratio SEE NOTE: 6 - 22 (calc)    Comment:    Not Reported: BUN and Creatinine are within    reference range. .    Sodium 139 135 - 146 mmol/L   Potassium 4.2 3.5 - 5.3 mmol/L   Chloride 102 98 - 110 mmol/L   CO2 28 20 - 32 mmol/L   Calcium  10.1 8.6 - 10.3 mg/dL   Total Protein 7.1 6.1 - 8.1 g/dL   Albumin 4.5 3.6 - 5.1 g/dL   Globulin 2.6 1.9 - 3.7 g/dL (calc)   AG Ratio 1.7 1.0 - 2.5 (calc)   Total Bilirubin 0.4 0.2 - 1.2 mg/dL   Alkaline phosphatase (APISO) 75 35 - 144 U/L   AST 16 10 - 35 U/L   ALT 22 9 - 46 U/L  Lipid panel     Status: None   Collection Time: 11/08/23  8:17 AM  Result Value Ref Range   Cholesterol 154 <200 mg/dL   HDL 51 > OR = 40 mg/dL   Triglycerides 91 <849 mg/dL   LDL Cholesterol (Calc) 84 mg/dL (calc)    Comment: Reference range: <100 . Desirable range <100 mg/dL for primary prevention;   <70 mg/dL for patients with CHD or diabetic patients  with > or = 2 CHD risk factors. SABRA LDL-C is now calculated using the Martin-Hopkins  calculation, which is a validated novel method providing  better accuracy than the Friedewald equation in the  estimation of LDL-C.  Gladis APPLETHWAITE et al. SANDREA. 7986;689(80): 2061-2068  (http://education.[facility].com/faq/FAQ164)    Total CHOL/HDL Ratio 3.0 <5.0 (calc)   Non-HDL Cholesterol (Calc) 103 <130 mg/dL (calc)    Comment: For patients with diabetes plus 1 major ASCVD risk  factor, treating to a non-HDL-C goal of <100 mg/dL  (LDL-C of <29 mg/dL) is considered a therapeutic  option.   Hemoglobin A1c     Status: Abnormal   Collection Time: 11/08/23  8:17 AM  Result Value Ref Range   Hgb A1c MFr Bld 5.8 (H) <5.7 %    Comment: For someone without known diabetes, a hemoglobin  A1c value between 5.7% and 6.4% is consistent with prediabetes and should be confirmed with a  follow-up test. . For someone with known diabetes, a value <7% indicates that their  diabetes is well controlled. A1c targets should be individualized based on duration of diabetes, age, comorbid conditions, and other considerations. . This assay result is consistent with an increased risk of diabetes. . Currently, no consensus exists regarding use of hemoglobin A1c for diagnosis of diabetes for children. .    Mean Plasma Glucose 120 mg/dL   eAG (mmol/L) 6.6 mmol/L  Iron, TIBC and Ferritin Panel     Status: Abnormal   Collection Time: 11/08/23  8:17 AM  Result Value Ref Range   Iron 60 50 - 180 mcg/dL   TIBC 675 749 - 574 mcg/dL (calc)   %SAT 19 (L) 20 - 48 % (calc)   Ferritin 147 38 - 380 ng/mL  PSA     Status: None   Collection Time: 11/08/23  8:17 AM  Result Value Ref Range   PSA 0.55 < OR = 4.00 ng/mL    Comment: The total PSA value from this assay system is  standardized against the WHO standard. The test  result will be approximately 20% lower when compared  to the equimolar-standardized total PSA (Beckman  Coulter). Comparison of serial PSA results should be  interpreted with this fact in mind. . This test was performed using the Siemens  chemiluminescent method. Values obtained from  different assay methods cannot be used interchangeably. PSA levels, regardless of value, should not be interpreted as absolute evidence of the presence or absence of disease.     Diabetic Foot Exam:     PHQ2/9:    01/29/2024    7:57 AM 10/27/2023    8:08 AM 04/28/2023    7:58 AM 01/27/2023    8:00 AM 11/04/2022    8:32 AM  Depression screen PHQ 2/9  Decreased Interest 0 0 0 0 0  Down, Depressed, Hopeless 0 0 0 0 0  PHQ - 2 Score 0 0 0 0 0  Altered sleeping   0 0 0  Tired, decreased energy   0 0 0  Change in appetite   0 0 0  Feeling bad or failure about yourself    0 0 0  Trouble concentrating   0 0 0  Moving slowly or fidgety/restless   0 0 0  Suicidal thoughts   0 0 0  PHQ-9 Score   0  0  0   Difficult doing work/chores   Not difficult at all Not  difficult at all      Data saved with a previous flowsheet row definition    phq 9 is negative  Fall Risk:    01/29/2024    7:57 AM 10/27/2023    8:08 AM 01/27/2023    8:00 AM 11/04/2022    8:32 AM 04/29/2022   10:08 AM  Fall Risk   Falls in the past year? 0 0 0 0 0  Number falls in past yr: 0 0 0    Injury with Fall? 0 0  0     Risk for fall due to : No Fall Risks No Fall Risks No Fall Risks No Fall Risks No Fall Risks  Follow up Falls evaluation completed Falls evaluation completed Falls prevention discussed;Education provided;Falls evaluation completed Falls prevention discussed Falls prevention discussed     Data saved with a previous flowsheet row definition     Assessment & Plan Morbid obesity Significant weight loss with Qsymia , but recent weight increase. No Qsymia  side effects. Blood  pressure controlled. - Continue Qsymia  7.5 mg daily with two refills for three months. - Monitor weight and blood pressure regularly. - Encouraged continued physical activity and healthy eating habits.  Essential hypertension Blood pressure controlled. No chest pain or palpitations. - Continue  Benicar  hydrochlorothiazide  40/25 mg daily. - Monitor blood pressure at home, especially when starting Qsymia . - Adjust blood pressure medication if necessary based on home readings.  Pure hypercholesterolemia Lipid panel normal, LDL below 100 mg/dL. - Continue rosuvastatin  10 mg daily.  Insulin  resistance A1c was 5.8%, goal to keep below 5.6%. Weight loss expected to improve insulin  resistance. - Continue weight loss efforts to improve insulin  resistance.  General health maintenance Hepatitis B vaccination needed due to lack of immunity. - Administered hepatitis B vaccine today. - Schedule follow-up in three months for second hepatitis B vaccine dose.        [1]  Current Outpatient Medications:    Cholecalciferol (VITAMIN D ) 50 MCG (2000 UT) CAPS, Take 1 capsule (2,000 Units total)  by mouth daily., Disp: 30 capsule, Rfl: 11   Multiple Vitamin (MULTIVITAMIN WITH MINERALS) TABS tablet, Take 1 tablet by mouth daily at 6 (six) AM., Disp: , Rfl:    olmesartan -hydrochlorothiazide (BENICAR  HCT) 40-25 MG tablet, Take 1 tablet by mouth daily. In place of micardis  hctz due to insurance, Disp: 90 tablet, Rfl: 1   Phentermine -Topiramate  ER (QSYMIA ) 3.75-23 MG CP24, Take 1 capsule by mouth in the morning., Disp: 30 capsule, Rfl: 0   Phentermine -Topiramate  ER (QSYMIA ) 7.5-46 MG CP24, Take 1 capsule by mouth in the morning., Disp: 30 capsule, Rfl: 1   rosuvastatin  (CRESTOR ) 10 MG tablet, Take 1 tablet (10 mg total) by mouth daily., Disp: 90 tablet, Rfl: 1   sildenafil  (VIAGRA ) 100 MG tablet, Take 0.5-1 tablets (50-100 mg total) by mouth daily as needed for erectile dysfunction., Disp: 30 tablet, Rfl: 1 [2]  Allergies Allergen Reactions   Cialis  [Tadalafil ] Other (See Comments)    Joint aches    Metformin  And Related Other (See Comments)    Caused recurrence of hematuria

## 2024-03-15 ENCOUNTER — Encounter: Admitting: Family Medicine

## 2024-04-30 ENCOUNTER — Ambulatory Visit: Admitting: Family Medicine
